# Patient Record
Sex: Female | Born: 1935 | Race: White | Hispanic: No | Smoking: Never smoker
Health system: Southern US, Community
[De-identification: ages and names within clinical notes are randomized; demographics above are authoritative.]

## PROBLEM LIST (undated history)

## (undated) DIAGNOSIS — A0472 Enterocolitis due to Clostridium difficile, not specified as recurrent: Secondary | ICD-10-CM

## (undated) DIAGNOSIS — E876 Hypokalemia: Secondary | ICD-10-CM

## (undated) DIAGNOSIS — R413 Other amnesia: Secondary | ICD-10-CM

## (undated) DIAGNOSIS — D518 Other vitamin B12 deficiency anemias: Secondary | ICD-10-CM

## (undated) DIAGNOSIS — B372 Candidiasis of skin and nail: Secondary | ICD-10-CM

## (undated) DIAGNOSIS — K5900 Constipation, unspecified: Secondary | ICD-10-CM

## (undated) DIAGNOSIS — R259 Unspecified abnormal involuntary movements: Secondary | ICD-10-CM

## (undated) DIAGNOSIS — S32030A Wedge compression fracture of third lumbar vertebra, initial encounter for closed fracture: Secondary | ICD-10-CM

## (undated) DIAGNOSIS — S32000A Wedge compression fracture of unspecified lumbar vertebra, initial encounter for closed fracture: Secondary | ICD-10-CM

## (undated) DIAGNOSIS — D126 Benign neoplasm of colon, unspecified: Secondary | ICD-10-CM

## (undated) DIAGNOSIS — D509 Iron deficiency anemia, unspecified: Secondary | ICD-10-CM

## (undated) DIAGNOSIS — H9190 Unspecified hearing loss, unspecified ear: Secondary | ICD-10-CM

## (undated) DIAGNOSIS — K219 Gastro-esophageal reflux disease without esophagitis: Secondary | ICD-10-CM

## (undated) DIAGNOSIS — K649 Unspecified hemorrhoids: Secondary | ICD-10-CM

## (undated) DIAGNOSIS — I10 Essential (primary) hypertension: Secondary | ICD-10-CM

## (undated) DIAGNOSIS — I442 Atrioventricular block, complete: Secondary | ICD-10-CM

## (undated) DIAGNOSIS — L409 Psoriasis, unspecified: Secondary | ICD-10-CM

## (undated) DIAGNOSIS — M899 Disorder of bone, unspecified: Secondary | ICD-10-CM

## (undated) DIAGNOSIS — M1990 Unspecified osteoarthritis, unspecified site: Secondary | ICD-10-CM

## (undated) DIAGNOSIS — Z8781 Personal history of (healed) traumatic fracture: Secondary | ICD-10-CM

## (undated) DIAGNOSIS — K317 Polyp of stomach and duodenum: Secondary | ICD-10-CM

## (undated) DIAGNOSIS — S335XXA Sprain of ligaments of lumbar spine, initial encounter: Secondary | ICD-10-CM

## (undated) DIAGNOSIS — R350 Frequency of micturition: Secondary | ICD-10-CM

## (undated) DIAGNOSIS — E785 Hyperlipidemia, unspecified: Secondary | ICD-10-CM

## (undated) DIAGNOSIS — M949 Disorder of cartilage, unspecified: Secondary | ICD-10-CM

## (undated) DIAGNOSIS — F419 Anxiety disorder, unspecified: Secondary | ICD-10-CM

## (undated) DIAGNOSIS — R609 Edema, unspecified: Secondary | ICD-10-CM

## (undated) DIAGNOSIS — K921 Melena: Secondary | ICD-10-CM

## (undated) HISTORY — DX: Psoriasis, unspecified: L40.9

## (undated) HISTORY — DX: Atrioventricular block, complete: I44.2

## (undated) HISTORY — PX: BREAST CYST ASPIRATION: SHX578

## (undated) HISTORY — DX: Hyperlipidemia, unspecified: E78.5

## (undated) HISTORY — DX: Disorder of bone, unspecified: M89.9

## (undated) HISTORY — DX: Other amnesia: R41.3

## (undated) HISTORY — DX: Anxiety disorder, unspecified: F41.9

## (undated) HISTORY — DX: Unspecified hearing loss, unspecified ear: H91.90

## (undated) HISTORY — DX: Hypokalemia: E87.6

## (undated) HISTORY — DX: Essential (primary) hypertension: I10

## (undated) HISTORY — DX: Iron deficiency anemia, unspecified: D50.9

## (undated) HISTORY — DX: Enterocolitis due to Clostridium difficile, not specified as recurrent: A04.72

## (undated) HISTORY — DX: Unspecified osteoarthritis, unspecified site: M19.90

## (undated) HISTORY — DX: Constipation, unspecified: K59.00

## (undated) HISTORY — DX: Benign neoplasm of colon, unspecified: D12.6

## (undated) HISTORY — DX: Edema, unspecified: R60.9

## (undated) HISTORY — PX: CATARACT EXTRACTION, BILATERAL: SHX1313

## (undated) HISTORY — DX: Candidiasis of skin and nail: B37.2

## (undated) HISTORY — DX: Unspecified abnormal involuntary movements: R25.9

## (undated) HISTORY — DX: Melena: K92.1

## (undated) HISTORY — PX: DILATION AND CURETTAGE OF UTERUS: SHX78

## (undated) HISTORY — DX: Wedge compression fracture of third lumbar vertebra, initial encounter for closed fracture: S32.030A

## (undated) HISTORY — DX: Other vitamin B12 deficiency anemias: D51.8

## (undated) HISTORY — DX: Personal history of (healed) traumatic fracture: Z87.81

## (undated) HISTORY — DX: Unspecified hemorrhoids: K64.9

## (undated) HISTORY — DX: Disorder of cartilage, unspecified: M94.9

## (undated) HISTORY — DX: Sprain of ligaments of lumbar spine, initial encounter: S33.5XXA

## (undated) HISTORY — DX: Polyp of stomach and duodenum: K31.7

## (undated) HISTORY — DX: Gastro-esophageal reflux disease without esophagitis: K21.9

## (undated) HISTORY — DX: Frequency of micturition: R35.0

---

## 1972-12-16 HISTORY — PX: ABDOMINAL HYSTERECTOMY: SHX81

## 1999-02-20 ENCOUNTER — Other Ambulatory Visit: Admission: RE | Admit: 1999-02-20 | Discharge: 1999-02-20 | Payer: Self-pay | Admitting: Family Medicine

## 1999-12-14 ENCOUNTER — Encounter: Admission: RE | Admit: 1999-12-14 | Discharge: 1999-12-14 | Payer: Self-pay | Admitting: Family Medicine

## 1999-12-14 ENCOUNTER — Encounter: Payer: Self-pay | Admitting: Family Medicine

## 2000-03-20 ENCOUNTER — Other Ambulatory Visit: Admission: RE | Admit: 2000-03-20 | Discharge: 2000-03-20 | Payer: Self-pay | Admitting: Family Medicine

## 2000-06-19 ENCOUNTER — Encounter: Payer: Self-pay | Admitting: Family Medicine

## 2000-06-19 ENCOUNTER — Encounter: Admission: RE | Admit: 2000-06-19 | Discharge: 2000-06-19 | Payer: Self-pay | Admitting: Family Medicine

## 2000-06-23 ENCOUNTER — Encounter: Admission: RE | Admit: 2000-06-23 | Discharge: 2000-06-23 | Payer: Self-pay | Admitting: Family Medicine

## 2000-06-23 ENCOUNTER — Encounter: Payer: Self-pay | Admitting: Family Medicine

## 2001-03-25 ENCOUNTER — Other Ambulatory Visit: Admission: RE | Admit: 2001-03-25 | Discharge: 2001-03-25 | Payer: Self-pay | Admitting: Family Medicine

## 2001-04-02 ENCOUNTER — Encounter: Admission: RE | Admit: 2001-04-02 | Discharge: 2001-04-02 | Payer: Self-pay | Admitting: Family Medicine

## 2001-04-02 ENCOUNTER — Encounter: Payer: Self-pay | Admitting: Family Medicine

## 2001-09-22 ENCOUNTER — Encounter: Payer: Self-pay | Admitting: Family Medicine

## 2001-09-22 ENCOUNTER — Encounter: Admission: RE | Admit: 2001-09-22 | Discharge: 2001-09-22 | Payer: Self-pay | Admitting: Family Medicine

## 2001-11-25 ENCOUNTER — Encounter: Admission: RE | Admit: 2001-11-25 | Discharge: 2001-11-25 | Payer: Self-pay | Admitting: Orthopedic Surgery

## 2001-11-25 ENCOUNTER — Encounter: Payer: Self-pay | Admitting: Orthopedic Surgery

## 2001-12-10 ENCOUNTER — Encounter: Admission: RE | Admit: 2001-12-10 | Discharge: 2002-01-29 | Payer: Self-pay | Admitting: Orthopedic Surgery

## 2001-12-16 LAB — HM COLONOSCOPY

## 2002-03-31 ENCOUNTER — Other Ambulatory Visit: Admission: RE | Admit: 2002-03-31 | Discharge: 2002-03-31 | Payer: Self-pay | Admitting: Family Medicine

## 2002-09-28 ENCOUNTER — Encounter: Payer: Self-pay | Admitting: Family Medicine

## 2002-09-28 ENCOUNTER — Encounter: Admission: RE | Admit: 2002-09-28 | Discharge: 2002-09-28 | Payer: Self-pay | Admitting: Family Medicine

## 2003-04-05 ENCOUNTER — Other Ambulatory Visit: Admission: RE | Admit: 2003-04-05 | Discharge: 2003-04-05 | Payer: Self-pay | Admitting: Family Medicine

## 2003-10-04 ENCOUNTER — Encounter: Admission: RE | Admit: 2003-10-04 | Discharge: 2003-10-04 | Payer: Self-pay | Admitting: Family Medicine

## 2003-10-04 ENCOUNTER — Encounter: Payer: Self-pay | Admitting: Family Medicine

## 2004-04-10 ENCOUNTER — Other Ambulatory Visit: Admission: RE | Admit: 2004-04-10 | Discharge: 2004-04-10 | Payer: Self-pay | Admitting: Family Medicine

## 2004-10-23 ENCOUNTER — Encounter: Admission: RE | Admit: 2004-10-23 | Discharge: 2004-10-23 | Payer: Self-pay | Admitting: Family Medicine

## 2004-10-25 ENCOUNTER — Encounter: Admission: RE | Admit: 2004-10-25 | Discharge: 2004-10-25 | Payer: Self-pay | Admitting: Family Medicine

## 2005-05-06 ENCOUNTER — Other Ambulatory Visit: Admission: RE | Admit: 2005-05-06 | Discharge: 2005-05-06 | Payer: Self-pay | Admitting: Family Medicine

## 2005-10-24 ENCOUNTER — Encounter: Admission: RE | Admit: 2005-10-24 | Discharge: 2005-10-24 | Payer: Self-pay | Admitting: Family Medicine

## 2005-12-16 HISTORY — PX: OTHER SURGICAL HISTORY: SHX169

## 2006-05-07 ENCOUNTER — Other Ambulatory Visit: Admission: RE | Admit: 2006-05-07 | Discharge: 2006-05-07 | Payer: Self-pay | Admitting: Family Medicine

## 2006-09-05 ENCOUNTER — Ambulatory Visit: Payer: Self-pay | Admitting: Internal Medicine

## 2006-10-29 ENCOUNTER — Ambulatory Visit: Payer: Self-pay | Admitting: Internal Medicine

## 2006-11-11 ENCOUNTER — Ambulatory Visit: Payer: Self-pay | Admitting: Internal Medicine

## 2006-11-12 ENCOUNTER — Encounter: Admission: RE | Admit: 2006-11-12 | Discharge: 2006-11-12 | Payer: Self-pay | Admitting: Internal Medicine

## 2006-11-18 ENCOUNTER — Encounter: Admission: RE | Admit: 2006-11-18 | Discharge: 2006-11-18 | Payer: Self-pay | Admitting: Internal Medicine

## 2006-11-18 ENCOUNTER — Encounter: Payer: Self-pay | Admitting: Internal Medicine

## 2006-11-26 ENCOUNTER — Ambulatory Visit: Payer: Self-pay | Admitting: Internal Medicine

## 2007-05-14 ENCOUNTER — Encounter: Payer: Self-pay | Admitting: Internal Medicine

## 2007-05-14 DIAGNOSIS — M949 Disorder of cartilage, unspecified: Secondary | ICD-10-CM

## 2007-05-14 DIAGNOSIS — M899 Disorder of bone, unspecified: Secondary | ICD-10-CM

## 2007-05-14 DIAGNOSIS — M1990 Unspecified osteoarthritis, unspecified site: Secondary | ICD-10-CM

## 2007-05-14 DIAGNOSIS — I10 Essential (primary) hypertension: Secondary | ICD-10-CM | POA: Insufficient documentation

## 2007-05-14 HISTORY — DX: Essential (primary) hypertension: I10

## 2007-05-14 HISTORY — DX: Unspecified osteoarthritis, unspecified site: M19.90

## 2007-05-14 HISTORY — DX: Disorder of bone, unspecified: M89.9

## 2007-07-08 ENCOUNTER — Ambulatory Visit: Payer: Self-pay | Admitting: Internal Medicine

## 2007-07-08 DIAGNOSIS — R259 Unspecified abnormal involuntary movements: Secondary | ICD-10-CM

## 2007-07-08 HISTORY — DX: Unspecified abnormal involuntary movements: R25.9

## 2007-07-08 LAB — CONVERTED CEMR LAB: Vit D, 1,25-Dihydroxy: 29 (ref 20–57)

## 2007-07-10 ENCOUNTER — Encounter: Payer: Self-pay | Admitting: Internal Medicine

## 2007-07-10 LAB — CONVERTED CEMR LAB
BUN: 15 mg/dL (ref 6–23)
Basophils Absolute: 0 10*3/uL (ref 0.0–0.1)
Basophils Relative: 0.3 % (ref 0.0–1.0)
CO2: 35 meq/L — ABNORMAL HIGH (ref 19–32)
Calcium: 9.6 mg/dL (ref 8.4–10.5)
Chloride: 100 meq/L (ref 96–112)
Cholesterol: 202 mg/dL (ref 0–200)
Creatinine, Ser: 0.6 mg/dL (ref 0.4–1.2)
Direct LDL: 126.3 mg/dL
Eosinophils Absolute: 0.2 10*3/uL (ref 0.0–0.6)
Eosinophils Relative: 2 % (ref 0.0–5.0)
GFR calc Af Amer: 124 mL/min
GFR calc non Af Amer: 102 mL/min
Glucose, Bld: 74 mg/dL (ref 70–99)
HCT: 42.8 % (ref 36.0–46.0)
HDL: 50.2 mg/dL (ref >39.0)
Hemoglobin: 14.5 g/dL (ref 12.0–15.0)
Lymphocytes Relative: 20.1 % (ref 12.0–46.0)
MCHC: 33.9 g/dL (ref 30.0–36.0)
MCV: 89.7 fL (ref 78.0–100.0)
Monocytes Absolute: 0.7 10*3/uL (ref 0.2–0.7)
Monocytes Relative: 8.7 % (ref 3.0–11.0)
Neutro Abs: 5.3 10*3/uL (ref 1.4–7.7)
Neutrophils Relative %: 68.9 % (ref 43.0–77.0)
Platelets: 212 10*3/uL (ref 150–400)
Potassium: 2.9 meq/L — ABNORMAL LOW (ref 3.5–5.1)
RBC: 4.78 M/uL (ref 3.87–5.11)
RDW: 12 % (ref 11.5–14.6)
Sodium: 143 meq/L (ref 135–145)
TSH: 0.8 microintl units/mL (ref 0.35–5.50)
Total CHOL/HDL Ratio: 4
Triglycerides: 197 mg/dL — ABNORMAL HIGH (ref 0–149)
VLDL: 39 mg/dL (ref 0–40)
WBC: 7.7 10*3/uL (ref 4.5–10.5)

## 2007-07-15 ENCOUNTER — Telehealth: Payer: Self-pay | Admitting: *Deleted

## 2007-07-16 ENCOUNTER — Telehealth: Payer: Self-pay | Admitting: *Deleted

## 2007-07-24 ENCOUNTER — Telehealth: Payer: Self-pay | Admitting: Internal Medicine

## 2007-08-13 ENCOUNTER — Ambulatory Visit: Payer: Self-pay | Admitting: Internal Medicine

## 2007-08-17 ENCOUNTER — Encounter: Payer: Self-pay | Admitting: Internal Medicine

## 2007-08-17 DIAGNOSIS — E876 Hypokalemia: Secondary | ICD-10-CM

## 2007-08-17 HISTORY — DX: Hypokalemia: E87.6

## 2007-08-18 LAB — CONVERTED CEMR LAB
BUN: 16 mg/dL (ref 6–23)
CO2: 33 meq/L — ABNORMAL HIGH (ref 19–32)
Calcium: 9.9 mg/dL (ref 8.4–10.5)
Chloride: 100 meq/L (ref 96–112)
Creatinine, Ser: 0.9 mg/dL (ref 0.4–1.2)
GFR calc Af Amer: 78 mL/min
GFR calc non Af Amer: 64 mL/min
Glucose, Bld: 108 mg/dL — ABNORMAL HIGH (ref 70–99)
Potassium: 3.3 meq/L — ABNORMAL LOW (ref 3.5–5.1)
Sodium: 139 meq/L (ref 135–145)

## 2007-09-17 ENCOUNTER — Ambulatory Visit: Payer: Self-pay | Admitting: Internal Medicine

## 2007-09-21 ENCOUNTER — Ambulatory Visit: Payer: Self-pay | Admitting: Family Medicine

## 2007-09-21 DIAGNOSIS — S335XXA Sprain of ligaments of lumbar spine, initial encounter: Secondary | ICD-10-CM

## 2007-09-21 HISTORY — DX: Sprain of ligaments of lumbar spine, initial encounter: S33.5XXA

## 2007-09-28 LAB — CONVERTED CEMR LAB
BUN: 11 mg/dL (ref 6–23)
CO2: 32 meq/L (ref 19–32)
Calcium: 9.4 mg/dL (ref 8.4–10.5)
Chloride: 102 meq/L (ref 96–112)
Creatinine, Ser: 0.9 mg/dL (ref 0.4–1.2)
GFR calc Af Amer: 78 mL/min
GFR calc non Af Amer: 64 mL/min
Glucose, Bld: 91 mg/dL (ref 70–99)
Magnesium: 1.9 mg/dL (ref 1.5–2.5)
Potassium: 3.6 meq/L (ref 3.5–5.1)
Sodium: 141 meq/L (ref 135–145)

## 2007-11-24 ENCOUNTER — Encounter: Admission: RE | Admit: 2007-11-24 | Discharge: 2007-11-24 | Payer: Self-pay | Admitting: Internal Medicine

## 2008-01-04 ENCOUNTER — Telehealth: Payer: Self-pay | Admitting: Internal Medicine

## 2008-01-06 ENCOUNTER — Ambulatory Visit: Payer: Self-pay | Admitting: Internal Medicine

## 2008-01-06 LAB — CONVERTED CEMR LAB: Vit D, 1,25-Dihydroxy: 36 (ref 30–89)

## 2008-01-11 LAB — CONVERTED CEMR LAB
BUN: 18 mg/dL (ref 6–23)
Basophils Absolute: 0.1 10*3/uL (ref 0.0–0.1)
Basophils Relative: 0.9 % (ref 0.0–1.0)
CO2: 32 meq/L (ref 19–32)
Calcium: 9.8 mg/dL (ref 8.4–10.5)
Chloride: 100 meq/L (ref 96–112)
Creatinine, Ser: 0.8 mg/dL (ref 0.4–1.2)
Eosinophils Absolute: 0.2 10*3/uL (ref 0.0–0.6)
Eosinophils Relative: 2.6 % (ref 0.0–5.0)
GFR calc Af Amer: 89 mL/min
GFR calc non Af Amer: 74 mL/min
Glucose, Bld: 77 mg/dL (ref 70–99)
HCT: 40 % (ref 36.0–46.0)
Hemoglobin: 13.9 g/dL (ref 12.0–15.0)
Lymphocytes Relative: 21.4 % (ref 12.0–46.0)
MCHC: 34.8 g/dL (ref 30.0–36.0)
MCV: 88.8 fL (ref 78.0–100.0)
Monocytes Absolute: 0.7 10*3/uL (ref 0.2–0.7)
Monocytes Relative: 10.1 % (ref 3.0–11.0)
Neutro Abs: 4.5 10*3/uL (ref 1.4–7.7)
Neutrophils Relative %: 65 % (ref 43.0–77.0)
Platelets: 182 10*3/uL (ref 150–400)
Potassium: 4.5 meq/L (ref 3.5–5.1)
RBC: 4.51 M/uL (ref 3.87–5.11)
RDW: 11.9 % (ref 11.5–14.6)
Sodium: 140 meq/L (ref 135–145)
Vitamin B-12: 217 pg/mL (ref 211–911)
WBC: 7 10*3/uL (ref 4.5–10.5)

## 2008-01-25 ENCOUNTER — Ambulatory Visit: Payer: Self-pay | Admitting: Internal Medicine

## 2008-01-25 DIAGNOSIS — K921 Melena: Secondary | ICD-10-CM | POA: Insufficient documentation

## 2008-01-25 HISTORY — DX: Melena: K92.1

## 2008-02-19 ENCOUNTER — Telehealth: Payer: Self-pay | Admitting: Internal Medicine

## 2008-02-29 ENCOUNTER — Ambulatory Visit: Payer: Self-pay | Admitting: Internal Medicine

## 2008-03-08 ENCOUNTER — Telehealth: Payer: Self-pay | Admitting: Internal Medicine

## 2008-03-08 ENCOUNTER — Ambulatory Visit: Payer: Self-pay | Admitting: Internal Medicine

## 2008-04-15 ENCOUNTER — Ambulatory Visit: Payer: Self-pay | Admitting: Internal Medicine

## 2008-04-22 ENCOUNTER — Ambulatory Visit (HOSPITAL_COMMUNITY): Admission: RE | Admit: 2008-04-22 | Discharge: 2008-04-22 | Payer: Self-pay | Admitting: Internal Medicine

## 2008-04-22 ENCOUNTER — Encounter: Payer: Self-pay | Admitting: Internal Medicine

## 2008-04-26 ENCOUNTER — Ambulatory Visit: Payer: Self-pay | Admitting: Internal Medicine

## 2008-04-26 DIAGNOSIS — R110 Nausea: Secondary | ICD-10-CM | POA: Insufficient documentation

## 2008-04-26 LAB — CONVERTED CEMR LAB: Vit D, 1,25-Dihydroxy: 46 (ref 30–89)

## 2008-04-28 ENCOUNTER — Ambulatory Visit: Payer: Self-pay | Admitting: Internal Medicine

## 2008-04-28 LAB — CONVERTED CEMR LAB
BUN: 15 mg/dL (ref 6–23)
CO2: 35 meq/L — ABNORMAL HIGH (ref 19–32)
Calcium: 9.6 mg/dL (ref 8.4–10.5)
Chloride: 105 meq/L (ref 96–112)
Creatinine, Ser: 0.9 mg/dL (ref 0.4–1.2)
GFR calc Af Amer: 77 mL/min
GFR calc non Af Amer: 64 mL/min
Glucose, Bld: 97 mg/dL (ref 70–99)
Potassium: 3.8 meq/L (ref 3.5–5.1)
Sodium: 143 meq/L (ref 135–145)

## 2008-08-19 ENCOUNTER — Ambulatory Visit: Payer: Self-pay | Admitting: Internal Medicine

## 2008-08-19 DIAGNOSIS — R350 Frequency of micturition: Secondary | ICD-10-CM | POA: Insufficient documentation

## 2008-08-19 HISTORY — DX: Frequency of micturition: R35.0

## 2008-08-19 LAB — CONVERTED CEMR LAB
Bilirubin Urine: NEGATIVE
Blood in Urine, dipstick: NEGATIVE
Glucose, Urine, Semiquant: NEGATIVE
Ketones, urine, test strip: NEGATIVE
Nitrite: NEGATIVE
Protein, U semiquant: NEGATIVE
Specific Gravity, Urine: 1.02
Urobilinogen, UA: 0.2
WBC Urine, dipstick: NEGATIVE
pH: 7

## 2008-08-23 LAB — CONVERTED CEMR LAB
BUN: 18 mg/dL (ref 6–23)
CO2: 35 meq/L — ABNORMAL HIGH (ref 19–32)
Calcium: 9.6 mg/dL (ref 8.4–10.5)
Chloride: 104 meq/L (ref 96–112)
Creatinine, Ser: 0.9 mg/dL (ref 0.4–1.2)
GFR calc Af Amer: 77 mL/min
GFR calc non Af Amer: 64 mL/min
Glucose, Bld: 90 mg/dL (ref 70–99)
Potassium: 3.8 meq/L (ref 3.5–5.1)
Sodium: 143 meq/L (ref 135–145)
TSH: 1.01 microintl units/mL (ref 0.35–5.50)

## 2008-09-27 ENCOUNTER — Ambulatory Visit: Payer: Self-pay | Admitting: Internal Medicine

## 2008-11-24 ENCOUNTER — Encounter: Admission: RE | Admit: 2008-11-24 | Discharge: 2008-11-24 | Payer: Self-pay | Admitting: Internal Medicine

## 2008-11-25 ENCOUNTER — Telehealth: Payer: Self-pay | Admitting: Family Medicine

## 2008-12-19 ENCOUNTER — Telehealth: Payer: Self-pay | Admitting: Internal Medicine

## 2008-12-21 ENCOUNTER — Ambulatory Visit: Payer: Self-pay | Admitting: Internal Medicine

## 2008-12-23 LAB — CONVERTED CEMR LAB
BUN: 18 mg/dL (ref 6–23)
CO2: 34 meq/L — ABNORMAL HIGH (ref 19–32)
Calcium: 9.9 mg/dL (ref 8.4–10.5)
Chloride: 100 meq/L (ref 96–112)
Creatinine, Ser: 1 mg/dL (ref 0.4–1.2)
GFR calc Af Amer: 69 mL/min
GFR calc non Af Amer: 57 mL/min
Glucose, Bld: 105 mg/dL — ABNORMAL HIGH (ref 70–99)
Potassium: 3.9 meq/L (ref 3.5–5.1)
Sodium: 140 meq/L (ref 135–145)

## 2009-02-21 ENCOUNTER — Ambulatory Visit: Payer: Self-pay | Admitting: Internal Medicine

## 2009-02-21 DIAGNOSIS — E785 Hyperlipidemia, unspecified: Secondary | ICD-10-CM

## 2009-02-21 HISTORY — DX: Hyperlipidemia, unspecified: E78.5

## 2009-03-09 LAB — CONVERTED CEMR LAB
ALT: 19 units/L (ref 0–35)
AST: 21 units/L (ref 0–37)
Albumin: 3.7 g/dL (ref 3.5–5.2)
Alkaline Phosphatase: 48 units/L (ref 39–117)
BUN: 18 mg/dL (ref 6–23)
Basophils Absolute: 0.1 10*3/uL (ref 0.0–0.1)
Basophils Relative: 0.8 % (ref 0.0–3.0)
Bilirubin, Direct: 0.1 mg/dL (ref 0.0–0.3)
CO2: 33 meq/L — ABNORMAL HIGH (ref 19–32)
Calcium: 9.6 mg/dL (ref 8.4–10.5)
Chloride: 102 meq/L (ref 96–112)
Cholesterol: 223 mg/dL (ref 0–200)
Creatinine, Ser: 1 mg/dL (ref 0.4–1.2)
Direct LDL: 137.7 mg/dL
Eosinophils Absolute: 0.3 10*3/uL (ref 0.0–0.7)
Eosinophils Relative: 3.7 % (ref 0.0–5.0)
Folate: 12.5 ng/mL
GFR calc Af Amer: 69 mL/min
GFR calc non Af Amer: 57 mL/min
Glucose, Bld: 98 mg/dL (ref 70–99)
HCT: 38.7 % (ref 36.0–46.0)
HDL: 55.6 mg/dL (ref >39.0)
Hemoglobin: 13.3 g/dL (ref 12.0–15.0)
Lymphocytes Relative: 21.2 % (ref 12.0–46.0)
MCHC: 34.4 g/dL (ref 30.0–36.0)
MCV: 88.6 fL (ref 78.0–100.0)
Monocytes Absolute: 0.6 10*3/uL (ref 0.1–1.0)
Monocytes Relative: 9.1 % (ref 3.0–12.0)
Neutro Abs: 4.4 10*3/uL (ref 1.4–7.7)
Neutrophils Relative %: 65.2 % (ref 43.0–77.0)
Platelets: 179 10*3/uL (ref 150–400)
Potassium: 3.5 meq/L (ref 3.5–5.1)
RBC: 4.37 M/uL (ref 3.87–5.11)
RDW: 12.1 % (ref 11.5–14.6)
Sodium: 143 meq/L (ref 135–145)
TSH: 1.38 microintl units/mL (ref 0.35–5.50)
Total Bilirubin: 0.8 mg/dL (ref 0.3–1.2)
Total CHOL/HDL Ratio: 4
Total Protein: 6.8 g/dL (ref 6.0–8.3)
Triglycerides: 142 mg/dL (ref 0–149)
VLDL: 28 mg/dL (ref 0–40)
Vitamin B-12: 173 pg/mL — ABNORMAL LOW (ref 211–911)
WBC: 6.8 10*3/uL (ref 4.5–10.5)

## 2009-03-13 ENCOUNTER — Ambulatory Visit: Payer: Self-pay | Admitting: Internal Medicine

## 2009-03-13 DIAGNOSIS — D518 Other vitamin B12 deficiency anemias: Secondary | ICD-10-CM

## 2009-03-13 HISTORY — DX: Other vitamin B12 deficiency anemias: D51.8

## 2009-03-20 ENCOUNTER — Ambulatory Visit: Payer: Self-pay | Admitting: Internal Medicine

## 2009-03-27 ENCOUNTER — Ambulatory Visit: Payer: Self-pay | Admitting: Internal Medicine

## 2009-04-03 ENCOUNTER — Ambulatory Visit: Payer: Self-pay | Admitting: Internal Medicine

## 2009-04-10 ENCOUNTER — Ambulatory Visit: Payer: Self-pay | Admitting: Internal Medicine

## 2009-04-17 ENCOUNTER — Ambulatory Visit: Payer: Self-pay | Admitting: Internal Medicine

## 2009-05-01 ENCOUNTER — Ambulatory Visit: Payer: Self-pay | Admitting: Internal Medicine

## 2009-05-16 ENCOUNTER — Ambulatory Visit: Payer: Self-pay | Admitting: Internal Medicine

## 2009-05-16 LAB — CONVERTED CEMR LAB: Vitamin B-12: 933 pg/mL — ABNORMAL HIGH (ref 211–911)

## 2009-05-23 ENCOUNTER — Ambulatory Visit: Payer: Self-pay | Admitting: Internal Medicine

## 2009-06-13 ENCOUNTER — Ambulatory Visit: Payer: Self-pay | Admitting: Internal Medicine

## 2009-07-11 ENCOUNTER — Ambulatory Visit: Payer: Self-pay | Admitting: Internal Medicine

## 2009-08-08 ENCOUNTER — Ambulatory Visit: Payer: Self-pay | Admitting: Internal Medicine

## 2009-09-05 ENCOUNTER — Ambulatory Visit: Payer: Self-pay | Admitting: Internal Medicine

## 2009-09-12 ENCOUNTER — Ambulatory Visit: Payer: Self-pay | Admitting: Internal Medicine

## 2009-10-03 ENCOUNTER — Ambulatory Visit: Payer: Self-pay | Admitting: Internal Medicine

## 2009-10-17 ENCOUNTER — Ambulatory Visit: Payer: Self-pay | Admitting: Internal Medicine

## 2009-10-17 LAB — CONVERTED CEMR LAB
BUN: 18 mg/dL (ref 6–23)
Basophils Absolute: 0 10*3/uL (ref 0.0–0.1)
Basophils Relative: 0.6 % (ref 0.0–3.0)
CO2: 33 meq/L — ABNORMAL HIGH (ref 19–32)
Calcium: 9.4 mg/dL (ref 8.4–10.5)
Chloride: 99 meq/L (ref 96–112)
Creatinine, Ser: 1.3 mg/dL — ABNORMAL HIGH (ref 0.4–1.2)
Eosinophils Absolute: 0.2 10*3/uL (ref 0.0–0.7)
Eosinophils Relative: 2.7 % (ref 0.0–5.0)
GFR calc non Af Amer: 41.73 mL/min (ref >60)
Glucose, Bld: 139 mg/dL — ABNORMAL HIGH (ref 70–99)
HCT: 38.9 % (ref 36.0–46.0)
Hemoglobin: 13.5 g/dL (ref 12.0–15.0)
Lymphocytes Relative: 18.1 % (ref 12.0–46.0)
Lymphs Abs: 1.2 10*3/uL (ref 0.7–4.0)
MCHC: 34.8 g/dL (ref 30.0–36.0)
MCV: 88.9 fL (ref 78.0–100.0)
Monocytes Absolute: 0.4 10*3/uL (ref 0.1–1.0)
Monocytes Relative: 5.8 % (ref 3.0–12.0)
Neutro Abs: 5 10*3/uL (ref 1.4–7.7)
Neutrophils Relative %: 72.8 % (ref 43.0–77.0)
Platelets: 196 10*3/uL (ref 150.0–400.0)
Potassium: 3.7 meq/L (ref 3.5–5.1)
RBC: 4.37 M/uL (ref 3.87–5.11)
RDW: 12.4 % (ref 11.5–14.6)
Sodium: 140 meq/L (ref 135–145)
Vitamin B-12: 659 pg/mL (ref 211–911)
WBC: 6.8 10*3/uL (ref 4.5–10.5)

## 2009-10-24 ENCOUNTER — Ambulatory Visit: Payer: Self-pay | Admitting: Internal Medicine

## 2009-11-02 ENCOUNTER — Ambulatory Visit: Payer: Self-pay | Admitting: Internal Medicine

## 2009-11-28 ENCOUNTER — Encounter: Admission: RE | Admit: 2009-11-28 | Discharge: 2009-11-28 | Payer: Self-pay | Admitting: Internal Medicine

## 2009-12-01 ENCOUNTER — Encounter (INDEPENDENT_AMBULATORY_CARE_PROVIDER_SITE_OTHER): Payer: Self-pay | Admitting: *Deleted

## 2009-12-04 ENCOUNTER — Ambulatory Visit: Payer: Self-pay | Admitting: Internal Medicine

## 2009-12-05 ENCOUNTER — Encounter: Admission: RE | Admit: 2009-12-05 | Discharge: 2009-12-05 | Payer: Self-pay | Admitting: Internal Medicine

## 2010-01-04 ENCOUNTER — Ambulatory Visit: Payer: Self-pay | Admitting: Internal Medicine

## 2010-02-01 ENCOUNTER — Ambulatory Visit: Payer: Self-pay | Admitting: Internal Medicine

## 2010-03-01 ENCOUNTER — Ambulatory Visit: Payer: Self-pay | Admitting: Internal Medicine

## 2010-03-27 ENCOUNTER — Ambulatory Visit: Payer: Self-pay | Admitting: Internal Medicine

## 2010-04-11 ENCOUNTER — Ambulatory Visit: Payer: Self-pay | Admitting: Internal Medicine

## 2010-04-16 LAB — CONVERTED CEMR LAB
ALT: 16 units/L (ref 0–35)
AST: 19 units/L (ref 0–37)
Albumin: 4 g/dL (ref 3.5–5.2)
Alkaline Phosphatase: 40 units/L (ref 39–117)
BUN: 15 mg/dL (ref 6–23)
Bilirubin, Direct: 0 mg/dL (ref 0.0–0.3)
CO2: 33 meq/L — ABNORMAL HIGH (ref 19–32)
Calcium: 9.5 mg/dL (ref 8.4–10.5)
Chloride: 101 meq/L (ref 96–112)
Cholesterol: 198 mg/dL (ref 0–200)
Creatinine, Ser: 1.1 mg/dL (ref 0.4–1.2)
GFR calc non Af Amer: 50.54 mL/min (ref >60)
Glucose, Bld: 93 mg/dL (ref 70–99)
HDL: 59.2 mg/dL (ref >39.00)
LDL Cholesterol: 109 mg/dL — ABNORMAL HIGH (ref 0–99)
Magnesium: 1.8 mg/dL (ref 1.5–2.5)
Potassium: 3.6 meq/L (ref 3.5–5.1)
Sodium: 141 meq/L (ref 135–145)
TSH: 1.12 microintl units/mL (ref 0.35–5.50)
Total Bilirubin: 0.6 mg/dL (ref 0.3–1.2)
Total CHOL/HDL Ratio: 3
Total Protein: 6.6 g/dL (ref 6.0–8.3)
Triglycerides: 150 mg/dL — ABNORMAL HIGH (ref 0.0–149.0)
VLDL: 30 mg/dL (ref 0.0–40.0)
Vitamin B-12: 1034 pg/mL — ABNORMAL HIGH (ref 211–911)

## 2010-05-08 ENCOUNTER — Ambulatory Visit: Payer: Self-pay | Admitting: Internal Medicine

## 2010-05-11 ENCOUNTER — Encounter: Payer: Self-pay | Admitting: Internal Medicine

## 2010-05-22 ENCOUNTER — Ambulatory Visit: Payer: Self-pay | Admitting: Internal Medicine

## 2010-06-19 ENCOUNTER — Ambulatory Visit: Payer: Self-pay | Admitting: Internal Medicine

## 2010-07-20 ENCOUNTER — Ambulatory Visit: Payer: Self-pay | Admitting: Internal Medicine

## 2010-08-17 ENCOUNTER — Ambulatory Visit: Payer: Self-pay | Admitting: Internal Medicine

## 2010-09-18 ENCOUNTER — Ambulatory Visit: Payer: Self-pay | Admitting: Internal Medicine

## 2010-10-16 ENCOUNTER — Ambulatory Visit: Payer: Self-pay | Admitting: Internal Medicine

## 2010-11-13 ENCOUNTER — Encounter: Payer: Self-pay | Admitting: Internal Medicine

## 2010-11-14 ENCOUNTER — Ambulatory Visit: Payer: Self-pay | Admitting: Internal Medicine

## 2010-11-16 ENCOUNTER — Telehealth: Payer: Self-pay | Admitting: *Deleted

## 2010-12-14 ENCOUNTER — Ambulatory Visit: Payer: Self-pay | Admitting: Internal Medicine

## 2010-12-14 LAB — CONVERTED CEMR LAB
Basophils Absolute: 0 10*3/uL (ref 0.0–0.1)
Eosinophils Relative: 2.4 % (ref 0.0–5.0)
Monocytes Relative: 10 % (ref 3.0–12.0)
Neutrophils Relative %: 64.2 % (ref 43.0–77.0)
Platelets: 195 10*3/uL (ref 150.0–400.0)
RDW: 13.4 % (ref 11.5–14.6)
WBC: 6.7 10*3/uL (ref 4.5–10.5)

## 2010-12-18 ENCOUNTER — Encounter
Admission: RE | Admit: 2010-12-18 | Discharge: 2010-12-18 | Payer: Self-pay | Source: Home / Self Care | Attending: Internal Medicine | Admitting: Internal Medicine

## 2010-12-20 ENCOUNTER — Ambulatory Visit
Admission: RE | Admit: 2010-12-20 | Discharge: 2010-12-20 | Payer: Self-pay | Source: Home / Self Care | Attending: Internal Medicine | Admitting: Internal Medicine

## 2010-12-20 DIAGNOSIS — M79609 Pain in unspecified limb: Secondary | ICD-10-CM | POA: Insufficient documentation

## 2011-01-15 NOTE — Consult Note (Signed)
Summary: [facility]  [facility]   Imported By: Maryln Gottron 05/17/2010 12:22:18  _____________________________________________________________________  External Attachment:    Type:   Image     Comment:   External Document

## 2011-01-15 NOTE — Assessment & Plan Note (Signed)
Summary: B12 INJ // RS   Nurse Visit   Allergies: No Known Drug Allergies  Medication Administration  Injection # 1:    Medication: Vit B12 1000 mcg    Diagnosis: ANEMIA, B12 DEFICIENCY (ICD-281.1)    Route: IM    Site: R deltoid    Exp Date: 03/16/2012    Lot #: [id number]    Mfr: [employer]    Patient tolerated injection without complications    Given by: Romualdo Bolk, CMA (AAMA) (September 18, 2010 11:37 AM)  Orders Added: 1)  Vit B12 1000 mcg [J3420] 2)  Admin of Therapeutic Inj  intramuscular or subcutaneous [[id number]]

## 2011-01-15 NOTE — Assessment & Plan Note (Signed)
Summary: b12 inj/acm   Nurse Visit   Allergies: No Known Drug Allergies  Medication Administration  Injection # 1:    Medication: Vit B12 1000 mcg    Diagnosis: ANEMIA, B12 DEFICIENCY (ICD-281.1)    Exp Date: 06/18/2012    Lot #: [id number]    Mfr: [employer]    Patient tolerated injection without complications    Given by: Lynann Beaver CMA AAMA (November 14, 2010 11:12 AM)  Orders Added: 1)  Admin of Therapeutic Inj  intramuscular or subcutaneous [[id number]]   Pt's neurologist would like her to double her dose of Inderal 60 mg XR and [facility]

## 2011-01-15 NOTE — Assessment & Plan Note (Signed)
Summary: b12 inj/njr   Nurse Visit   Allergies: No Known Drug Allergies  Medication Administration  Injection # 1:    Medication: Vit B12 1000 mcg    Diagnosis: ANEMIA, B12 DEFICIENCY (ICD-281.1)    Route: IM    Site: R deltoid    Exp Date: 03/16/2012    Lot #: [id number]    Mfr: [employer]    Patient tolerated injection without complications    Given by: Romualdo Bolk, CMA (AAMA) (August 17, 2010 10:26 AM)  Orders Added: 1)  Flu Vaccine 50yrs + [47829] 2)  Administration Flu vaccine - MCR [G0008] 3)  Vit B12 1000 mcg [J3420] 4)  Admin of Therapeutic Inj  intramuscular or subcutaneous [[id number]]     Flu Vaccine Consent Questions     Do you have a history of severe allergic reactions to this vaccine? no    Any prior history of allergic reactions to egg and/or gelatin? no    Do you have a sensitivity to the preservative Thimersol? no    Do you have a past history of Guillan-Barre Syndrome? no    Do you currently have an acute febrile illness? no    Have you ever had a severe reaction to latex? no    Vaccine information given and explained to patient? yes    Are you currently pregnant? no    Lot Number:[id number]   Exp Date:06/15/2011   Site Given  Left Deltoid IM

## 2011-01-15 NOTE — Assessment & Plan Note (Signed)
Summary: 2 month rov/njr   Vital Signs:  Patient profile:   77 year old female Menstrual status:  hysterectomy Weight:      137 pounds Pulse rate:   66 / minute BP sitting:   150 / 70  (left arm) Cuff size:   regular  Vitals Entered By: Romualdo Bolk, CMA (AAMA) (May 22, 2010 10:21 AM) CC: follow-up visit, Hypertension Management   History of Present Illness: Kimberly Silva comes in today  for follow up of a number of problems. HT: no se of med but tends to run 150 range and.  NEURO: has seen neuto regarding her tremors and invluntary movements and not felt to be parkinsons at present  but to follow.    Pt ? if a different med such as inderal could help her tremor.  GI: soe better  potassium gave se.  B12 defic   on shots and doing well with this.  Constipation GI NAusea  : no change  OA no change  Hypertension History:      She complains of neurologic problems and syncope, but denies headache, chest pain, palpitations, dyspnea with exertion, orthopnea, PND, peripheral edema, visual symptoms, and side effects from treatment.  She notes no problems with any antihypertensive medication side effects.  Tremors, some lightheadness this am.        Positive major cardiovascular risk factors include female age 68 years old or older, hyperlipidemia, and hypertension.  Negative major cardiovascular risk factors include non-tobacco-user status.     Preventive Screening-Counseling & Management  Alcohol-Tobacco     Alcohol drinks/day: 0     Smoking Status: never  Caffeine-Diet-Exercise     Caffeine use/day: 1 coke a day     Does Patient Exercise: no  Current Medications (verified): 1)  Aspir-81 81 Mg Tbec (Aspirin) .... Take 1 Tablet By Mouth Once A Day 2)  Triamterene-Hctz 37.5-25 Mg  Tabs (Triamterene-Hctz) .Marland Kitchen.. 1 By Mouth Once Daily 3)  Timolol Maleate 10 Mg Tabs (Timolol Maleate) .Marland Kitchen.. 1 By Mouth Two Times A Day 4)  Metamucil Plus Calcium   Caps (Psyllium-Calcium) 5)  Cvs  Stool Softener 100 Mg  Caps (Docusate Sodium) 6)  Clobex 0.05 % Lotn (Clobetasol Propionate) 7)  Cyanocobalamin 1000 Mcg/ml Soln (Cyanocobalamin) 8)  Prilosec Otc 20 Mg Tbec (Omeprazole Magnesium) .Marland Kitchen.. 1 By Mouth Once Daily 9)  Vitamin D 1000 Unit  Tabs (Cholecalciferol)  Allergies (verified): No Known Drug Allergies  Past History:  Past medical, surgical, family and social histories (including risk factors) reviewed, and no changes noted (except as noted below).  Past Medical History: Hypertension Osteoarthritis Osteopenia  dexa ? 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 Hysterectomy  Past History:  Care Management: Gastroenterology: Dr. Lina Sar Dermatology: [facility] 2010/05/13  Family History: Reviewed history from 04/26/2008 and no changes required. Parkinson's disease in a brother peripheral vascular disease in a sibling Family History of Colon CA 1st degree relative sister  Family History Breast cancer 1st degree relative sisters x3 Family History Hypertension CAD, CVA mom  Social History: Reviewed history from 02/21/2009 and no changes required. Never Smoked  Husband 2008-05-13 died in Hospice care. 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         no falls  depression or dementia symptom   Physical Exam  General:  alert, well-developed, well-nourished, and well-hydrated.   Head:  normocephalic and atraumatic.   Eyes:  glasses  Mouth:  pharynx pink and moist.   Neck:  No deformities, masses, or tenderness noted. Lungs:  Normal respiratory effort,  chest expands symmetrically. Lungs are clear to auscultation, no crackles or wheezes.no dullness.   Heart:  Normal rate and regular rhythm. S1 and S2 normal without gallop, murmur, click, rub or other extra sounds. Abdomen:  Bowel sounds positive,abdomen soft and non-tender without masses, organomegaly or   noted. Msk:  no joint swelling and no joint warmth.  oa changes  Neurologic:  alert & oriented X3.  blanace seems good  no enmass turning   trmor lips and hands   no focal weakness or cogwheeling Skin:  turgor normal, color normal, and no petechiae.   Cervical Nodes:  No lymphadenopathy noted Psych:  Oriented X3, normally interactive, good eye contact, not anxious appearing, and not depressed appearing.   reviewed consult note  Impression & Recommendations:  Problem # 1:  HYPERTENSION (ICD-401.9)  could be better and  disc option of trying a different b blocker  and adjust to see iof copntrols HT and  the tremors  . consider other options as appropriate .  Want to avoid orthostatic  dizziness  Her updated medication list for this problem includes:    Triamterene-hctz 37.5-25 Mg Tabs (Triamterene-hctz) .Marland Kitchen... 1 by mouth once daily    Timolol Maleate 10 Mg Tabs (Timolol maleate) .Marland Kitchen... 1 by mouth two times a day    Propranolol Hcl Cr 60 Mg Xr24h-cap (Propranolol hcl) .Marland Kitchen... 1 by mouth once daily  for high blood pressure and tremor  Orders: Prescription Created Electronically [phone])  Problem # 2:  ANEMIA, B12 DEFICIENCY (ICD-281.1) stable Her updated medication list for this problem includes:    Cyanocobalamin 1000 Mcg/ml Soln (Cyanocobalamin)  Problem # 3:  TREMOR (ICD-781.0)  see neuro consult about prob not parkinsons   to follow .   pt relieved at this point.   she can discuss signs trials when foes back for follow up . in the meantime will try inderal.   Orders: Prescription Created Electronically [phone])  Problem # 4:  OSTEOARTHRITIS (ICD-715.90) Assessment: Unchanged  Her  updated medication list for this problem includes:    Aspir-81 81 Mg Tbec (Aspirin) .Marland Kitchen... Take 1 tablet by mouth once a day  Complete Medication List: 1)  Aspir-81 81 Mg Tbec (Aspirin) .... Take 1 tablet by mouth once a day 2)  Triamterene-hctz 37.5-25 Mg Tabs (Triamterene-hctz) .Marland Kitchen.. 1 by mouth once daily 3)  Timolol Maleate 10 Mg Tabs (Timolol maleate) .Marland Kitchen.. 1 by mouth two times a day 4)  Metamucil Plus Calcium Caps (Psyllium-calcium) 5)  Cvs Stool Softener 100 Mg Caps (Docusate sodium) 6)  Clobex 0.05 % Lotn (Clobetasol propionate) 7)  Cyanocobalamin 1000 Mcg/ml Soln (Cyanocobalamin) 8)  Prilosec Otc 20 Mg Tbec (Omeprazole magnesium) .Marland Kitchen.. 1 by mouth once daily 9)  Vitamin D 1000 Unit Tabs (Cholecalciferol) 10)  Propranolol Hcl Cr 60 Mg Xr24h-cap (Propranolol hcl) .Marland Kitchen.. 1 by mouth once daily  for high blood pressure and tremor  Hypertension Assessment/Plan:      The patient's hypertensive risk group is category B: At least one risk factor (excluding diabetes) with no target organ damage.  Her  calculated 10 year risk of coronary heart disease is 13 %.  Today's blood pressure is 150/70.  Her blood pressure goal is < 140/90.  Patient Instructions: 1)  stop timolol and begin propranolol LA   2)  monitor BP readings 3)  ROV  in month can do b12 shot then and recheck BP  and tremor. 4)  Call in mean time with concerns. Prescriptions: PROPRANOLOL HCL CR 60 MG XR24H-CAP (PROPRANOLOL HCL) 1 by mouth once daily  for high blood pressure and tremor  #30 x 3   Entered and Authorized by:   [name]   Signed by:   [name] on 05/22/2010   Method used:   Electronically to        [id number]       [address]       [place], [place]  [address]       Ph: [phone] or [phone]       Fax: [phone]   RxID:   [id number]

## 2011-01-15 NOTE — Assessment & Plan Note (Signed)
Summary: b12 inj//ccm   Nurse Visit   Allergies: No Known Drug Allergies  Medication Administration  Injection # 1:    Medication: Vit B12 1000 mcg    Diagnosis: ANEMIA, B12 DEFICIENCY (ICD-281.1)    Route: IM    Site: L deltoid    Exp Date: 7/13    Lot #: [id number]    Mfr: [employer]    Patient tolerated injection without complications    Given by: Josph Macho RMA (October 16, 2010 11:06 AM)  Orders Added: 1)  Vit B12 1000 mcg [J3420] 2)  Admin of Therapeutic Inj  intramuscular or subcutaneous [[id number]]

## 2011-01-15 NOTE — Progress Notes (Signed)
Summary: Propranolol increase recommended  Phone Note Call from Patient Call back at Home Phone [phone]   Caller: Patient Call For: [name] Summary of Call: Pt called and saw the Neurologist today and he recommended the Propranolol 60 mg be increased to two times a day.  He thinks it would help with her tremmors.  If approved please chg sig and send new [facility] as she needs a refill  Initial call taken by: Sid Falcon LPN,  November 16, 2010 3:12 PM  Follow-up for Phone Call        she is on a n extended release med so usually take at the same time of day  once a day and increasing to 120 may be too much at one time   so increase to 80 mg  24 hour xl by mouth  1 by mouth once daily disp 30  and after 1 month call and we can increase to 120 per day if needed.     Propranolol 80 mg 24 XR 1 by mouth once daily disp 30 #  Follow-up by: [name],  November 16, 2010 5:46 PM  Additional Follow-up for Phone Call Additional follow up Details #1::        Pt aware and rx sent to pharmacy Additional Follow-up by: Romualdo Bolk, CMA (AAMA),  November 19, 2010 4:06 PM    New/Updated Medications: PROPRANOLOL HCL CR 80 MG XR24H-CAP (PROPRANOLOL HCL) 1 by mouth once daily Prescriptions: PROPRANOLOL HCL CR 80 MG XR24H-CAP (PROPRANOLOL HCL) 1 by mouth once daily  #30 x 1   Entered by:   Romualdo Bolk, CMA (AAMA)   Authorized by:   [name]   Signed by:   Romualdo Bolk, CMA (AAMA) on 11/19/2010   Method used:   Electronically to        [id number]       [address]       [place], [place]  [address]       Ph: [phone] or [phone]       Fax: [phone]   RxID:   [id number]

## 2011-01-15 NOTE — Letter (Signed)
Summary: [facility]  [facility]   Imported By: Maryln Gottron 11/16/2010 13:41:40  _____________________________________________________________________  External Attachment:    Type:   Image     Comment:   External Document

## 2011-01-15 NOTE — Assessment & Plan Note (Signed)
Summary: b12 inj/;/ccm   Nurse Visit   Allergies: No Known Drug Allergies  Medication Administration  Injection # 1:    Medication: Vit B12 1000 mcg    Diagnosis: ANEMIA, B12 DEFICIENCY (ICD-281.1)    Route: IM    Site: L deltoid    Exp Date: 08/17/2011    Lot #: [id number]    Mfr: [employer]    Patient tolerated injection without complications    Given by: Romualdo Bolk, CMA (AAMA) (May 08, 2010 11:14 AM)

## 2011-01-15 NOTE — Assessment & Plan Note (Signed)
Summary: b12 inj/njr   Nurse Visit   Allergies: No Known Drug Allergies  Medication Administration  Injection # 1:    Medication: Vit B12 1000 mcg    Diagnosis: ANEMIA, B12 DEFICIENCY (ICD-281.1)    Route: IM    Site: L deltoid    Exp Date: 08/17/2011    Lot #: [id number]    Mfr: [employer]    Patient tolerated injection without complications    Given by: Romualdo Bolk, CMA (AAMA) (February 01, 2010 11:08 AM)  Orders Added: 1)  Vit B12 1000 mcg [J3420] 2)  Admin of Therapeutic Inj  intramuscular or subcutaneous [[id number]]

## 2011-01-15 NOTE — Assessment & Plan Note (Signed)
Summary: b-12 inj/cjr   Nurse Visit   Allergies: No Known Drug Allergies  Medication Administration  Injection # 1:    Medication: Vit B12 1000 mcg    Diagnosis: ANEMIA, B12 DEFICIENCY (ICD-281.1)    Route: SQ    Site: R deltoid    Exp Date: 08/21/2011    Lot #: [id number]    Mfr: [employer]    Patient tolerated injection without complications    Given by: Lynann Beaver CMA (March 01, 2010 11:14 AM)  Orders Added: 1)  Admin of Therapeutic Inj  intramuscular or subcutaneous [[id number]]

## 2011-01-15 NOTE — Assessment & Plan Note (Signed)
Summary: b-12 inj/cjr   Nurse Visit   Allergies: No Known Drug Allergies  Medication Administration  Injection # 1:    Medication: Vit B12 1000 mcg    Diagnosis: ANEMIA, B12 DEFICIENCY (ICD-281.1)    Route: IM    Site: R deltoid    Exp Date: 08/17/2011    Lot #: [id number]    Mfr: [employer]    Patient tolerated injection without complications    Given by: Romualdo Bolk, CMA (AAMA) (July 20, 2010 10:09 AM)  Orders Added: 1)  Vit B12 1000 mcg [J3420] 2)  Admin of Therapeutic Inj  intramuscular or subcutaneous [[id number]]

## 2011-01-15 NOTE — Assessment & Plan Note (Signed)
Summary: 5 mo rov/mm   Vital Signs:  Patient profile:   77 year old female Menstrual status:  hysterectomy Height:      61 inches Weight:      138 pounds BMI:     26.17 Pulse rate:   66 / minute BP sitting:   110 / 70  (right arm) Cuff size:   regular  Vitals Entered By: Romualdo Bolk, CMA (AAMA) (March 27, 2010 11:19 AM)  Serial Vital Signs/Assessments:  Time      Position  BP       Pulse  Resp  Temp     By           R Arm     150/80                         [name]           L Arm     154/78                         [name]  Comments: reg cuff sitting By: [name]   CC: follow-up visit, Hypertension Management   History of Present Illness: Kimberly Silva comesin for follow up of multiple medical problems .   Tremors :about the  same? no dementia  but now ready to go tosee neurologist .  No falling  . new balance problems . no visino changes  hearing is down some  but functioning ok . cost is an issues . B12  : still on monthly shots and doing much better after this  . BP   sometimes over 150   no se of meds so far  taking timolol 10 and 5  ( has been on for a while) . Gi taking prilosec  before breakfast.   ? if    helping   ? if stress   from nausea . Not that bad  unsure what to do.   Decrease wish to cook.      doesn really have Hb vomiting or weight loss or pain  thinks the calcium causes this   had been given reglan by dr Juanda Chance but didnt take it because of fear of  motor se.   Hypertension History:      She denies headache, chest pain, palpitations, dyspnea with exertion, orthopnea, PND, peripheral edema, visual symptoms, neurologic problems, syncope, and side effects from treatment.  She notes no problems with any antihypertensive medication side effects.        Positive major cardiovascular risk factors include female age 26 years old or older, hyperlipidemia, and hypertension.  Negative major cardiovascular risk factors include  non-tobacco-user status.     Preventive Screening-Counseling & Management  Alcohol-Tobacco     Alcohol drinks/day: 0     Smoking Status: never  Caffeine-Diet-Exercise     Caffeine use/day: 1 coke a day     Does Patient Exercise: no  Current Medications (verified): 1)  Aspir-81 81 Mg Tbec (Aspirin) .... Take 1 Tablet By Mouth Once A Day 2)  Triamterene-Hctz 37.5-25 Mg  Tabs (Triamterene-Hctz) .Marland Kitchen.. 1 By Mouth Once Daily 3)  Timolol Maleate 10 Mg Tabs (Timolol Maleate) .Marland Kitchen.. 1 and 1/2 By Mouth Once Daily 4)  Metamucil Plus Calcium   Caps (Psyllium-Calcium) 5)  Cvs Stool Softener 100 Mg  Caps (Docusate Sodium) 6)  Citracal + D 315-200 Mg-Unit  Tabs (Calcium Citrate-Vitamin D) .Marland Kitchen.. 1 Once Daily 7)  Clobex 0.05 % Lotn (Clobetasol Propionate) 8)  Cyanocobalamin 1000 Mcg/ml Soln (Cyanocobalamin) 9)  Prilosec Otc 20 Mg Tbec (Omeprazole Magnesium) .Marland Kitchen.. 1 By Mouth Once Daily  Allergies (verified): No Known Drug Allergies  Past History:  Past medical, surgical, family and social histories (including risk factors) reviewed, and no changes noted (except as noted below).  Past Medical History: Reviewed history from 02/21/2009 and no changes required. Hypertension Osteoarthritis Osteopenia  dexa ? May 10, 2006 hx of fosamax use  history of fractured left ankle g7p5   Pan endoscopy 05/10/2008  gatric polyp   Past Surgical History: Reviewed history from 05/14/2007 and no changes required. Hysterectomy  Past History:  Care Management: Gastroenterology: Dr. Lina Sar Dermatology: Nicholas Lose  Family History: Reviewed history from 04/26/2008 and no changes required. Parkinson's disease in a brother peripheral vascular disease in a sibling Family History of Colon CA 1st degree relative sister  Family History Breast cancer 1st degree relative sisters x3 Family History Hypertension CAD, CVA mom  Social History: Reviewed history from 02/21/2009 and no changes required. Never Smoked  Husband 05/10/08  died in Hospice care. 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  The patient denies fever, weight loss, vision loss, chest pain, syncope, dyspnea on exertion, peripheral edema, prolonged cough, abdominal pain, melena, hematochezia, severe indigestion/heartburn, muscle weakness, unusual weight change, abnormal bleeding, enlarged lymph nodes, and angioedema.    Physical Exam  General:  alert, well-developed, well-nourished, and well-hydrated.   Head:  normocephalic and atraumatic.   Eyes:  vision grossly intact.   Ears:  R ear normal and L ear normal.   some wax in right eac tm visualized  Nose:  no nasal discharge.   Mouth:  pharynx pink and moist.   Neck:  No deformities, masses, or tenderness noted. Lungs:  Normal respiratory effort, chest expands symmetrically. Lungs are clear to auscultation, no crackles or wheezes.no dullness.   Heart:  Normal rate and regular rhythm. S1 and S2 normal without gallop, murmur, click, rub or other extra sounds. Abdomen:  Bowel sounds positive,abdomen soft and non-tender without masses, organomegaly or   noted. Msk:  no joint swelling and no joint warmth.  oa changes  Pulses:  pulses intact without delay   Extremities:  no clubbing cyanosis or edema  Neurologic:  alert & oriented X3 and strength normal in all extremities.  slightly flat footed gait arises from chair and table without problesm   tremor lips mouth and some hands  at rest    ? no cogwheeling   can do serial 7s   Skin:  turgor normal, color normal, and no petechiae.   Cervical Nodes:  No lymphadenopathy noted Psych:  Oriented X3, normally interactive, good eye contact, not anxious appearing, and not depressed appearing.     Impression & Recommendations:  Problem # 1:  HYPERTENSION (ICD-401.9)  up today    and at home  if consistently up can increase dosing and then follow up  Her updated medication list for this problem  includes:    Triamterene-hctz 37.5-25 Mg Tabs (Triamterene-hctz) .Marland Kitchen... 1 by mouth once daily    Timolol Maleate 10 Mg Tabs (Timolol maleate) .Marland Kitchen... 1 by mouth two times a day  Orders: Prescription Created Electronically [phone])  Problem # 2:  ANEMIA, B12 DEFICIENCY (ICD-281.1)  Her updated medication list for this problem includes:  Cyanocobalamin 1000 Mcg/ml Soln (Cyanocobalamin)  Orders: Vit B12 1000 mcg (J3420) Admin of Therapeutic Inj  intramuscular or subcutaneous (47829)  Problem # 3:  NAUSEA (ICD-787.02) ongoing  ? from meds or motiity problem    disc options  for now will just dc calcium supplements   Problem # 4:  TREMOR (ICD-781.0)  motor movements  . now is ready to get neuro consultation  ? if early parkinsons vs other benign process.   Orders: Neurology Referral (Neuro)  Problem # 5:  OSTEOARTHRITIS (ICD-715.90) Assessment: Comment Only  Her updated medication list for this problem includes:    Aspir-81 81 Mg Tbec (Aspirin) .Marland Kitchen... Take 1 tablet by mouth once a day  Complete Medication List: 1)  Aspir-81 81 Mg Tbec (Aspirin) .... Take 1 tablet by mouth once a day 2)  Triamterene-hctz 37.5-25 Mg Tabs (Triamterene-hctz) .Marland Kitchen.. 1 by mouth once daily 3)  Timolol Maleate 10 Mg Tabs (Timolol maleate) .Marland Kitchen.. 1 by mouth two times a day 4)  Metamucil Plus Calcium Caps (Psyllium-calcium) 5)  Cvs Stool Softener 100 Mg Caps (Docusate sodium) 6)  Citracal + D 315-200 Mg-unit Tabs (Calcium citrate-vitamin d) .Marland Kitchen.. 1 once daily 7)  Clobex 0.05 % Lotn (Clobetasol propionate) 8)  Cyanocobalamin 1000 Mcg/ml Soln (Cyanocobalamin) 9)  Prilosec Otc 20 Mg Tbec (Omeprazole magnesium) .Marland Kitchen.. 1 by mouth once daily  Hypertension Assessment/Plan:      The patient's hypertensive risk group is category B: At least one risk factor (excluding diabetes) with no target organ damage.  Today's blood pressure is 110/70.  Her blood pressure goal is < 140/90.  Patient Instructions: 1)  will do  neurology consult  2)  increase timolol to 1 two times a day  and monitor Bp readings. 3)  schedule for fasting labs  4)  BMP, VIt b12 , MMA,  lipid lfts , tsh ,   Magnesium ) 5)  (vit b12 deficiency , 401.9,  nausea, 6)  You will be informed of lab results when available.  7)  return office visit in 2 months or as needed. with bp readings .  Prescriptions: TIMOLOL MALEATE 10 MG TABS (TIMOLOL MALEATE) 1 by mouth two times a day  #60 x 6   Entered and Authorized by:   [name]   Signed by:   [name] on 03/27/2010   Method used:   Electronically to        [id number] (retail)       [address]       [place], [place]  [address]       Ph: [phone] or [phone]       Fax: [phone]   RxID:   [id number]      Medication Administration  Injection # 1:    Medication: Vit B12 1000 mcg    Diagnosis: ANEMIA, B12 DEFICIENCY (ICD-281.1)    Route: IM    Site: R deltoid    Exp Date: 08/17/2011    Lot #: [id number]    Mfr: American Regent    Patient tolerated injection without complications    Given by: Romualdo Bolk, CMA (AAMA) (March 27, 2010 11:25 AM)  Orders Added: 1)  Vit B12 1000 mcg [J3420] 2)  Admin of Therapeutic Inj  intramuscular or subcutaneous [96372] 3)  Prescription Created Electronically [G8553] 4)  Est. Patient Level IV [32951] 5)  Neurology Referral [Neuro]

## 2011-01-15 NOTE — Assessment & Plan Note (Signed)
Summary: b-12 inj/mm/pt rsc/cjr   Nurse Visit   Allergies: No Known Drug Allergies  Medication Administration  Injection # 1:    Medication: Vit B12 1000 mcg    Diagnosis: ANEMIA, B12 DEFICIENCY (ICD-281.1)    Route: IM    Site: R deltoid    Exp Date: 08/17/2011    Lot #: [id number]    Mfr: American Regent    Patient tolerated injection without complications    Given by: Romualdo Bolk, CMA (AAMA) (January 04, 2010 10:04 AM)  Orders Added: 1)  Vit B12 1000 mcg [J3420] 2)  Admin of Therapeutic Inj  intramuscular or subcutaneous [16606]

## 2011-01-15 NOTE — Assessment & Plan Note (Signed)
Summary: b12 inj/ 1 month rov/njr   Vital Signs:  Patient profile:   77 year old female Menstrual status:  hysterectomy Weight:      135 pounds Pulse rate:   68 / minute Pulse rhythm:   regular BP sitting:   134 / 70  (left arm) Cuff size:   regular  Vitals Entered By: Raechel Ache, RN (June 19, 2010 2:45 PM)  Serial Vital Signs/Assessments:  Time      Position  BP       Pulse  Resp  Temp     By           R Arm     124/70                         Kimberly Silva           L Arm     122/72                         Kimberly Silva  CC: 1 mo ROV   History of Present Illness: Kimberly Silva comes in   today  for above  after changing medications rom timolol to inderal for Bp and poss help with tremor .   Bp : Some better except  in am .   [facility] home [facility]  [facility]   No se of med noted  Tremor:  no change  but less anxious.   has appat with neuro soon  B12 feels better  since has been on  shots   Preventive Screening-Counseling & Management  Alcohol-Tobacco     Alcohol drinks/day: 0     Smoking Status: never  Caffeine-Diet-Exercise     Caffeine use/day: 1 coke a day     Does Patient Exercise: no  Allergies: No Known Drug Allergies  Past History:  Past medical, surgical, family and social histories (including risk factors) reviewed, and no changes noted (except as noted below).  Past Medical History: Reviewed history from 05/22/2010 and no changes required. Hypertension Osteoarthritis Osteopenia  dexa ? 2006-05-12 hx of fosamax use  history of fractured left ankle g7p5   Pan endoscopy 2008/05/12  gatric polyp  Tremors  Past Surgical History: Reviewed history from 05/14/2007 and no changes required. Hysterectomy  Family History: Reviewed history from 04/26/2008 and no changes required. Parkinson's disease in a brother peripheral vascular disease in a sibling Family History of Colon CA 1st degree relative Kimberly Silva  Family History Breast cancer 1st degree  relative sisters x3 Family History Hypertension CAD, CVA Kimberly Silva  Social History: Reviewed history from 02/21/2009 and no changes required. Never Smoked  Kimberly Silva 2008/05/12 died in Hospice care. leukemia She was caretaker Lives by herself    Review of Systems  The patient denies anorexia, fever, peripheral edema, prolonged cough, abdominal pain, difficulty walking, abnormal bleeding, enlarged lymph nodes, and angioedema.    Physical Exam  General:  Well-developed,well-nourished,in no acute distress; alert,appropriate and cooperative throughout examination tremor  lower lip Head:  normocephalic and atraumatic.   Eyes:  vision grossly intact.   Neck:  No deformities, masses, or tenderness noted. Lungs:  normal respiratory effort and no intercostal retractions.   Heart:  normal rate and regular rhythm.   Msk:  OA changes Pulses:  pulses intact without delay   Neurologic:  alert & oriented X3.  good balance   non focal  no change in tremor  Skin:  turgor normal, color normal, no ecchymoses, and no petechiae.   Cervical Nodes:  No lymphadenopathy noted Psych:  Oriented X3, normally interactive, good eye contact, not anxious appearing, and not depressed appearing.     Impression & Recommendations:  Problem # 1:  HYPERTENSION (ICD-401.9) Assessment Improved better    improved  on new med and  BP today: 134/70 Prior BP: 150/70 (05/22/2010)  Prior 10 Yr Risk Heart Disease: 13 % (05/22/2010)  Labs Reviewed: K+: 3.6 (04/11/2010) Creat: : 1.1 (04/11/2010)   Chol: 198 (04/11/2010)   HDL: 59.20 (04/11/2010)   LDL: 109 (04/11/2010)   TG: 150.0 (04/11/2010)  Problem # 2:  TREMOR (ICD-781.0) no change   Problem # 3:  ANEMIA, B12 DEFICIENCY (ICD-281.1) Assessment: Improved  continue feels better on these   last levew was up cause med given before blood tests.   Her updated medication list for this problem includes:    Cyanocobalamin 1000 Mcg/ml Soln (Cyanocobalamin)  Complete Medication  List: 1)  Aspir-81 81 Mg Tbec (Aspirin) .... Take 1 tablet by mouth once a day 2)  Triamterene-hctz 37.5-25 Mg Tabs (Triamterene-hctz) .Marland Kitchen.. 1 by mouth once daily 3)  Timolol Maleate 10 Mg Tabs (Timolol maleate) .Marland Kitchen.. 1 by mouth two times a day 4)  Metamucil Plus Calcium Caps (Psyllium-calcium) 5)  Cvs Stool Softener 100 Mg Caps (Docusate sodium) 6)  Clobex 0.05 % Lotn (Clobetasol propionate) 7)  Cyanocobalamin 1000 Mcg/ml Soln (Cyanocobalamin) 8)  Prilosec Otc 20 Mg Tbec (Omeprazole magnesium) .Marland Kitchen.. 1 by mouth once daily 9)  Vitamin D 1000 Unit Tabs (Cholecalciferol) 10)  Propranolol Hcl Cr 60 Mg Xr24h-cap (Propranolol hcl) .Marland Kitchen.. 1 by mouth once daily  for high blood pressure and tremor  Other Orders: Vit B12 1000 mcg (J3420) Admin of Therapeutic Inj  intramuscular or subcutaneous (29528)  Patient Instructions: 1)  continue  same medication ..your BP is better and can try  taking at night if am BP readings are elevated.  2)  ROV in 6 months  or as needed 3)  contnue monthly B12 shots  4)  CBCdiff and b12 level before next visit.   Medication Administration  Injection # 1:    Medication: Vit B12 1000 mcg    Diagnosis: ANEMIA, B12 DEFICIENCY (ICD-281.1)    Route: IM    Site: L deltoid    Exp Date: 02/13    Lot #: [id number]    Mfr: American Regent    Patient tolerated injection without complications    Given by: Raechel Ache, RN (June 19, 2010 2:47 PM)  Orders Added: 1)  Vit B12 1000 mcg [J3420] 2)  Admin of Therapeutic Inj  intramuscular or subcutaneous [96372] 3)  Est. Patient Level III [41324]

## 2011-01-16 ENCOUNTER — Encounter: Payer: Self-pay | Admitting: Internal Medicine

## 2011-01-16 ENCOUNTER — Other Ambulatory Visit: Payer: Self-pay | Admitting: Dermatology

## 2011-01-17 ENCOUNTER — Ambulatory Visit: Admit: 2011-01-17 | Payer: Self-pay | Admitting: Internal Medicine

## 2011-01-17 ENCOUNTER — Ambulatory Visit (INDEPENDENT_AMBULATORY_CARE_PROVIDER_SITE_OTHER): Payer: Medicare Other | Admitting: Internal Medicine

## 2011-01-17 DIAGNOSIS — E538 Deficiency of other specified B group vitamins: Secondary | ICD-10-CM

## 2011-01-17 MED ORDER — CYANOCOBALAMIN 1000 MCG/ML IJ SOLN
1000.0000 ug | INTRAMUSCULAR | Status: DC
  Administered 2011-01-17 – 2012-06-08 (×2): 1000 ug via INTRAMUSCULAR

## 2011-01-17 NOTE — Assessment & Plan Note (Signed)
Summary: 6  month follow up/cjr/pt rsc from bmp/cjr   Vital Signs:  Patient profile:   77 year old female Menstrual status:  hysterectomy Weight:      134 pounds Pulse rate:   60 / minute BP sitting:   100 / 60  (right arm) Cuff size:   regular  Vitals Entered By: Romualdo Bolk, CMA (AAMA) (December 20, 2010 10:23 AM)  Serial Vital Signs/Assessments:  Time      Position  BP       Pulse  Resp  Temp     By           R Arm     140/70                         [name]           L Arm     144/74                         [name]                              68                    [name]  CC: Follow-up visit on labs, Hypertension Management   History of Present Illness: Kimberly Silva  comes in today  for follow up of a number of medical problems .  Since last seen no major changes to health .  BP: readings  :  140 + range  no se of meds so far   Tremor:   about the same.   on indeAL .  Neuro sugg double dose of inderal but asking her today has vivid dreams with med  No t alarming and tolerable. B12   doing well o n monntly shots  and feels much better . No falling  . Heel pain off and on right after walking and in am  stiff  ? if plantar fasciitis.  no injury otherwise.  GI NO change .   Hypertension History:      She denies headache, chest pain, palpitations, dyspnea with exertion, orthopnea, PND, peripheral edema, visual symptoms, neurologic problems, syncope, and side effects from treatment.  She notes no problems with any antihypertensive medication side effects.        Positive major cardiovascular risk factors include female age 48 years old or older, hyperlipidemia, and hypertension.  Negative major cardiovascular risk factors include non-tobacco-user status.     Preventive Screening-Counseling & Management  Alcohol-Tobacco     Alcohol drinks/day: 0     Smoking Status: never  Caffeine-Diet-Exercise     Caffeine use/day: 1 coke a day  Does Patient Exercise: no  Current Medications (verified): 1)  Aspir-81 81 Mg Tbec (Aspirin) .... Take 1 Tablet By Mouth Once A Day 2)  Triamterene-Hctz 37.5-25 Mg  Tabs (Triamterene-Hctz) .Marland Kitchen.. 1 By Mouth Once Daily 3)  Metamucil Plus Calcium   Caps (Psyllium-Calcium) 4)  Cvs Stool Softener 100 Mg  Caps (Docusate Sodium) 5)  Taclonex 0.005-0.064 % Oint (Calcipotriene-Betameth Diprop) 6)  Cyanocobalamin 1000 Mcg/ml Soln (Cyanocobalamin) 7)  Prilosec Otc 20 Mg Tbec (Omeprazole Magnesium) .Marland Kitchen.. 1 By Mouth Once Daily 8)  Vitamin D 1000 Unit  Tabs (Cholecalciferol) 9)  Propranolol Hcl Cr 80 Mg Xr24h-Cap (Propranolol  Hcl) .... 1 By Mouth Once Daily 10)  Systane 0.4-0.3 % Soln (Polyethyl Glycol-Propyl Glycol)  Allergies (verified): No Known Drug Allergies  Past History:  Past medical, surgical, family and social histories (including risk factors) reviewed, and no changes noted (except as noted below).  Past Medical History: Reviewed history from 05/22/2010 and no changes required. Hypertension Osteoarthritis Osteopenia  dexa ? Apr 29, 2006 hx of fosamax use  history of fractured left ankle [id number]   Pan endoscopy 04-29-2008  gatric polyp  Tremors  Past Surgical History: Reviewed history from 05/14/2007 and no changes required. Hysterectomy  Family History: Reviewed history from Apr 29, 2008 and no changes required. Parkinson's disease in a brother peripheral vascular disease in a sibling Family History of Colon CA 1st degree relative sister  Family History Breast cancer 1st degree relative sisters x3 Family History Hypertension CAD, CVA mom  Social History: Reviewed history from 02/21/2009 and no changes required. Never Smoked  Husband 04/29/08 died in Hospice care. 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         see hpi  Physical Exam  General:  Well-developed,well-nourished,in no acute distress; alert,appropriate and cooperative throughout examination  some lipd tremor Head:  normocephalic and atraumatic.   Eyes:  vision grossly intact.   Neck:  No deformities, masses, or tenderness noted. Lungs:  Normal respiratory effort, chest expands symmetrically. Lungs are clear to auscultation, no crackles or wheezes. Heart:  Normal rate and regular rhythm. S1 and S2 normal without gallop, murmur, click, rub or other extra sounds. Msk:  right foot with tenderness squuze heel good rom and no point bony tenderness otherwise   psle and temp is normal  some thickened toenails ( like hands) Pulses:  nl cap refill  Neurologic:  alert & oriented X3, strength normal in all extremities, and gait normal.  termor no change  head mouth  slight in hand Skin:  turgor normal, color normal, no suspicious lesions, and no ecchymoses.   Cervical Nodes:  No lymphadenopathy noted Psych:  Oriented X3, memory intact for recent and remote, normally interactive, good eye contact, not anxious appearing, and not depressed appearing.     Impression & Recommendations:  Problem # 1:  HYPERTENSION (ICD-401.9) Assessment Unchanged slightly up and down  ok to increase b blocker and see if heps termor The following medications were removed from the medication list:    Timolol Maleate 10 Mg Tabs (Timolol maleate) .Marland Kitchen... 1 by mouth two times a day Her updated medication list for this problem includes:    Triamterene-hctz 37.5-25 Mg Tabs (Triamterene-hctz) .Marland Kitchen... 1 by mouth once daily    Propranolol Hcl Cr 80 Mg Xr24h-cap (Propranolol hcl) .Marland Kitchen... 1 by mouth once daily    Propranolol Hcl Cr 120 Mg Xr24h-cap (Propranolol hcl) .Marland Kitchen... 1 by mouth once daily  Problem # 2:  TREMOR (ICD-781.0)  no sig  change but not worse see above  Problem # 3:  ANEMIA, B12 DEFICIENCY  (ICD-281.1) Assessment: Improved continue monthly for now    not anemia  and b12 level not done this visit  . but good last year   and feels better will recheck at her check up.  Her updated medication list for this problem includes:    Cyanocobalamin 1000 Mcg/ml Soln (Cyanocobalamin)  Orders: Vit  B12 1000 mcg (J3420) Admin of Therapeutic Inj  intramuscular or subcutaneous (13086)  Problem # 4:  HEEL PAIN, RIGHT (ICD-729.5) Assessment: New mild  ...sounds like plantar faciitis   and dis foot care   consider x ray etc if persistent or  progressive     Complete Medication List: 1)  Aspir-81 81 Mg Tbec (Aspirin) .... Take 1 tablet by mouth once a day 2)  Triamterene-hctz 37.5-25 Mg Tabs (Triamterene-hctz) .Marland Kitchen.. 1 by mouth once daily 3)  Metamucil Plus Calcium Caps (Psyllium-calcium) 4)  Cvs Stool Softener 100 Mg Caps (Docusate sodium) 5)  Taclonex 0.005-0.064 % Oint (Calcipotriene-betameth diprop) 6)  Cyanocobalamin 1000 Mcg/ml Soln (Cyanocobalamin) 7)  Prilosec Otc 20 Mg Tbec (Omeprazole magnesium) .Marland Kitchen.. 1 by mouth once daily 8)  Vitamin D 1000 Unit Tabs (Cholecalciferol) 9)  Propranolol Hcl Cr 80 Mg Xr24h-cap (Propranolol hcl) .Marland Kitchen.. 1 by mouth once daily 10)  Systane 0.4-0.3 % Soln (Polyethyl glycol-propyl glycol) 11)  Propranolol Hcl Cr 120 Mg Xr24h-cap (Propranolol hcl) .Marland Kitchen.. 1 by mouth once daily  Hypertension Assessment/Plan:      The patient's hypertensive risk group is category B: At least one risk factor (excluding diabetes) with no target organ damage.  Her calculated 10 year risk of coronary heart disease is 6 %.  Today's blood pressure is 100/60.  Her blood pressure goal is < 140/90.  Patient Instructions: 1)  INCREASE to 120 propranolol when run out of the 80 mg. 2)  Continue to monitor your blood pressure. 3)  Heel cups  gell  inserts  stretching   at night and cold treatment and avoid barefoot may help the heel. 4)  Will do more evaluation if persistent or  progressive   5)   Medicare wellness visit IN April  2012   6)  will do labs at that time.  Prescriptions: PROPRANOLOL HCL CR 120 MG XR24H-CAP (PROPRANOLOL HCL) 1 by mouth once daily  #30 x 3   Entered and Authorized by:   [name]   Signed by:   [name] on 12/20/2010   Method used:   Reprint   RxID:   [id number] PROPRANOLOL HCL CR 120 MG XR24H-CAP (PROPRANOLOL HCL) 1 by mouth once daily  #30 x 3   Entered and Authorized by:   [name]   Signed by:   [name] on 12/20/2010   Method used:   Print then Give to Patient   RxID:   [id number]    Medication Administration  Injection # 1:    Medication: Vit B12 1000 mcg    Diagnosis: ANEMIA, B12 DEFICIENCY (ICD-281.1)    Route: IM    Site: R deltoid    Exp Date: 07/16/2012    Lot #: [id number]    Mfr: [employer]    Patient tolerated injection without complications    Given by: Romualdo Bolk, CMA (AAMA) (December 20, 2010 11:34 AM)  Orders Added: 1)  Vit B12 1000 mcg [J3420] 2)  Admin of Therapeutic Inj  intramuscular or subcutaneous [96372] 3)  Est. Patient Level IV [63875]

## 2011-02-06 DIAGNOSIS — Z0279 Encounter for issue of other medical certificate: Secondary | ICD-10-CM

## 2011-02-15 ENCOUNTER — Ambulatory Visit (INDEPENDENT_AMBULATORY_CARE_PROVIDER_SITE_OTHER): Payer: Medicare Other | Admitting: Internal Medicine

## 2011-02-15 DIAGNOSIS — D518 Other vitamin B12 deficiency anemias: Secondary | ICD-10-CM

## 2011-02-15 MED ORDER — CYANOCOBALAMIN 1000 MCG/ML IJ SOLN
1000.0000 ug | Freq: Once | INTRAMUSCULAR | Status: AC
  Administered 2011-02-15: 1000 ug via INTRAMUSCULAR

## 2011-03-15 ENCOUNTER — Ambulatory Visit (INDEPENDENT_AMBULATORY_CARE_PROVIDER_SITE_OTHER): Payer: Medicare Other | Admitting: Internal Medicine

## 2011-03-15 DIAGNOSIS — D518 Other vitamin B12 deficiency anemias: Secondary | ICD-10-CM

## 2011-03-15 MED ORDER — CYANOCOBALAMIN 1000 MCG/ML IJ SOLN
1000.0000 ug | Freq: Once | INTRAMUSCULAR | Status: AC
  Administered 2011-03-15: 1000 ug via INTRAMUSCULAR

## 2011-03-26 ENCOUNTER — Encounter: Payer: Self-pay | Admitting: Internal Medicine

## 2011-03-26 ENCOUNTER — Ambulatory Visit (INDEPENDENT_AMBULATORY_CARE_PROVIDER_SITE_OTHER): Payer: Medicare Other | Admitting: Internal Medicine

## 2011-03-26 VITALS — BP 140/70 | HR 57 | Temp 98.1°F | Wt 129.0 lb

## 2011-03-26 DIAGNOSIS — J069 Acute upper respiratory infection, unspecified: Secondary | ICD-10-CM

## 2011-03-26 DIAGNOSIS — I10 Essential (primary) hypertension: Secondary | ICD-10-CM

## 2011-03-26 NOTE — Progress Notes (Signed)
  Subjective:    Patient ID: Kimberly Silva, female    DOB: 05/14/1926, 77 y.o.   MRN: [id number]  HPI Patient comes in today for an acute visit because she has had 3 days of respiratory symptoms. Onset of a scratchy sore throat 3 days ago and then hoarseness and then developed a cough and she has a clear runny nose. No fever although may have had a low-grade one yesterday no chest pain shortness of breath but does have a cough. She tends to get allergies this time a year but hasn't had them recently.  Phlegm it tends to be clear no specific pain.  Exposed to granddaughter who had supposedly allergies but had some cough and runny nose. She has just moved to an assisted living community and is concerned that she could be contagious to other people in the dining room. Blood pressure 141/50 range at home.  No past history of chronic lung disease.  Review of Systems Negative chest pain shortness of breath wheezing change in bowel habits does have some nausea and was eating some crackers today no falling or major vision changes.     Objective:   Physical Exam Well-developed well-nourished in no acute distress who has a resting tremor cognitively intact gait steady. She is mildly hoarse. HEENT: Normocephalic ;atraumatic , Eyes;  PERRL, EOMs  Full, lids and conjunctiva clear,,Ears: no deformities, canals nl, TM landmarks normal,wax in left ear  Nose: no deformity or clear discharge   Mouth : OP clear without lesion or edema . Neck no massses or adenopathy Chest:  Clear to A&P without wheezes rales or rhonchi CV:  S1-S2 no gallops or murmurs peripheral perfusion is normal EXT  normal perfusion no clubbing edema cyanosis          Assessment & Plan:  Acute respiratory infection   this seems to be viral without complication today .  she does have a risk because of her age. Currently no signs of bacterial infection discussed this with her and she should call with alarm findings. Encouraged good  nutrition.  Hypertension  stable

## 2011-04-11 ENCOUNTER — Other Ambulatory Visit: Payer: Self-pay | Admitting: Internal Medicine

## 2011-04-11 ENCOUNTER — Ambulatory Visit (INDEPENDENT_AMBULATORY_CARE_PROVIDER_SITE_OTHER): Payer: Medicare Other | Admitting: Internal Medicine

## 2011-04-11 DIAGNOSIS — D518 Other vitamin B12 deficiency anemias: Secondary | ICD-10-CM

## 2011-04-11 MED ORDER — CYANOCOBALAMIN 1000 MCG/ML IJ SOLN
1000.0000 ug | Freq: Once | INTRAMUSCULAR | Status: AC
  Administered 2011-04-11: 1000 ug via INTRAMUSCULAR

## 2011-04-16 ENCOUNTER — Ambulatory Visit (INDEPENDENT_AMBULATORY_CARE_PROVIDER_SITE_OTHER): Payer: Medicare Other | Admitting: Internal Medicine

## 2011-04-16 ENCOUNTER — Encounter: Payer: Self-pay | Admitting: Internal Medicine

## 2011-04-16 VITALS — BP 100/60 | HR 66 | Ht 60.5 in | Wt 132.0 lb

## 2011-04-16 DIAGNOSIS — H9190 Unspecified hearing loss, unspecified ear: Secondary | ICD-10-CM

## 2011-04-16 DIAGNOSIS — Z0000 Encounter for general adult medical examination without abnormal findings: Secondary | ICD-10-CM

## 2011-04-16 DIAGNOSIS — R259 Unspecified abnormal involuntary movements: Secondary | ICD-10-CM

## 2011-04-16 DIAGNOSIS — E785 Hyperlipidemia, unspecified: Secondary | ICD-10-CM

## 2011-04-16 DIAGNOSIS — M1990 Unspecified osteoarthritis, unspecified site: Secondary | ICD-10-CM

## 2011-04-16 DIAGNOSIS — D518 Other vitamin B12 deficiency anemias: Secondary | ICD-10-CM

## 2011-04-16 DIAGNOSIS — Z23 Encounter for immunization: Secondary | ICD-10-CM

## 2011-04-16 DIAGNOSIS — M899 Disorder of bone, unspecified: Secondary | ICD-10-CM

## 2011-04-16 DIAGNOSIS — Z809 Family history of malignant neoplasm, unspecified: Secondary | ICD-10-CM

## 2011-04-16 DIAGNOSIS — I10 Essential (primary) hypertension: Secondary | ICD-10-CM

## 2011-04-16 LAB — POCT URINALYSIS DIPSTICK
Bilirubin, UA: NEGATIVE
Blood, UA: NEGATIVE
Ketones, UA: NEGATIVE
Nitrite, UA: NEGATIVE
pH, UA: 6.5

## 2011-04-16 LAB — CBC WITH DIFFERENTIAL/PLATELET
Basophils Absolute: 0 10*3/uL (ref 0.0–0.1)
Basophils Relative: 0.6 % (ref 0.0–3.0)
Eosinophils Absolute: 0.1 10*3/uL (ref 0.0–0.7)
HCT: 38.9 % (ref 36.0–46.0)
Hemoglobin: 13.1 g/dL (ref 12.0–15.0)
Lymphs Abs: 1.4 10*3/uL (ref 0.7–4.0)
MCHC: 33.7 g/dL (ref 30.0–36.0)
Monocytes Relative: 9.7 % (ref 3.0–12.0)
Neutro Abs: 4.1 10*3/uL (ref 1.4–7.7)
RDW: 13.4 % (ref 11.5–14.6)

## 2011-04-16 LAB — HEPATIC FUNCTION PANEL
AST: 19 U/L (ref 0–37)
Alkaline Phosphatase: 49 U/L (ref 39–117)
Bilirubin, Direct: 0.1 mg/dL (ref 0.0–0.3)
Total Bilirubin: 0.7 mg/dL (ref 0.3–1.2)

## 2011-04-16 LAB — BASIC METABOLIC PANEL
BUN: 18 mg/dL (ref 6–23)
CO2: 30 mEq/L (ref 19–32)
Chloride: 101 mEq/L (ref 96–112)
Creatinine, Ser: 0.9 mg/dL (ref 0.4–1.2)
Glucose, Bld: 83 mg/dL (ref 70–99)

## 2011-04-16 LAB — LIPID PANEL: Total CHOL/HDL Ratio: 4

## 2011-04-16 NOTE — Patient Instructions (Signed)
We'll plan to be aware of laboratory studies when available. Consider getting her hearing checked by audiologist Check your blood pressure readings occasionally and if they're in the 150 and above range call and we may have to come in to discuss changing her blood pressure medication treatment. Otherwise recheck in 6 months.Kimberly Silva

## 2011-04-16 NOTE — Progress Notes (Signed)
Subjective:    Kimberly Silva is a 77 y.o. female who presents for a First annual wellness visit and med management.   Since her last visit she is better from her respiratory infection. Her blood pressure hasn't been checked recently but is usually in the 140-150 range. She is feeling much better as far as the nausea and nutrition now that her meals are prepared for her. She is adapting well to her new environment. TREMOR: She is on an increased dose of propranolol but her tremors are about the same and her gait may be slightly worse. Her neurologist is considering putting her on other medication. She has no history of falling. No major changes in her vision her hearing is never that good. Cardiac risk factors: advanced age (older than 94 for men, 59 for women) and hypertension.  Activities of Daily Living  In your present state of health, do you have any difficulty performing the following activities?:  Preparing food and eating?: No Bathing yourself: No Getting dressed: No Using the toilet:No Moving around from place to place: No In the past year have you fallen or had a near fall?:No  Current exercise habits: Home exercise routine includes walking 2 miles a day for meals hrs per day.   Dietary issues discussed:  Eating well  3 x per day  Less nausea.   Depression Screen (Note: if answer to either of the following is "Yes", then a more complete depression screening is indicated)  Q1: Over the past two weeks, have you felt down, depressed or hopeless?no Q2: Over the past two weeks, have you felt little interest or pleasure in doing things? no   Hearing:  Not good .   Vision:    Ok on glasses  Yearly check  Safety: No falls .  Has smoke detector and wears seat belts.  No firearms. No excess sun exposure. Sees dentist regularly .   Advance directive :  Reviewed  Memory: Ok     Doing pretty well.   The following portions of the patient's history were reviewed and updated as  appropriate: allergies, current medications, past family history, past medical history, past social history, past surgical history and problem list. Review of Systems Pertinent items are noted in HPI.   Plus ocass hemorrhoidal bleeding   Otherwise  No major change in bowel function   Objective:     Vision done by Dr. Lavona Mound on 07/16/10- normal except for a freckle on eye. Blood pressure 100/60, pulse 66, height 5' 0.5" (1.537 m), weight 132 lb (59.875 kg). Body mass index is 25.36 kg/(m^2). Physical Exam: Vital signs reviewed ZOX:WRUE is a well-developed well-nourished alert cooperative  white female who appears her stated age in no acute distress. She has an intermittent left tremor and right-hand pill-rolling at times at rest.  Good eye contact and facial expression. HEENT: normocephalic  traumatic , Eyes: PERRL EOM's full, conjunctiva clear, Nares: paten,t no deformity discharge or tenderness., Ears: no deformity EAC's clear TMs with normal landmarks. Mouth: clear OP, no lesions, edema.  Moist mucous membranes. Dentition in adequate repair. NECK: supple without masses, thyromegaly or bruits. CHEST/PULM:  Clear to auscultation and percussion breath sounds equal no wheeze , rales or rhonchi. No chest wall deformities or tenderness. Breast: normal by inspection . No dimpling, discharge, masses, tenderness or discharge . LN: no cervical axillary inguinal adenopathy  CV: PMI is nondisplaced, S1 S2 no gallops, murmurs, rubs. Peripheral pulses are full without delay.No JVD .  REPEAT  BP  Right 144/70 Left: 150/70  No clubbing cyanosis or edema  ABDOMEN: Bowel sounds normal nontender  No guard or rebound, no hepato splenomegal no CVA tenderness.  No hernia. Extremtities:  No clubbing cyanosis or edema, no acute joint swelling or redness no focal atrophy thickened nails  NEURO:  Oriented x3, cranial nerves 3-12 appear to be intact, no obvious focal weakness,gait  Some decrease in arm swing no abnormal  reflexes or asymmetrical.  Tremor righ arm at rest and lip tremor intermittently  No significant  cog wheeling.  SKIN: No acute rashes normal turgor, color, no bruising or petechiae. Nail changes PSYCH: Oriented, good eye contact, no obvious depression anxiety, cognition and judgment appear normal.Speech snormal.      Assessment:  .Wellness  Visit  Due for tdap will check on cost of zostavax   Get hearing checked. HT up today   May need change  On diuretic and b blocker ( for tremor)  B12 deficiency  Tremor:  rx by neuro  May be early parkinsons .  OA no change Osteopenia  On vit d      Plan:     During the course of the visit the patient was educated and counseled about appropriate screening and preventive services including:   Td vaccine  zostavax and other  Utd.    Chec BP readings ocass to make sure it is conrolled.  Patient Instructions (the written plan) was given to the patient.    Medicare Attestation I have personally reviewed: The patient's medical and social history Their use of alcohol, tobacco or illicit drugs Their current medications and supplements The patient's functional ability including ADLs,fall risks, home safety risks, cognitive, and hearing and visual impairment Diet and physical activities Evidence for depression or mood disorders  The patient's weight, height, BMI, and vision hx have been recorded in the chart.  I have made referrals, counseling, and provided education to the patient based on review of the above and I have provided the patient with a written personalized care plan for preventive services.

## 2011-04-17 ENCOUNTER — Encounter: Payer: Self-pay | Admitting: Internal Medicine

## 2011-04-17 DIAGNOSIS — H9190 Unspecified hearing loss, unspecified ear: Secondary | ICD-10-CM | POA: Insufficient documentation

## 2011-04-17 DIAGNOSIS — Z809 Family history of malignant neoplasm, unspecified: Secondary | ICD-10-CM | POA: Insufficient documentation

## 2011-04-17 HISTORY — DX: Unspecified hearing loss, unspecified ear: H91.90

## 2011-04-17 LAB — LDL CHOLESTEROL, DIRECT: Direct LDL: 101.1 mg/dL

## 2011-04-17 NOTE — Assessment & Plan Note (Signed)
On long-acting propranolol with minimal help.  Further meds per neurology. No history of falling

## 2011-04-17 NOTE — Assessment & Plan Note (Signed)
As increasing energy since been on replacement therapy. We'll continue for now.

## 2011-04-17 NOTE — Assessment & Plan Note (Signed)
We'll reassess his continually up and may alter medications. Continue for now

## 2011-04-24 ENCOUNTER — Encounter: Payer: Self-pay | Admitting: *Deleted

## 2011-04-25 ENCOUNTER — Telehealth: Payer: Self-pay | Admitting: Internal Medicine

## 2011-04-25 NOTE — Telephone Encounter (Signed)
Pt would like lab results.  

## 2011-04-25 NOTE — Telephone Encounter (Signed)
Advise patient that labs are good. Vit b 12 is still quite high . If she has been getting this every 4 weeks we can go to every 6 weeks for 3-6 months and then rov and see how she is doing. Mailed letter to pt. Pt aware of results.

## 2011-04-30 NOTE — Assessment & Plan Note (Signed)
[facility]                         GASTROENTEROLOGY OFFICE NOTE   NAME:MCLEANZyanna, Leisinger                     MRN:          [id number]  DATE:03/08/2008                            DOB:          1926/04/23    Ms. Schlottman is a 77 year old white female of Dr. Fabian Sharp who is here today  because 2 out of 3 Hemoccult home tests were positive for blood.  She  states that she did them according to the directions and that she had  Hemoccults in the past which were negative.  We have seen Ms. Lamarque in  the past for colonoscopies for a followup of colon polyps.  She had  hyperplastic and villous adenoma polyp in 1992 and in 1997.  Last two  colonoscopies in 2000 and in January 2004 did not show any polyps.  Patient did not see any visible blood per rectum.  She has been having  symptoms of gastroesophageal reflux and approximately 3 weeks ago she  had a nocturnal episode after taking a Metamucil, she woke up with  severe chest pain and choking.  The episode occurred on Thursday.  She  took her Fosamax on Wednesday; 70 mg once a week.  She on several  occasions had hesitation of the food or dysphagia while eating at a  restaurant.  It is not on regular basis, however.  Ms. Fraiser has been  on Prilosec 20 mg a day for presumed acid reflux.   MEDICATIONS:  1. Triamterene/hydrochlorothiazide  37.5/12.5 one p.o. q. day.  2. Potassium chloride 10 mEq q. day.  3. Aspirin 81 mg p.o. q. day.  4. Vitamin D and calcium.  5. Prilosec 20 mg p.o. q. day.  6. Alendronate, which is Fosamax 70 mg p.o. weekly.  7. Metamucil capsule q. day.   Past history significant for high blood pressure, arthritic complaints,  skin cancer in 2007, hysterectomy 1972.   FAMILY HISTORY:  Positive for prostate cancer.  Breast cancer in sisters  and heart disease in Mother.   SOCIAL HISTORY:  Widowed with 5 children, lives alone.  She used to be a  Runner, broadcasting/film/video.  Patient does not smoke, does not  drink alcohol.   REVIEW OF SYSTEMS:  Slight swelling of her feet, back pain and sleeping  problems.   PHYSICAL EXAMINATION:  Blood pressure 144/80.  Pulse 64 and weight 132  pounds.  She was alert, oriented in no distress.  HEENT:  Sclerae are anicteric.  NECK:  Supple without adenopathy.  LUNGS:  Clear to auscultation.  CORE:  Normal S1, normal S2.  ABDOMEN:  Soft, nontender with normoactive bowel sounds.  RECTAL:  Normal rectal tones.  Stool is Hemoccult negative today.   Lab test from Dr. Rosezella [place] office showed normal hemoglobin of 13.9,  hematocrit 40.0 with MCV of 80.18.   IMPRESSION:  44. A 77 year old white female with 2 out of 3 home stool tests for      occult blood positive without specific gastrointestinal symptoms      other than odynophagia and chest pain, which may be related to      presbyesophagus or possibly  due to Fosamax-related esophagitis.  In      fact, I am concerned about her Fosamax-related side effects, since      she may have esophageal dysmotility and may have gastrointestinal      blood loss associated with Fosamax effect.  2. Personal history of villous adenoma of the colon in 1997.  Patient      has had 2 normal colonoscopies since then.  3. Heme-positive stool may be related to use of aspirin 81 mg a day      causing gastropathy.   PLAN:  1. Patient has some hesitations about another colonoscopy because of      her age. Since her blood count is normal and the stools have been      Hemoccult negative,  I would look for bleeding site first in her      upper gastrointestinal tract.  If we found a hiatal hernia or      presbyesophagus, we may have to discuss with Dr. Fabian Sharp modifying      her antiosteroporosis medications to avoid gastrointestinal      irritation.  She will continue to take Prilosec 20 mg daily.  We      have scheduled for upper endoscopy.  Depending on the results, she      may need to repeat Hemoccults or even colonoscopy.      Hedwig Morton. Juanda Chance, MD  Electronically Signed    DMB/MedQ  DD: 03/08/2008  DT: 03/08/2008  Job #: [id number]   cc:   Neta Mends. Fabian Sharp, MD

## 2011-05-03 NOTE — Assessment & Plan Note (Signed)
[facility]                              [facility] OFFICE NOTE   NAME:Uselton, TAYLAH DUBIEL                     MRN:          [id number]  DATE:09/05/2006                            DOB:          02-05-1926    CHIEF COMPLAINT:  New patient to establish, needs refill on medication.   HISTORY OF PRESENT ILLNESS:  Mrs. Pyles is a 77 year old non-smoking  married white female, mother of an RNR who works at [employer] site.  Comes today for first time visit, as she is a previous patient of Dr. Karma Ganja, who recently retired.  She comes in because she needs a new primary  physician, and she is running out of her medications.  She brings her  records with her today.  She is generally well but has diagnosis of  hypertension on medications for a number of years without significant side  effects.  She also has GERD which she controls with p.r.n. Prilosec OTC.   PAST MEDICAL HISTORY:  See database.   PAST SURGICAL HISTORY:  1. In 1972, hysterectomy, noncancerous reasons.  2. Hospitalized in 1982 or so for vertigo.  3. Colonoscopy 2003, no recent polyps.   OBSTETRIC HISTORY:  She is gravida 7, para 2.   GYNECOLOGY HISTORY:  Last Pap April 2007, mammogram December 2007.  Tetanus  shot this year.   FAMILY HISTORY:  Positive for colon cancer in sister, breast cancer in three  sisters.  Mom with heart disease and hypertension, history of cerebral  hemorrhage.   SOCIAL HISTORY:  Household of two.  Negative TAD.  Husband has significant  illness, most recently with leukemia and other chronic illness.   REVIEW OF SYSTEMS:  Negative for chest pain and shortness of breath.  See  database.   MEDICATIONS:  1. HCTZ 50 mg.  2. Atenolol 10 mg one and a half daily.  3. Prilosec 20 mg one p.o. daily.  4. Metamucil with Calcium.  5. Aspirin 81 mg daily.   ALLERGIES:  None recorded.   OBJECTIVE:  VITAL SIGNS:  Height 5 feet 1 inch, weight 134 pounds, pulse  66  and regular, blood pressure 120/80.  GENERAL:  WDWN, healthy-appearing, elderly lady in no acute distress.  HEENT:  Normocephalic.  TM's clear.  Eyes:  PERRL.  Sclerae nonicteric.  Throat was clear.  NECK:  Without masses or thyromegaly or bruits.  CHEST:  CTAP __________ .  CARDIAC:  S1, S2.  No gallops or murmurs.  Peripheral pulses preset without  delay.  Negative CCE.  ABDOMEN:  Soft, without organomegaly, guarding or rebound.  There are no  bruits noted.  NEUROLOGICAL:  Gait within normal limits.  EXTREMITIES:  No focal deficits.   IMPRESSION:  1. Hypertension, controlled, refill medications today.  Will review      records in about one month when lab work would be due, and she can      schedule for checkup in April or May of next year for this.  2. Gastroesophageal reflux disease.  We did discuss using medications,      Prilosec as  needed and trial off.  Apparently, does better on      __________ .  3. Mild osteoarthritis in hands.  Will review records and do interim care      as needed.  She should call once flu shots are available.                                   Neta Mends. Fabian Sharp, MD   WKP/MedQ  DD:  09/05/2006  DT:  09/09/2006  Job #:  [id number]

## 2011-05-14 ENCOUNTER — Other Ambulatory Visit: Payer: Self-pay | Admitting: Internal Medicine

## 2011-05-23 ENCOUNTER — Ambulatory Visit (INDEPENDENT_AMBULATORY_CARE_PROVIDER_SITE_OTHER): Payer: Medicare Other | Admitting: Internal Medicine

## 2011-05-23 DIAGNOSIS — D518 Other vitamin B12 deficiency anemias: Secondary | ICD-10-CM

## 2011-05-23 MED ORDER — CYANOCOBALAMIN 1000 MCG/ML IJ SOLN
1000.0000 ug | Freq: Once | INTRAMUSCULAR | Status: AC
  Administered 2011-05-23: 1000 ug via INTRAMUSCULAR

## 2011-07-04 ENCOUNTER — Ambulatory Visit (INDEPENDENT_AMBULATORY_CARE_PROVIDER_SITE_OTHER): Payer: Medicare Other | Admitting: Internal Medicine

## 2011-07-04 DIAGNOSIS — D518 Other vitamin B12 deficiency anemias: Secondary | ICD-10-CM

## 2011-07-04 MED ORDER — CYANOCOBALAMIN 1000 MCG/ML IJ SOLN
1000.0000 ug | Freq: Once | INTRAMUSCULAR | Status: AC
  Administered 2011-07-04: 1000 ug via INTRAMUSCULAR

## 2011-07-12 ENCOUNTER — Other Ambulatory Visit: Payer: Self-pay | Admitting: Internal Medicine

## 2011-08-15 ENCOUNTER — Ambulatory Visit (INDEPENDENT_AMBULATORY_CARE_PROVIDER_SITE_OTHER): Payer: Medicare Other | Admitting: Internal Medicine

## 2011-08-15 DIAGNOSIS — D518 Other vitamin B12 deficiency anemias: Secondary | ICD-10-CM

## 2011-08-15 MED ORDER — CYANOCOBALAMIN 1000 MCG/ML IJ SOLN
1000.0000 ug | Freq: Once | INTRAMUSCULAR | Status: AC
  Administered 2011-08-15: 1000 ug via INTRAMUSCULAR

## 2011-08-27 ENCOUNTER — Ambulatory Visit (INDEPENDENT_AMBULATORY_CARE_PROVIDER_SITE_OTHER): Payer: Medicare Other | Admitting: Internal Medicine

## 2011-08-27 DIAGNOSIS — Z2911 Encounter for prophylactic immunotherapy for respiratory syncytial virus (RSV): Secondary | ICD-10-CM

## 2011-08-27 DIAGNOSIS — Z23 Encounter for immunization: Secondary | ICD-10-CM

## 2011-09-12 ENCOUNTER — Other Ambulatory Visit: Payer: Self-pay | Admitting: Internal Medicine

## 2011-09-16 ENCOUNTER — Other Ambulatory Visit: Payer: Self-pay | Admitting: Internal Medicine

## 2011-09-20 ENCOUNTER — Other Ambulatory Visit: Payer: Self-pay | Admitting: Internal Medicine

## 2011-09-23 NOTE — Telephone Encounter (Signed)
Pt is out of med

## 2011-09-23 NOTE — Telephone Encounter (Signed)
Pt called to check on status or getting refill of Triamterene-HTCZ. Pt has check with [facility] and nothing as been rcvd.

## 2011-09-24 NOTE — Telephone Encounter (Signed)
Spoke to pharmacy and rx was never received electronically. Rx called in and pt aware of this.

## 2011-09-26 ENCOUNTER — Ambulatory Visit (INDEPENDENT_AMBULATORY_CARE_PROVIDER_SITE_OTHER): Payer: Medicare Other | Admitting: Internal Medicine

## 2011-09-26 DIAGNOSIS — Z23 Encounter for immunization: Secondary | ICD-10-CM

## 2011-09-26 DIAGNOSIS — D518 Other vitamin B12 deficiency anemias: Secondary | ICD-10-CM

## 2011-09-26 MED ORDER — CYANOCOBALAMIN 1000 MCG/ML IJ SOLN
1000.0000 ug | Freq: Once | INTRAMUSCULAR | Status: AC
  Administered 2011-09-26: 1000 ug via INTRAMUSCULAR

## 2011-10-08 ENCOUNTER — Other Ambulatory Visit: Payer: Self-pay | Admitting: Internal Medicine

## 2011-10-23 ENCOUNTER — Ambulatory Visit (INDEPENDENT_AMBULATORY_CARE_PROVIDER_SITE_OTHER): Payer: Medicare Other | Admitting: Internal Medicine

## 2011-10-23 ENCOUNTER — Encounter: Payer: Self-pay | Admitting: Internal Medicine

## 2011-10-23 VITALS — BP 120/80 | HR 60 | Wt 134.0 lb

## 2011-10-23 DIAGNOSIS — K921 Melena: Secondary | ICD-10-CM

## 2011-10-23 DIAGNOSIS — D518 Other vitamin B12 deficiency anemias: Secondary | ICD-10-CM

## 2011-10-23 DIAGNOSIS — M1990 Unspecified osteoarthritis, unspecified site: Secondary | ICD-10-CM

## 2011-10-23 DIAGNOSIS — I10 Essential (primary) hypertension: Secondary | ICD-10-CM

## 2011-10-23 DIAGNOSIS — R259 Unspecified abnormal involuntary movements: Secondary | ICD-10-CM

## 2011-10-23 LAB — CBC WITH DIFFERENTIAL/PLATELET
Basophils Relative: 0.8 % (ref 0.0–3.0)
Eosinophils Absolute: 0.2 10*3/uL (ref 0.0–0.7)
Lymphocytes Relative: 22.9 % (ref 12.0–46.0)
MCHC: 33.5 g/dL (ref 30.0–36.0)
Monocytes Relative: 9.7 % (ref 3.0–12.0)
Neutrophils Relative %: 64.4 % (ref 43.0–77.0)
RBC: 4.53 Mil/uL (ref 3.87–5.11)
WBC: 7.4 10*3/uL (ref 4.5–10.5)

## 2011-10-23 MED ORDER — CYANOCOBALAMIN 1000 MCG/ML IJ SOLN
1000.0000 ug | Freq: Once | INTRAMUSCULAR | Status: AC
  Administered 2011-10-23: 1000 ug via INTRAMUSCULAR

## 2011-10-23 NOTE — Patient Instructions (Addendum)
Blood pressure  is good today If your readings are mostly above 145 or so  Then call for advice . Will notify you  of labs when available. Consider tai chi for balance  Fall prevention.  If labs ok wellness visit. in 6 months and will do labs in the meantime.  B12 depending on labs

## 2011-10-23 NOTE — Progress Notes (Signed)
  Subjective:    Patient ID: Kimberly Silva, female    DOB: [birth year], 77 y.o.   MRN: [id number]  HPI  Patient comes in today for follow up of  multiple medical problems.   Couldn't take  ripronolol  And then carol martin  Decided not needin the med for  PD movements at this time  No falling pretty good balance  Does chair exercises at assisted living  BP:  Not checking a lot.  usally higher systolic150 range. No change B12 no numbness   Taking every 5 weeks  Energy ok    Review of Systems Bleeding:  This past month after bm constipated.  Going next Friday. To see Dr Juanda Chance hsa hemorrhoids. Taking colace bid  And then ocass other midl laxative.   No cp sob cough . Rest as per hpi  Past history family history social history reviewed in the electronic medical record.     Objective:   Physical Exam WDWn in and  mild tremor at rest  Hands  Facial jaw tremor at times  Gait is easy and facile with only slight change arm swing. Oriented x 3 and no noted deficits in memory, attention, and speech. Neck: Supple without adenopathy or masses or bruits Chest:  Clear to A&P without wheezes rales or rhonchi CV:  S1-S2 no gallops or murmurs peripheral perfusion is normal No clubbing cyanosis or edema Repeat BP readings left  134/84  Nl pulse Skin: normal capillary refill ,turgor , color: No acute rashes ,petechiae or bruising MS oa  No acute swelling    Assessment & Plan:  hypertension Seems ok today Tremor felt to be parkinson and essential like nb no meds  Disc tai chi fall prevention doing well. Hx of BRRB  May be perirectal  check cbc today  B12 deficiency   Labs today then shot and plan fu after this.  otherwise wellness visit in 6 months

## 2011-10-24 NOTE — Assessment & Plan Note (Signed)
No change on higher dose b blocker   Had se of  Med so not on anything at this point no falling  Disc balance training etc. Fall prevention

## 2011-10-24 NOTE — Assessment & Plan Note (Signed)
134 138 range today  Stay on med for now consider other  optinos if needed.   b blocker probably not making that much difference  In her tremors.

## 2011-10-24 NOTE — Assessment & Plan Note (Signed)
Check lab today  On every 5 weeks at this point

## 2011-10-24 NOTE — Assessment & Plan Note (Signed)
To see dr Juanda Chance   Check cbc with labs today

## 2011-10-30 ENCOUNTER — Ambulatory Visit: Payer: Medicare Other | Admitting: Internal Medicine

## 2011-11-01 ENCOUNTER — Ambulatory Visit (INDEPENDENT_AMBULATORY_CARE_PROVIDER_SITE_OTHER): Payer: Medicare Other | Admitting: Internal Medicine

## 2011-11-01 ENCOUNTER — Encounter: Payer: Self-pay | Admitting: Internal Medicine

## 2011-11-01 VITALS — BP 160/80 | HR 64 | Ht 62.0 in | Wt 135.0 lb

## 2011-11-01 DIAGNOSIS — K648 Other hemorrhoids: Secondary | ICD-10-CM

## 2011-11-01 DIAGNOSIS — K921 Melena: Secondary | ICD-10-CM

## 2011-11-01 DIAGNOSIS — K625 Hemorrhage of anus and rectum: Secondary | ICD-10-CM

## 2011-11-01 MED ORDER — HYDROCORTISONE ACETATE 25 MG RE SUPP: 25.0000 mg | Freq: Every day | RECTAL | Status: DC

## 2011-11-01 NOTE — Progress Notes (Signed)
Kimberly Silva [birth year] MRN [id number]    History of Present Illness:  This is an 77 year old white female with chronic constipation and 2 episodes of painless low volume rectal bleeding which occurred approximately one month ago. Her last colonoscopy was in 2004 and prior to that in 2000 showing polyp. The histology is not known. Her last hemoglobin was 13.4. She is on B12 supplements.   Past Medical History  Diagnosis Date  . HYPERLIPIDEMIA 02/21/2009  . HYPOKALEMIA 08/17/2007  . ANEMIA, B12 DEFICIENCY 03/13/2009  . HYPERTENSION 05/14/2007  . Blood in stool 01/25/2008  . OSTEOARTHRITIS 05/14/2007  . OSTEOPENIA 05/14/2007    2007 hx of fosamax use   . TREMOR 07/08/2007  . FREQUENCY, URINARY 08/19/2008  . LUMBAR STRAIN 09/21/2007  . Gastric polyp     panendo  5 2009  . History of ankle fracture   . Parkinson's disease   . Adenomatous colon polyp    Past Surgical History  Procedure Date  . Abdominal hysterectomy   . Fracture 2007    left ankle- Hx of fosamax use    reports that she has never smoked. She has never used smokeless tobacco. She reports that she does not drink alcohol or use illicit drugs. family history includes Breast cancer in her sister; Colon cancer (age of onset:80) in her sister; Coronary artery disease in her mother; Heart attack in her mother; Parkinsonism in her brother; Peripheral vascular disease in an unspecified family member; and Stroke in her father. No Known Allergies      Review of Systems: Denies abdominal pain. Negative for gastroesophageal reflux chest pain or shortness of breath  The remainder of the 10 point ROS is negative except as outlined in H&P   Physical Exam: General appearance  Well developed, in no distress. Eyes- non icteric. HEENT nontraumatic, normocephalic. Mouth no lesions, tongue papillated, no cheilosis. Neck supple without adenopathy, thyroid not enlarged, no carotid bruits, no JVD. Lungs Clear to auscultation  bilaterally. Cor normal S1, normal S2, regular rhythm, no murmur,  quiet precordium. Abdomen: Soft nontender abdomen with normal active bowel sounds. No palpable mass. Rectal: Anoscopic exam reveals medium size external hemorrhoids. Normal rectal sphincter tone. Small first grade internal hemorrhoids and hyperemia. No active bleeding. Stool is Hemoccult negative. Extremities no pedal edema. Skin no lesions. Neurological alert and oriented x 3. Psychological normal mood and affect.  Assessment and Plan:  Problem #1 Painless rectal bleeding. This is likely of anorectal origin. I suspect internal or external hemorrhoids as the source of bleeding. We will start her on Anusol-HC suppositories each bedtime. Because of the constipation, we have discussed a high-fiber diet, fiber supplements and she will continue on Colace with senna which has a stimulant. She may take one at bedtime and plain Colace in the morning. She is up-to-date on her colonoscopy.   11/01/2011 Lina Sar

## 2011-11-01 NOTE — Patient Instructions (Signed)
We have sent the following medications to your pharmacy for you to pick up at your convenience: Anusol Suppositories CC: Dr Fabian Sharp

## 2011-11-19 ENCOUNTER — Other Ambulatory Visit: Payer: Self-pay | Admitting: Internal Medicine

## 2011-11-19 DIAGNOSIS — Z1231 Encounter for screening mammogram for malignant neoplasm of breast: Secondary | ICD-10-CM

## 2011-11-27 ENCOUNTER — Ambulatory Visit (INDEPENDENT_AMBULATORY_CARE_PROVIDER_SITE_OTHER): Payer: Medicare Other | Admitting: Internal Medicine

## 2011-11-27 ENCOUNTER — Ambulatory Visit: Payer: Medicare Other | Admitting: Internal Medicine

## 2011-11-27 DIAGNOSIS — D518 Other vitamin B12 deficiency anemias: Secondary | ICD-10-CM

## 2011-11-27 MED ORDER — CYANOCOBALAMIN 1000 MCG/ML IJ SOLN
1000.0000 ug | Freq: Once | INTRAMUSCULAR | Status: AC
  Administered 2011-11-27: 1000 ug via INTRAMUSCULAR

## 2011-12-24 ENCOUNTER — Ambulatory Visit
Admission: RE | Admit: 2011-12-24 | Discharge: 2011-12-24 | Disposition: A | Discharge status: Home / Self Care | Payer: Medicare Other | Source: Ambulatory Visit | Attending: Internal Medicine | Admitting: Internal Medicine

## 2011-12-24 DIAGNOSIS — Z1231 Encounter for screening mammogram for malignant neoplasm of breast: Secondary | ICD-10-CM

## 2011-12-27 ENCOUNTER — Other Ambulatory Visit: Payer: Self-pay | Admitting: Internal Medicine

## 2012-01-01 ENCOUNTER — Ambulatory Visit (INDEPENDENT_AMBULATORY_CARE_PROVIDER_SITE_OTHER): Payer: Medicare Other | Admitting: Family Medicine

## 2012-01-01 DIAGNOSIS — D518 Other vitamin B12 deficiency anemias: Secondary | ICD-10-CM

## 2012-01-01 MED ORDER — CYANOCOBALAMIN 1000 MCG/ML IJ SOLN
1000.0000 ug | Freq: Once | INTRAMUSCULAR | Status: AC
  Administered 2012-01-01: 1000 ug via INTRAMUSCULAR

## 2012-01-13 ENCOUNTER — Telehealth: Payer: Self-pay | Admitting: *Deleted

## 2012-01-13 MED ORDER — PROPRANOLOL HCL ER 120 MG PO CP24: 120.0000 mg | ORAL_CAPSULE | Freq: Every day | ORAL | Status: DC

## 2012-01-13 NOTE — Telephone Encounter (Signed)
refill 

## 2012-01-30 ENCOUNTER — Ambulatory Visit (INDEPENDENT_AMBULATORY_CARE_PROVIDER_SITE_OTHER): Payer: Medicare Other | Admitting: Internal Medicine

## 2012-01-30 DIAGNOSIS — D518 Other vitamin B12 deficiency anemias: Secondary | ICD-10-CM

## 2012-01-30 MED ORDER — CYANOCOBALAMIN 1000 MCG/ML IJ SOLN
1000.0000 ug | Freq: Once | INTRAMUSCULAR | Status: AC
  Administered 2012-01-30: 1000 ug via INTRAMUSCULAR

## 2012-02-10 ENCOUNTER — Telehealth: Payer: Self-pay | Admitting: *Deleted

## 2012-02-10 NOTE — Telephone Encounter (Signed)
Pt states she took her beta blocker but did not take her diuretic.  Pt states she will call to report how she is feeling tomorrow.  Pt advised to stay well hydrated.

## 2012-02-10 NOTE — Telephone Encounter (Signed)
Pt left message on triage stating that she had GI virus.  Nurse wants her to know whether to take her BP and diuretic meds.  She threw them up yesterday.   Symptoms started with vomiting and diarrhea Sunday morning.  One last episode of diarrhea, and last vomiting yesterday.  Only has had jello since being sick.  One cracker.  Drinking ginger ale and water. Low grade fever yesterday. None today.

## 2012-02-10 NOTE — Telephone Encounter (Signed)
Pt is calling back to get an answer from Dr. Fabian Sharp.  She has not taken her meds and her BP is higher than usual.

## 2012-02-11 ENCOUNTER — Telehealth: Payer: Self-pay | Admitting: *Deleted

## 2012-02-11 NOTE — Telephone Encounter (Signed)
Notified pt. 

## 2012-02-11 NOTE — Telephone Encounter (Signed)
Pt is wanting to know when she can start her fluid pill again. She is starting to eat jello and a bake potato. She also wants to know when can she start taking prilosec again as well.

## 2012-02-11 NOTE — Telephone Encounter (Signed)
Pt called back asking again when she can start her fluid pill again.  Computers were down when she called earlier.

## 2012-02-11 NOTE — Telephone Encounter (Signed)
Would wait until tomorrow on the diuretic  Can try prilosec today.

## 2012-02-28 ENCOUNTER — Ambulatory Visit (INDEPENDENT_AMBULATORY_CARE_PROVIDER_SITE_OTHER): Payer: Medicare Other | Admitting: *Deleted

## 2012-02-28 DIAGNOSIS — D518 Other vitamin B12 deficiency anemias: Secondary | ICD-10-CM

## 2012-02-28 MED ORDER — CYANOCOBALAMIN 1000 MCG/ML IJ SOLN
1000.0000 ug | Freq: Once | INTRAMUSCULAR | Status: AC
  Administered 2012-02-28: 1000 ug via INTRAMUSCULAR

## 2012-02-28 NOTE — Patient Instructions (Signed)
Injection verified SN 

## 2012-03-25 ENCOUNTER — Other Ambulatory Visit: Payer: Self-pay | Admitting: Internal Medicine

## 2012-04-02 ENCOUNTER — Other Ambulatory Visit: Payer: Self-pay | Admitting: Internal Medicine

## 2012-04-03 ENCOUNTER — Ambulatory Visit: Payer: Medicare Other | Admitting: *Deleted

## 2012-04-03 ENCOUNTER — Ambulatory Visit (INDEPENDENT_AMBULATORY_CARE_PROVIDER_SITE_OTHER): Payer: Medicare Other

## 2012-04-03 DIAGNOSIS — D518 Other vitamin B12 deficiency anemias: Secondary | ICD-10-CM

## 2012-04-03 MED ORDER — CYANOCOBALAMIN 1000 MCG/ML IJ SOLN
1000.0000 ug | Freq: Once | INTRAMUSCULAR | Status: AC
  Administered 2012-04-03: 1000 ug via INTRAMUSCULAR

## 2012-04-06 ENCOUNTER — Telehealth: Payer: Self-pay | Admitting: *Deleted

## 2012-04-06 DIAGNOSIS — H9190 Unspecified hearing loss, unspecified ear: Secondary | ICD-10-CM

## 2012-04-06 NOTE — Telephone Encounter (Signed)
Please do referral  For decrease hearing.

## 2012-04-06 NOTE — Telephone Encounter (Signed)
Referral sent 

## 2012-04-06 NOTE — Telephone Encounter (Signed)
Pt made appt with [facility], and needs a letter of referral sent to them.  Fax to [phone] Her appt. Is next week.

## 2012-04-06 NOTE — Telephone Encounter (Signed)
Pls advise.  

## 2012-04-24 ENCOUNTER — Ambulatory Visit (INDEPENDENT_AMBULATORY_CARE_PROVIDER_SITE_OTHER): Payer: Medicare Other | Admitting: Internal Medicine

## 2012-04-24 VITALS — BP 130/78 | HR 63 | Temp 98.6°F | Ht 61.0 in | Wt 135.0 lb

## 2012-04-24 DIAGNOSIS — I10 Essential (primary) hypertension: Secondary | ICD-10-CM

## 2012-04-24 DIAGNOSIS — E785 Hyperlipidemia, unspecified: Secondary | ICD-10-CM

## 2012-04-24 DIAGNOSIS — H9190 Unspecified hearing loss, unspecified ear: Secondary | ICD-10-CM

## 2012-04-24 DIAGNOSIS — R259 Unspecified abnormal involuntary movements: Secondary | ICD-10-CM

## 2012-04-24 DIAGNOSIS — D518 Other vitamin B12 deficiency anemias: Secondary | ICD-10-CM

## 2012-04-24 DIAGNOSIS — M1990 Unspecified osteoarthritis, unspecified site: Secondary | ICD-10-CM

## 2012-04-24 DIAGNOSIS — Z0000 Encounter for general adult medical examination without abnormal findings: Secondary | ICD-10-CM

## 2012-04-24 LAB — CBC WITH DIFFERENTIAL/PLATELET
Basophils Absolute: 0 10*3/uL (ref 0.0–0.1)
Eosinophils Absolute: 0.1 10*3/uL (ref 0.0–0.7)
Lymphocytes Relative: 16.3 % (ref 12.0–46.0)
MCHC: 32.3 g/dL (ref 30.0–36.0)
Neutrophils Relative %: 72.6 % (ref 43.0–77.0)
RBC: 4.45 Mil/uL (ref 3.87–5.11)
RDW: 13.4 % (ref 11.5–14.6)

## 2012-04-24 LAB — LIPID PANEL
Cholesterol: 199 mg/dL (ref 0–200)
LDL Cholesterol: 105 mg/dL — ABNORMAL HIGH (ref 0–99)
Triglycerides: 122 mg/dL (ref 0.0–149.0)
VLDL: 24.4 mg/dL (ref 0.0–40.0)

## 2012-04-24 LAB — HEPATIC FUNCTION PANEL
Alkaline Phosphatase: 49 U/L (ref 39–117)
Bilirubin, Direct: 0 mg/dL (ref 0.0–0.3)

## 2012-04-24 LAB — BASIC METABOLIC PANEL
CO2: 28 mEq/L (ref 19–32)
Calcium: 9.2 mg/dL (ref 8.4–10.5)
Creatinine, Ser: 1.2 mg/dL (ref 0.4–1.2)
Glucose, Bld: 85 mg/dL (ref 70–99)

## 2012-04-24 NOTE — Patient Instructions (Signed)
Will notify you  of labs when available.  Ask Dr Marjory Lies about  Necessity of staying on the propranolol for tremor or other use. If needed we can consider changing to another BP med but for now will keep it the same because your BP is controlled.  Continue B12 shots. Recheck if shoulder discomfort is  persistent or progressive  Consider seeing a podiatrist.  ROV in 6 months .

## 2012-04-24 NOTE — Progress Notes (Signed)
Subjective:    Patient ID: Kimberly Silva, female    DOB: 23-Mar-1926, 78 y.o.   MRN: [id number]  HPI Patient comes in today for follow up of  multiple medical problems.  Since last visit has more tremor and battling  New meds     Also taking  Bp ok and b12 no bleeding   Sees eye skin neuro and g i coc.   Right arms shoulder huts recently    Hearing:  Fitted for aids   Vision:  No limitations at present .  Safety:  Has smoke detector and wears seat belts.  No firearms. No excess sun exposure. Sees dentist regularly.  Falls:  No but feels unbalanced   Advance directive :  Reviewed    Memory: Felt to be good  , no concern from her or her family.  Depression: No anhedonia unusual crying or depressive symptoms  Nutrition: Eats well balanced diet; adequate calcium and vitamin D. No swallowing chewiing problems.  Injury: no major injuries in the last six months.  Other healthcare providers:  Reviewed today .  Social:  Widowed  At friends home . No pets.   Preventive parameters: up-to-date on colonoscopy, mammogram, immunizations. Including Tdap and pneumovax.  ADLS:   There are no problems or need for assistance  driving, feeding, obtaining food, dressing, toileting and bathing, managing money using phone. She is independent.   Review of Systems ROS:  GEN/ HEENT: No fever, significant weight changes sweats headaches vision problems hearing changes, CV/ PULM; No chest pain shortness of breath cough, syncope,edema  change in exercise tolerance. GI /GU: No adominal pain, vomiting, change in bowel habits. No blood in the stool. No significant GU symptoms. Constipation   SKIN/HEME: ,no acute skin rashes suspicious lesions or bleeding. No lymphadenopathy, nodules, masses.  NEURO/ PSYCH:  No neurologic signs such as weakness numbness. No depression anxiety. IMM/ Allergy: No unusual infections.  Allergy .   REST of 12 system review negative except as per HPI     Objective:   Physical Exam Pulse 63  Temp(Src) 98.6 F (37 C) (Oral)  Ht 5\' 1"  (1.549 m)  Wt 135 lb (61.236 kg)  BMI 25.51 kg/m2  SpO2 98% WDWN in na d Physical Exam: Vital signs reviewed WUJ:WJXB is a well-developed well-nourished alert cooperative  white female who appears her stated age in no acute distress.  With facial and hand tremor more with rest.  HEENT: normocephalic atraumatic , Eyes: PERRL EOM's full, conjunctiva clear, Nares: paten,t no deformity discharge or tenderness., Ears: no deformity EAC's clear TMs with normal landmarks. Mouth: clear OP, no lesions, edema.  Moist mucous membranes. Dentition in adequate repair. NECK: supple without masses, thyromegaly or bruits. CHEST/PULM:  Clear to auscultation and percussion breath sounds equal no wheeze , rales or rhonchi. No chest wall deformities or tenderness. Breast: normal by inspection . No dimpling, discharge, masses, tenderness or discharge .  CV: PMI is nondisplaced, S1 S2 no gallops, murmurs, rubs. Peripheral pulses are full without delay.No JVD .  ABDOMEN: Bowel sounds normal nontender  No guard or rebound, no hepato splenomegal no CVA tenderness.  No hernia. Extremtities:  No clubbing cyanosis or edema, no acute joint swelling or redness no focal atrophy NEURO:  Oriented x3, cranial nerves 3-12 appear to be intact, no obvious focal weakness,gait within normal limits no abnormal reflexes or asymmetrical gait reasonably stable  SKIN: No acute rashes normal turgor, color, no bruising or petechiae. Multiple sks on back . Nails thickened  and yellow feet and left hand  PSYCH: Oriented, good eye contact, no obvious depression anxiety, cognition and judgment appear normal. LN: no cervical axillary inguinal adenopathy     Assessment & Plan:  Preventive Health Care Counseled regarding healthy nutrition, exercise, sleep, injury prevention, calcium vit d and healthy weight .Up to date  on healthcare parameters per hx  Tremor   Essential and  parkinsons  Type  Under med s and changes, with se of ropiniole Hypertension  Seems controlled   Can ask neuro if b blocer  Helping  Tremor  And if poss to change if appropriate. Shoulder discomfort right  Poss joint related  Fu  If  persistent or progressive ok exam today .  OA  Nails  Thickened  hands and toenails  Hearing  Duffy Bruce said may want to get ear doc also but no report today. B12 deficiency   Continue shots  Get level today. Constipation   Not concerned about underlying  Disease state   rx as per Dr Juanda Chance.

## 2012-04-25 ENCOUNTER — Encounter: Payer: Self-pay | Admitting: Internal Medicine

## 2012-05-06 ENCOUNTER — Telehealth: Payer: Self-pay | Admitting: Internal Medicine

## 2012-05-06 NOTE — Progress Notes (Signed)
Quick Note:  Left a message for pt to return call. ______ 

## 2012-05-06 NOTE — Telephone Encounter (Signed)
Pt returning a call from nurse. Pls call back.

## 2012-05-07 ENCOUNTER — Other Ambulatory Visit: Payer: Self-pay | Admitting: Internal Medicine

## 2012-05-07 ENCOUNTER — Telehealth: Payer: Self-pay

## 2012-05-07 DIAGNOSIS — I10 Essential (primary) hypertension: Secondary | ICD-10-CM

## 2012-05-07 DIAGNOSIS — E876 Hypokalemia: Secondary | ICD-10-CM

## 2012-05-07 DIAGNOSIS — D649 Anemia, unspecified: Secondary | ICD-10-CM

## 2012-05-07 NOTE — Telephone Encounter (Signed)
Called and spoke with pt about lab results and pt is aware.  

## 2012-05-07 NOTE — Telephone Encounter (Signed)
Pt states Dr. Marjory Lies prescribed carbidopa and levadopa for pt for her tremors.  Pt states previously Dr. Fabian Sharp had prescribed propanolol for pt's bp and tremors as well.  Pt would like to know if Dr. Fabian Sharp would like her to stay on propanolol as well.  Pls advise.

## 2012-05-07 NOTE — Progress Notes (Signed)
Quick Note:  Spoke with pt and pt is aware of lab results. Pt states she will try potassium reach foods and make an appt for labs in 1 month. ______

## 2012-05-08 ENCOUNTER — Ambulatory Visit (INDEPENDENT_AMBULATORY_CARE_PROVIDER_SITE_OTHER): Payer: Medicare Other

## 2012-05-08 DIAGNOSIS — D518 Other vitamin B12 deficiency anemias: Secondary | ICD-10-CM

## 2012-05-08 MED ORDER — CYANOCOBALAMIN 1000 MCG/ML IJ SOLN
1000.0000 ug | Freq: Once | INTRAMUSCULAR | Status: AC
  Administered 2012-05-08: 1000 ug via INTRAMUSCULAR

## 2012-05-08 NOTE — Telephone Encounter (Signed)
Pt came into office for b12 injection.  Per Dr. Fabian Sharp informed pt that if Dr. Danae Orleans agrees that pt should continue taking the propanolol prescribed.  If not Dr. Fabian Sharp states pt's medicaiton can be changed to metoprolol.  Spoke with pt and pt states that the new medicaiton carbidopa and levadopa were sent to pharmacy and the physician did not state if pt should continue or discontinue propanolol.  Pt states at a prior visit pt was told that it was up to Dr. Fabian Sharp.  Pt request that Dr. Danae Orleans be contacted.  Pls advise.

## 2012-05-09 ENCOUNTER — Other Ambulatory Visit: Payer: Self-pay | Admitting: Internal Medicine

## 2012-06-08 ENCOUNTER — Other Ambulatory Visit (INDEPENDENT_AMBULATORY_CARE_PROVIDER_SITE_OTHER): Payer: Medicare Other

## 2012-06-08 DIAGNOSIS — I10 Essential (primary) hypertension: Secondary | ICD-10-CM

## 2012-06-08 DIAGNOSIS — D518 Other vitamin B12 deficiency anemias: Secondary | ICD-10-CM

## 2012-06-08 DIAGNOSIS — E538 Deficiency of other specified B group vitamins: Secondary | ICD-10-CM

## 2012-06-08 DIAGNOSIS — D649 Anemia, unspecified: Secondary | ICD-10-CM

## 2012-06-08 DIAGNOSIS — E876 Hypokalemia: Secondary | ICD-10-CM

## 2012-06-08 LAB — BASIC METABOLIC PANEL
BUN: 14 mg/dL (ref 6–23)
CO2: 29 mEq/L (ref 19–32)
Chloride: 97 mEq/L (ref 96–112)
Potassium: 4 mEq/L (ref 3.5–5.1)

## 2012-06-08 LAB — MAGNESIUM: Magnesium: 1.7 mg/dL (ref 1.5–2.5)

## 2012-06-15 ENCOUNTER — Other Ambulatory Visit: Payer: Self-pay | Admitting: Family Medicine

## 2012-06-15 MED ORDER — METOPROLOL SUCCINATE ER 50 MG PO TB24: 50.0000 mg | ORAL_TABLET | Freq: Every day | ORAL | Status: DC

## 2012-06-21 ENCOUNTER — Other Ambulatory Visit: Payer: Self-pay | Admitting: Internal Medicine

## 2012-06-22 ENCOUNTER — Other Ambulatory Visit: Payer: Self-pay | Admitting: Internal Medicine

## 2012-06-23 ENCOUNTER — Other Ambulatory Visit: Payer: Self-pay | Admitting: Internal Medicine

## 2012-06-23 MED ORDER — OMEPRAZOLE MAGNESIUM 20 MG PO TBEC: 20.0000 mg | DELAYED_RELEASE_TABLET | Freq: Every day | ORAL | Status: DC

## 2012-06-29 ENCOUNTER — Ambulatory Visit (INDEPENDENT_AMBULATORY_CARE_PROVIDER_SITE_OTHER): Payer: Medicare Other | Admitting: Internal Medicine

## 2012-06-29 ENCOUNTER — Encounter: Payer: Self-pay | Admitting: Internal Medicine

## 2012-06-29 VITALS — BP 150/72 | HR 78 | Temp 98.4°F | Wt 131.0 lb

## 2012-06-29 DIAGNOSIS — D518 Other vitamin B12 deficiency anemias: Secondary | ICD-10-CM

## 2012-06-29 DIAGNOSIS — R259 Unspecified abnormal involuntary movements: Secondary | ICD-10-CM

## 2012-06-29 DIAGNOSIS — G20 Parkinson's disease: Secondary | ICD-10-CM

## 2012-06-29 DIAGNOSIS — I10 Essential (primary) hypertension: Secondary | ICD-10-CM

## 2012-06-29 MED ORDER — PROPRANOLOL HCL ER 120 MG PO CP24: 120.0000 mg | ORAL_CAPSULE | Freq: Every day | ORAL | Status: AC

## 2012-06-29 NOTE — Progress Notes (Signed)
Subjective:    Patient ID: Kimberly Silva, female    DOB: February 07, 1926, 78 y.o.   MRN: [id number]  HPI Patient comes in today for follow up of  multiple medical problems.  Since her last visit she has switched the Inderal over to metoprolol as we have discussed. Her Parkinsonian tremors had been at bay on the levodopa. However almost immediately after she switched her blood pressure medication her tremors got worse. She saw the PA at the neurology office felt she had to reasons for tremor. Her blood pressures a little higher than it was on the Inderal 120 extended release. She still getting her B12 shots no new symptoms. Decided to come in to discuss what to do about the situation.  In regard to her blood pressure the diuretic is helpful for the swelling in her feet. No new numbness falling other neurologic change.  Remembers original bp medication a  Before  Was blockadren ? Atenolol  No falling or injury from last visit.  Review of Systems Outpatient Encounter Prescriptions as of 06/29/2012  Medication Sig Dispense Refill  . aspirin 81 MG tablet Take 81 mg by mouth daily.        . carbidopa-levodopa (SINEMET IR) 25-100 MG per tablet Take 1 tablet by mouth 2 (two) times daily.      . cholecalciferol (VITAMIN D) 1000 UNITS tablet Take 1,000 Units by mouth daily.        . Clobetasol Propionate 0.05 % lotion Apply topically daily.       . cyanocobalamin 1000 MCG/ML injection Inject into the muscle.        . docusate sodium (COLACE) 100 MG capsule Take 100 mg by mouth 2 (two) times daily.        . hydrocortisone (ANUSOL-HC) 25 MG suppository INSERT 1 SUPPOSITORY RECTALLY AT BEDTIME  12 suppository  0  . metoprolol succinate (TOPROL-XL) 50 MG 24 hr tablet Take 1 tablet (50 mg total) by mouth daily. Take with or immediately following a meal.  30 tablet  2  . omeprazole (PRILOSEC OTC) 20 MG tablet Take 1 tablet (20 mg total) by mouth daily.  90 tablet  0  . Polyethyl Glycol-Propyl Glycol (SYSTANE)  0.4-0.3 % SOLN Apply to eye.       Marland Kitchen Psyllium-Calcium (METAMUCIL PLUS CALCIUM PO) Take by mouth.        . triamterene-hydrochlorothiazide (MAXZIDE-25) 37.5-25 MG per tablet TAKE 1 TABLET BY MOUTH EVERY DAY  30 tablet  0  . DISCONTD: Sennosides-Docusate Sodium (STOOL SOFTENER & LAXATIVE PO) Take by mouth as needed.         Facility-Administered Encounter Medications as of 06/29/2012  Medication Dose Route Frequency Provider Last Rate Last Dose  . cyanocobalamin injection 1,000 mcg  1,000 mcg Intramuscular Q30 days Madelin Headings, MD   1,000 mcg at 06/08/12 1553       Objective:   Physical Exam BP 150/72  Pulse 78  Temp 98.4 F (36.9 C) (Oral)  Wt 131 lb (59.421 kg)  SpO2 98% Well-developed well-nourished in no acute distress with hand tremors bilaterally increase for last visit intention and rest Oriented x 3 and no noted deficits in memory, attention, and speech. Blood pressure reviewed as well as med list. Bp 148/70  And 150/70 Cor rr no g or murmur    Lab Results  Component Value Date   WBC 7.9 04/24/2012   HGB 12.7 04/24/2012   HCT 39.3 04/24/2012   PLT 209.0 04/24/2012   GLUCOSE  95 06/08/2012   CHOL 199 04/24/2012   TRIG 122.0 04/24/2012   HDL 69.30 04/24/2012   LDLDIRECT 101.1 04/16/2011   LDLCALC 105* 04/24/2012   ALT 10 04/24/2012   AST 17 04/24/2012   NA 136 06/08/2012   K 4.0 06/08/2012   CL 97 06/08/2012   CREATININE 1.0 06/08/2012   BUN 14 06/08/2012   CO2 29 06/08/2012   TSH 0.90 04/24/2012   Lab Results  Component Value Date   VITAMINB12 574 04/24/2012        Assessment & Plan:   Hypertension up some today   After change to metorpolol  Plan was originally to change over but see below   Tremor  and jaw tremor  Essential and  parkinsonian.  Under dosage adjustment also per neuro . She had significant improvement assumption from inderal for the essential tremor.   Will swithc back to inderal 120 er  And fu bp and meds in about a month .   Then address bp control   Again. B12 deficiiency  adequate level  Continue inject monthly q 4 weeks. Total visit > 50% spent counseling and coordinating care  Review

## 2012-06-29 NOTE — Patient Instructions (Addendum)
Change back over to the inderal  80 mg  ER er day and stop the metoprolol.   Change your follow up appt  To a month from now. Week of august 20 or later   Continue   Vit b12 shots every 4 weeks.

## 2012-07-05 DIAGNOSIS — G20C Parkinsonism, unspecified: Secondary | ICD-10-CM | POA: Insufficient documentation

## 2012-07-05 DIAGNOSIS — G20 Parkinson's disease: Secondary | ICD-10-CM | POA: Insufficient documentation

## 2012-07-07 ENCOUNTER — Ambulatory Visit (INDEPENDENT_AMBULATORY_CARE_PROVIDER_SITE_OTHER): Payer: Medicare Other

## 2012-07-07 DIAGNOSIS — D519 Vitamin B12 deficiency anemia, unspecified: Secondary | ICD-10-CM

## 2012-07-07 DIAGNOSIS — D518 Other vitamin B12 deficiency anemias: Secondary | ICD-10-CM

## 2012-07-07 MED ORDER — CYANOCOBALAMIN 1000 MCG/ML IJ SOLN
1000.0000 ug | Freq: Once | INTRAMUSCULAR | Status: AC
  Administered 2012-07-07: 1000 ug via INTRAMUSCULAR

## 2012-07-17 ENCOUNTER — Ambulatory Visit: Payer: Medicare Other | Admitting: Internal Medicine

## 2012-07-18 ENCOUNTER — Other Ambulatory Visit: Payer: Self-pay | Admitting: Internal Medicine

## 2012-08-04 ENCOUNTER — Encounter: Payer: Self-pay | Admitting: Internal Medicine

## 2012-08-04 ENCOUNTER — Ambulatory Visit (INDEPENDENT_AMBULATORY_CARE_PROVIDER_SITE_OTHER): Payer: Medicare Other | Admitting: Internal Medicine

## 2012-08-04 VITALS — BP 130/64 | HR 65 | Temp 98.2°F | Wt 133.0 lb

## 2012-08-04 DIAGNOSIS — G20 Parkinson's disease: Secondary | ICD-10-CM

## 2012-08-04 DIAGNOSIS — I10 Essential (primary) hypertension: Secondary | ICD-10-CM

## 2012-08-04 DIAGNOSIS — D518 Other vitamin B12 deficiency anemias: Secondary | ICD-10-CM

## 2012-08-04 DIAGNOSIS — K5900 Constipation, unspecified: Secondary | ICD-10-CM

## 2012-08-04 DIAGNOSIS — R259 Unspecified abnormal involuntary movements: Secondary | ICD-10-CM

## 2012-08-04 DIAGNOSIS — D519 Vitamin B12 deficiency anemia, unspecified: Secondary | ICD-10-CM

## 2012-08-04 DIAGNOSIS — R110 Nausea: Secondary | ICD-10-CM

## 2012-08-04 MED ORDER — CYANOCOBALAMIN 1000 MCG/ML IJ SOLN
1000.0000 ug | Freq: Once | INTRAMUSCULAR | Status: AC
  Administered 2012-08-04: 1000 ug via INTRAMUSCULAR

## 2012-08-04 NOTE — Progress Notes (Signed)
Subjective:    Patient ID: Kimberly Silva, female    DOB: August 08, 1926, 78 y.o.   MRN: [id number]  HPI Patient comes in today for follow up of  multiple medical problems.  Since her last visit we have stopped the metoprolol and she is restarting the Inderal LA 120 mg. Since we have switched back she thinks that her tremors have gotten better. She was supposed to increase her Sinemet to treat the day but decided to stay at 2 a day and seems to be doing okay. No falling no new tremor.  Her blood pressure readings are in the 1:30 range although occasionally 150 in the morning. She's unsure of the reliability of her machine.  She has ongoing nausea and constipation taking a stool softener as per Dr. Juanda Chance sometimes increasing to 3 a day and asks if this is safe. No bleeding vomiting or weight loss  She is due for a B12 shot today. Review of Systems Negative for chest pain shortness of breath syncope fever or UTI symptoms vomiting weight loss rest as per history of present illness  Past history family history social history reviewed in the electronic medical record.   Outpatient Encounter Prescriptions as of 08/04/2012  Medication Sig Dispense Refill  . aspirin 81 MG tablet Take 81 mg by mouth daily.        . carbidopa-levodopa (SINEMET IR) 25-100 MG per tablet Take 1 tablet by mouth 2 (two) times daily.      . cholecalciferol (VITAMIN D) 1000 UNITS tablet Take 1,000 Units by mouth daily.        . Clobetasol Propionate 0.05 % lotion Apply topically daily.       . cyanocobalamin 1000 MCG/ML injection Inject into the muscle.        . docusate sodium (COLACE) 100 MG capsule Take 100 mg by mouth 2 (two) times daily.        . hydrocortisone (ANUSOL-HC) 25 MG suppository INSERT 1 SUPPOSITORY RECTALLY AT BEDTIME  12 suppository  0  . omeprazole (PRILOSEC OTC) 20 MG tablet Take 1 tablet (20 mg total) by mouth daily.  90 tablet  0  . Polyethyl Glycol-Propyl Glycol (SYSTANE) 0.4-0.3 % SOLN Apply to  eye.       . propranolol ER (INDERAL LA) 120 MG 24 hr capsule Take 1 capsule (120 mg total) by mouth daily.  30 capsule  0  . Psyllium-Calcium (METAMUCIL PLUS CALCIUM PO) Take by mouth.        . triamterene-hydrochlorothiazide (MAXZIDE-25) 37.5-25 MG per tablet TAKE 1 TABLET BY MOUTH EVERY DAY  30 tablet  0   Facility-Administered Encounter Medications as of 08/04/2012  Medication Dose Route Frequency Provider Last Rate Last Dose  . cyanocobalamin injection 1,000 mcg  1,000 mcg Intramuscular Q30 days Madelin Headings, MD   1,000 mcg at 06/08/12 1553      Objective:   Physical Exam BP 130/64  Pulse 65  Temp 98.2 F (36.8 C) (Oral)  Wt 133 lb (60.328 kg)  SpO2 95%  REPEAT BP reading 134/60 right arm.    wdwn in nad minimal rest tremor today at rest   Looks well gait dec arm swing but steady  Cor rr No clubbing cyanosis or edema Alert oriented with normal speech.  Lab Results  Component Value Date   WBC 7.9 04/24/2012   HGB 12.7 04/24/2012   HCT 39.3 04/24/2012   PLT 209.0 04/24/2012   GLUCOSE 95 06/08/2012   CHOL 199 04/24/2012  TRIG 122.0 04/24/2012   HDL 69.30 04/24/2012   LDLDIRECT 101.1 04/16/2011   LDLCALC 105* 04/24/2012   ALT 10 04/24/2012   AST 17 04/24/2012   NA 136 06/08/2012   K 4.0 06/08/2012   CL 97 06/08/2012   CREATININE 1.0 06/08/2012   BUN 14 06/08/2012   CO2 29 06/08/2012   TSH 0.90 04/24/2012      Assessment & Plan:  parkinsons  On same dose  No falling or prgression at this time. Essential tremor. Much better on inderal  Continue and stay off of the metoprolol  HT seems controlled at this time. Check reading ocass at  Facility and her self.  Nausea and constipation poss from park and or meds .  B12  deficiency  ;injection today  Change f/u to Jan and bmp mg pre visit non fasting ok.

## 2012-08-04 NOTE — Patient Instructions (Signed)
Continue  Same medication    Move next appt to January 2014  Lab pre visit BMP doesn't have to be fasting.  b12 shots to continue.

## 2012-08-19 ENCOUNTER — Other Ambulatory Visit: Payer: Self-pay | Admitting: Internal Medicine

## 2012-09-01 ENCOUNTER — Ambulatory Visit: Payer: Medicare Other

## 2012-09-01 ENCOUNTER — Ambulatory Visit (INDEPENDENT_AMBULATORY_CARE_PROVIDER_SITE_OTHER): Payer: Medicare Other | Admitting: Family Medicine

## 2012-09-01 DIAGNOSIS — D518 Other vitamin B12 deficiency anemias: Secondary | ICD-10-CM

## 2012-09-01 MED ORDER — CYANOCOBALAMIN 1000 MCG/ML IJ SOLN
1000.0000 ug | Freq: Once | INTRAMUSCULAR | Status: AC
  Administered 2012-09-01: 1000 ug via INTRAMUSCULAR

## 2012-09-17 ENCOUNTER — Other Ambulatory Visit: Payer: Self-pay | Admitting: Internal Medicine

## 2012-09-29 ENCOUNTER — Ambulatory Visit (INDEPENDENT_AMBULATORY_CARE_PROVIDER_SITE_OTHER): Payer: Medicare Other | Admitting: Family Medicine

## 2012-09-29 DIAGNOSIS — D518 Other vitamin B12 deficiency anemias: Secondary | ICD-10-CM

## 2012-09-29 DIAGNOSIS — Z23 Encounter for immunization: Secondary | ICD-10-CM

## 2012-09-29 DIAGNOSIS — D519 Vitamin B12 deficiency anemia, unspecified: Secondary | ICD-10-CM

## 2012-09-29 MED ORDER — CYANOCOBALAMIN 1000 MCG/ML IJ SOLN
1000.0000 ug | Freq: Once | INTRAMUSCULAR | Status: AC
  Administered 2012-09-29: 1000 ug via INTRAMUSCULAR

## 2012-10-26 ENCOUNTER — Ambulatory Visit (INDEPENDENT_AMBULATORY_CARE_PROVIDER_SITE_OTHER): Payer: Medicare Other | Admitting: Internal Medicine

## 2012-10-26 ENCOUNTER — Encounter: Payer: Self-pay | Admitting: Internal Medicine

## 2012-10-26 VITALS — BP 148/70 | HR 74 | Temp 98.2°F | Wt 126.0 lb

## 2012-10-26 DIAGNOSIS — K5900 Constipation, unspecified: Secondary | ICD-10-CM

## 2012-10-26 DIAGNOSIS — G20 Parkinson's disease: Secondary | ICD-10-CM

## 2012-10-26 DIAGNOSIS — D518 Other vitamin B12 deficiency anemias: Secondary | ICD-10-CM

## 2012-10-26 DIAGNOSIS — I10 Essential (primary) hypertension: Secondary | ICD-10-CM

## 2012-10-26 DIAGNOSIS — R110 Nausea: Secondary | ICD-10-CM

## 2012-10-26 DIAGNOSIS — E876 Hypokalemia: Secondary | ICD-10-CM

## 2012-10-26 LAB — CBC WITH DIFFERENTIAL/PLATELET
Basophils Relative: 0.4 % (ref 0.0–3.0)
Eosinophils Relative: 1.2 % (ref 0.0–5.0)
HCT: 39.7 % (ref 36.0–46.0)
Lymphs Abs: 1.5 10*3/uL (ref 0.7–4.0)
MCV: 87.3 fl (ref 78.0–100.0)
Monocytes Absolute: 0.8 10*3/uL (ref 0.1–1.0)
Neutro Abs: 5.6 10*3/uL (ref 1.4–7.7)
Platelets: 244 10*3/uL (ref 150.0–400.0)
WBC: 7.9 10*3/uL (ref 4.5–10.5)

## 2012-10-26 LAB — BASIC METABOLIC PANEL
BUN: 12 mg/dL (ref 6–23)
Chloride: 98 mEq/L (ref 96–112)
Potassium: 3.3 mEq/L — ABNORMAL LOW (ref 3.5–5.1)

## 2012-10-26 LAB — POCT URINALYSIS DIPSTICK
Bilirubin, UA: NEGATIVE
Blood, UA: NEGATIVE
Ketones, UA: NEGATIVE
Spec Grav, UA: 1.015
pH, UA: 8.5

## 2012-10-26 LAB — MAGNESIUM: Magnesium: 1.6 mg/dL (ref 1.5–2.5)

## 2012-10-26 MED ORDER — PANTOPRAZOLE SODIUM 40 MG PO TBEC: 40.0000 mg | DELAYED_RELEASE_TABLET | Freq: Every day | ORAL | Status: DC

## 2012-10-26 MED ORDER — CYANOCOBALAMIN 1000 MCG/ML IJ SOLN
1000.0000 ug | Freq: Once | INTRAMUSCULAR | Status: AC
  Administered 2012-10-26: 1000 ug via INTRAMUSCULAR

## 2012-10-26 NOTE — Patient Instructions (Addendum)
Will notify you  of labs when available. This could be from an acid refulx problem or slowing of your stomach fucntion at this time would  Change the prilosec to protonix . Would have Dr Juanda Chance evaluate if not getting better .  Quickly

## 2012-10-26 NOTE — Progress Notes (Signed)
Chief Complaint  Patient presents with  . Nausea    Started over the weekend.  Unable to eat food.    HPI: Patient comes in today for SDA for  new problem evaluation. See above; onset of nausea  Feeling like This weekend without vomiting diarrhea fever chills or uri sx.  No one else sick; battles nausea off and on like stomach full   Was supposed to increase parkinson med  And fu neuro but hasnt yet because of this. Has had low K in past and some sx with this. Has problems with constipation problematic also. No specific HB cough at present  No syncope change in tremor falling bleeding .  ROS: See pertinent positives and negatives per HPI.  Past Medical History  Diagnosis Date  . HYPERLIPIDEMIA 02/21/2009  . HYPOKALEMIA 08/17/2007  . ANEMIA, B12 DEFICIENCY 03/13/2009  . HYPERTENSION 05/14/2007  . Blood in stool 01/25/2008  . OSTEOARTHRITIS 05/14/2007  . OSTEOPENIA 05/14/2007    2007 hx of fosamax use   . TREMOR 07/08/2007  . FREQUENCY, URINARY 08/19/2008  . LUMBAR STRAIN 09/21/2007  . Gastric polyp     panendo  5 05/12/08  . History of ankle fracture   . Parkinson's disease   . Adenomatous colon polyp     Family History  Problem Relation Age of Onset  . Heart attack Mother   . Coronary artery disease Mother   . Colon cancer Sister 59  . Breast cancer Sister     x 4  . Parkinsonism Brother   . Peripheral vascular disease      sibling  . Stroke Father     History   Social History  . Marital Status: Widowed    Spouse Name: N/A    Number of Children: 5  . Years of Education: N/A   Occupational History  .     Social History Main Topics  . Smoking status: Never Smoker   . Smokeless tobacco: Never Used  . Alcohol Use: No  . Drug Use: No  . Sexually Active: None   Other Topics Concern  . None   Social History Narrative   Husband died in [facility]osp[facility] in 12-May-2008 of leukemiaWidowedRecently [id number]    Outpatient Prescriptions Prior  to Visit  Medication Sig Dispense Refill  . aspirin 81 MG tablet Take 81 mg by mouth daily.        . carbidopa-levodopa (SINEMET IR) 25-100 MG per tablet Take 1 tablet by mouth 2 (two) times daily.      . cholecalciferol (VITAMIN D) 1000 UNITS tablet Take 1,000 Units by mouth daily.        . Clobetasol Propionate 0.05 % lotion Apply topically daily.       . cyanocobalamin 1000 MCG/ML injection Inject into the muscle.        . docusate sodium (COLACE) 100 MG capsule Take 100 mg by mouth 2 (two) times daily.        . hydrocortisone (ANUSOL-HC) 25 MG suppository INSERT 1 SUPPOSITORY RECTALLY AT BEDTIME  12 suppository  0  . Polyethyl Glycol-Propyl Glycol (SYSTANE) 0.4-0.3 % SOLN Apply to eye.       Marland Kitchen PRILOSEC OTC 20 MG tablet TAKE 1 TABLET BY MOUTH EVERY DAY  90 tablet  1  . propranolol ER (INDERAL LA) 120 MG 24 hr capsule Take 1 capsule (120 mg total) by mouth daily.  30 capsule  0  . Psyllium-Calcium (METAMUCIL PLUS CALCIUM PO) Take by  mouth.        . triamterene-hydrochlorothiazide (MAXZIDE-25) 37.5-25 MG per tablet TAKE 1 TABLET BY MOUTH EVERY DAY  30 tablet  4   Facility-Administered Medications Prior to Visit  Medication Dose Route Frequency Provider Last Rate Last Dose  . cyanocobalamin injection 1,000 mcg  1,000 mcg Intramuscular Q30 days Madelin Headings, MD   1,000 mcg at 06/08/12 1553     EXAM:  BP 148/70  Pulse 74  Temp 98.2 F (36.8 C) (Oral)  Wt 126 lb (57.153 kg)  SpO2 98%  There is no height on file to calculate BMI.  GENERAL: vitals reviewed and listed above, alert, oriented, appears well hydrated and in no acute distress tremor lip and hands resting   HEENT: atraumatic, conjunctiva  clear, no obvious abnormalities on inspection of external nose and ears OP : no lesion edema or exudate  Ear not bulging   NECK: no obvious masses on inspection palpation   LUNGS: clear to auscultation bilaterally, no wheezes, rales or rhonchi, good air movement Repeat bp 144/70 right  sitting CV: HRRR, no clubbing cyanosis or  peripheral edema nl cap refill bdomen:  Sof,t normal bowel sounds without hepatosplenomegaly, no guarding rebound or masses no CVA tenderness MS: moves all extremities without noticeable focal  abnormality  PSYCH: pleasant and cooperative, no obvious depression or anxiety  ASSESSMENT AND PLAN:  Discussed the following assessment and plan:  1. Nausea ? early satiety     ? med vs reflux disease vs gastrparesis sx lab r/o uti change to protonix and arrange to see Dr Juanda Chance  2. HYPERTENSION  Basic metabolic panel, CBC with Differential, Magnesium, POCT urinalysis dipstick   slightly elevated today 148 range  on  inderal for tremor also  3. HYPOKALEMIA  Basic metabolic panel, CBC with Differential, Magnesium, POCT urinalysis dipstick  4. ANEMIA, B12 DEFICIENCY  Basic metabolic panel, CBC with Differential, Magnesium, POCT urinalysis dipstick, cyanocobalamin ((VITAMIN B-12)) injection 1,000 mcg   injec today   5. Parkinsonian tremor  Basic metabolic panel, CBC with Differential, Magnesium, POCT urinalysis dipstick   fu with neuro   6. Constipation  Basic metabolic panel, CBC with Differential, Magnesium, POCT urinalysis dipstick    -Patient advised to return or notify health care team  immediately if symptoms worsen or persist or new concerns arise.  Patient Instructions  Will notify you  of labs when available. This could be from an acid refulx problem or slowing of your stomach fucntion at this time would  Change the prilosec to protonix . Would have Dr Juanda Chance evaluate if not getting better .  Quickly    Neta Mends. Duanne Duchesne M.D.

## 2012-10-27 ENCOUNTER — Ambulatory Visit: Payer: Medicare Other | Admitting: Family Medicine

## 2012-10-27 ENCOUNTER — Ambulatory Visit: Payer: Medicare Other | Admitting: Internal Medicine

## 2012-10-30 ENCOUNTER — Telehealth: Payer: Self-pay | Admitting: Internal Medicine

## 2012-10-30 ENCOUNTER — Other Ambulatory Visit: Payer: Self-pay | Admitting: Family Medicine

## 2012-10-30 DIAGNOSIS — E876 Hypokalemia: Secondary | ICD-10-CM

## 2012-10-30 MED ORDER — POTASSIUM CHLORIDE ER 10 MEQ PO TBCR: 10.0000 meq | EXTENDED_RELEASE_TABLET | Freq: Every day | ORAL | Status: DC

## 2012-10-30 NOTE — Telephone Encounter (Signed)
Patient given results of her lab work by telephone.

## 2012-10-30 NOTE — Telephone Encounter (Signed)
Pt returned call from [place]. Pls call back after 3:30pm today on home #.

## 2012-11-02 ENCOUNTER — Telehealth: Payer: Self-pay | Admitting: Internal Medicine

## 2012-11-02 NOTE — Telephone Encounter (Signed)
Patient calling to ask if it is ok to be taking the new script for Potassium along with her Maxide.  Explained that this was a potassium sparing diuretic, meaning it does not deplete the potassium as some diuretics do.  She was prescribed the extra low dose potassium due to a low level and has a recheck coming up and it is safe to take the medications.  States understanding, no further questions.

## 2012-11-19 ENCOUNTER — Other Ambulatory Visit: Payer: Self-pay | Admitting: Internal Medicine

## 2012-11-19 DIAGNOSIS — Z1231 Encounter for screening mammogram for malignant neoplasm of breast: Secondary | ICD-10-CM

## 2012-11-23 ENCOUNTER — Ambulatory Visit (INDEPENDENT_AMBULATORY_CARE_PROVIDER_SITE_OTHER): Payer: Medicare Other | Admitting: Family Medicine

## 2012-11-23 ENCOUNTER — Other Ambulatory Visit (INDEPENDENT_AMBULATORY_CARE_PROVIDER_SITE_OTHER): Payer: Medicare Other

## 2012-11-23 DIAGNOSIS — E876 Hypokalemia: Secondary | ICD-10-CM

## 2012-11-23 DIAGNOSIS — D518 Other vitamin B12 deficiency anemias: Secondary | ICD-10-CM

## 2012-11-23 DIAGNOSIS — D519 Vitamin B12 deficiency anemia, unspecified: Secondary | ICD-10-CM

## 2012-11-23 LAB — BASIC METABOLIC PANEL
CO2: 29 mEq/L (ref 19–32)
Calcium: 9 mg/dL (ref 8.4–10.5)
Chloride: 97 mEq/L (ref 96–112)
Creatinine, Ser: 1.2 mg/dL (ref 0.4–1.2)
Sodium: 135 mEq/L (ref 135–145)

## 2012-11-23 MED ORDER — CYANOCOBALAMIN 1000 MCG/ML IJ SOLN
1000.0000 ug | Freq: Once | INTRAMUSCULAR | Status: AC
  Administered 2012-11-23: 1000 ug via INTRAMUSCULAR

## 2012-12-16 ENCOUNTER — Other Ambulatory Visit: Payer: Self-pay | Admitting: Internal Medicine

## 2012-12-29 ENCOUNTER — Encounter: Payer: Self-pay | Admitting: Internal Medicine

## 2012-12-29 ENCOUNTER — Ambulatory Visit (INDEPENDENT_AMBULATORY_CARE_PROVIDER_SITE_OTHER): Payer: Medicare Other | Admitting: Internal Medicine

## 2012-12-29 VITALS — BP 140/70 | HR 64 | Temp 98.0°F | Wt 127.0 lb

## 2012-12-29 DIAGNOSIS — I10 Essential (primary) hypertension: Secondary | ICD-10-CM

## 2012-12-29 DIAGNOSIS — D518 Other vitamin B12 deficiency anemias: Secondary | ICD-10-CM

## 2012-12-29 DIAGNOSIS — K5900 Constipation, unspecified: Secondary | ICD-10-CM

## 2012-12-29 DIAGNOSIS — E876 Hypokalemia: Secondary | ICD-10-CM

## 2012-12-29 DIAGNOSIS — R110 Nausea: Secondary | ICD-10-CM

## 2012-12-29 DIAGNOSIS — H6980 Other specified disorders of Eustachian tube, unspecified ear: Secondary | ICD-10-CM

## 2012-12-29 LAB — BASIC METABOLIC PANEL
Chloride: 100 mEq/L (ref 96–112)
GFR: 50.21 mL/min — ABNORMAL LOW (ref >60.00)
Potassium: 3.6 mEq/L (ref 3.5–5.1)
Sodium: 136 mEq/L (ref 135–145)

## 2012-12-29 LAB — MAGNESIUM: Magnesium: 1.6 mg/dL (ref 1.5–2.5)

## 2012-12-29 MED ORDER — FLUTICASONE PROPIONATE 50 MCG/ACT NA SUSP: NASAL | Status: DC

## 2012-12-29 MED ORDER — CYANOCOBALAMIN 1000 MCG/ML IJ SOLN
1000.0000 ug | Freq: Once | INTRAMUSCULAR | Status: AC
  Administered 2012-12-29: 1000 ug via INTRAMUSCULAR

## 2012-12-29 NOTE — Patient Instructions (Signed)
bp is about 140/70 on my check  Stay on the protonix   And same meds   Will notify you  of labs when available. If ok then OV in 6 months  Or as needed

## 2012-12-29 NOTE — Progress Notes (Signed)
Chief Complaint  Patient presents with  . Follow-up    nausea  . Hypertension    HPI: Patient comes in today for follow up of  multiple medical problems.  bp nausea and medications hasn't really checked her readings recently  Busy with things. Her nausea is much better on the proton aches. And her appetite is coming back. Constipation still an issues  Takes metamucil and stool softener.  May go only  every other or every 3 days  Enjoying food better   ROS: See pertinent positives and negatives per HPI. Am congestion and ocass left ear pain  ? Jaw. Wonders if she has eustachian tube dysfunction no falling more active did see dermatologist to put her on a cream for redness in the lower abdominal inguinal area. It is getting better. Is considering the blue light treatment for her face for actinic changes. Due for neurology followup in the next month.  Past Medical History  Diagnosis Date  . HYPERLIPIDEMIA 02/21/2009  . HYPOKALEMIA 08/17/2007  . ANEMIA, B12 DEFICIENCY 03/13/2009  . HYPERTENSION 05/14/2007  . Blood in stool 01/25/2008  . OSTEOARTHRITIS 05/14/2007  . OSTEOPENIA 05/14/2007    2007 hx of fosamax use   . TREMOR 07/08/2007  . FREQUENCY, URINARY 08/19/2008  . LUMBAR STRAIN 09/21/2007  . Gastric polyp     panendo  5 2008/05/03  . History of ankle fracture   . Parkinson's disease   . Adenomatous colon polyp     Family History  Problem Relation Age of Onset  . Heart attack Mother   . Coronary artery disease Mother   . Colon cancer Sister 21  . Breast cancer Sister     x 4  . Parkinsonism Brother   . Peripheral vascular disease      sibling  . Stroke Father     History   Social History  . Marital Status: Widowed    Spouse Name: N/A    Number of Children: 5  . Years of Education: N/A   Occupational History  .     Social History Main Topics  . Smoking status: Never Smoker   . Smokeless tobacco: Never Used  . Alcohol Use: No  . Drug Use: No  . Sexually Active: None    Other Topics Concern  . None   Social History Narrative   Husband died in Hospice Care in May 03, 2008 of leukemiaWidowedRecently moved [facility]Retirement [id number]    Outpatient Encounter Prescriptions as of 12/29/2012  Medication Sig Dispense Refill  . aspirin 81 MG tablet Take 81 mg by mouth daily.        . carbidopa-levodopa (SINEMET IR) 25-100 MG per tablet Take 1 tablet by mouth 2 (two) times daily.      . Cholecalciferol (VITAMIN D3) 1000 UNITS CAPS Take by mouth.      . Clobetasol Propionate 0.05 % lotion Apply topically daily.       . cyanocobalamin 1000 MCG/ML injection Inject into the muscle.        . docusate sodium (COLACE) 100 MG capsule Take 100 mg by mouth 2 (two) times daily.        Marland Kitchen ketoconazole (NIZORAL) 2 % cream Apply topically 2 (two) times daily.      . pantoprazole (PROTONIX) 40 MG tablet Take 1 tablet (40 mg total) by mouth daily.  30 tablet  3  . Polyethyl Glycol-Propyl Glycol (SYSTANE) 0.4-0.3 % SOLN Apply to eye.       . potassium chloride (  K-DUR) 10 MEQ tablet Take 1 tablet (10 mEq total) by mouth daily.  30 tablet  2  . propranolol ER (INDERAL LA) 120 MG 24 hr capsule Take 1 capsule (120 mg total) by mouth daily.  30 capsule  0  . Psyllium-Calcium (METAMUCIL PLUS CALCIUM PO) Take by mouth.        . triamterene-hydrochlorothiazide (MAXZIDE-25) 37.5-25 MG per tablet TAKE 1 TABLET BY MOUTH EVERY DAY  30 tablet  4  . fluticasone (FLONASE) 50 MCG/ACT nasal spray 2 spray each nostril qd  16 g  3  . hydrocortisone (ANUSOL-HC) 25 MG suppository INSERT 1 SUPPOSITORY RECTALLY AT BEDTIME  12 suppository  0  . [DISCONTINUED] cholecalciferol (VITAMIN D) 1000 UNITS tablet Take 1,000 Units by mouth daily.        . [DISCONTINUED] PRILOSEC OTC 20 MG tablet TAKE 1 TABLET BY MOUTH EVERY DAY  90 tablet  1  . [DISCONTINUED] propranolol ER (INDERAL LA) 120 MG 24 hr capsule TAKE ONE CAPSULE BY MOUTH EVERY DAY  30 capsule  6   Facility-Administered Encounter  Medications as of 12/29/2012  Medication Dose Route Frequency Provider Last Rate Last Dose  . [COMPLETED] cyanocobalamin ((VITAMIN B-12)) injection 1,000 mcg  1,000 mcg Intramuscular Once Madelin Headings, MD   1,000 mcg at 12/29/12 1155  . cyanocobalamin injection 1,000 mcg  1,000 mcg Intramuscular Q30 days Madelin Headings, MD   1,000 mcg at 06/08/12 1553    EXAM:  BP 140/70  Pulse 64  Temp 98 F (36.7 C) (Oral)  Wt 127 lb (57.607 kg)  There is no height on file to calculate BMI. Wt Readings from Last 3 Encounters:  12/29/12 127 lb (57.607 kg)  10/26/12 126 lb (57.153 kg)  08/04/12 133 lb (60.328 kg)      GENERAL: vitals reviewed and listed above, alert, oriented, appears well hydrated and in no acute distress tremors minimal today.  HEENT: atraumatic, conjunctiva  clear, no obvious abnormalities on inspection of external nose and ears tm intact  OP : no lesion edema or exudate   NECK: no obvious masses on inspection palpation  No adenopathy LUNGS: clear to auscultation bilaterally, no wheezes, rales or rhonchi, good air movement Abdomen soft without organomegaly guarding or rebound no bruits are heard CV: HRRR, no clubbing cyanosis or  peripheral edema nl cap refill   MS: moves all extremities without noticeable focal  Abnormality oa changes Gait  facile PSYCH: pleasant and cooperative, no obvious depression or anxiety Lab Results  Component Value Date   WBC 7.9 10/26/2012   HGB 13.0 10/26/2012   HCT 39.7 10/26/2012   PLT 244.0 10/26/2012   GLUCOSE 80 12/29/2012   CHOL 199 04/24/2012   TRIG 122.0 04/24/2012   HDL 69.30 04/24/2012   LDLDIRECT 101.1 04/16/2011   LDLCALC 105* 04/24/2012   ALT 10 04/24/2012   AST 17 04/24/2012   NA 136 12/29/2012   K 3.6 12/29/2012   CL 100 12/29/2012   CREATININE 1.1 12/29/2012   BUN 20 12/29/2012   CO2 30 12/29/2012   TSH 0.90 04/24/2012    ASSESSMENT AND PLAN:  Discussed the following assessment and plan:  1. HYPERTENSION  Cholecalciferol  (VITAMIN D3) 1000 UNITS CAPS, ketoconazole (NIZORAL) 2 % cream, Basic metabolic panel, Magnesium  2. HYPOKALEMIA  Cholecalciferol (VITAMIN D3) 1000 UNITS CAPS, ketoconazole (NIZORAL) 2 % cream, Basic metabolic panel, Magnesium  3. Constipation  Cholecalciferol (VITAMIN D3) 1000 UNITS CAPS, ketoconazole (NIZORAL) 2 % cream, Basic metabolic panel, Magnesium  4. ANEMIA, B12 DEFICIENCY  cyanocobalamin ((VITAMIN B-12)) injection 1,000 mcg  5. Eustachian tube dysfunction possible     trial of nasal steroids  6. NAUSEA     improved on protonix  will continue  see gi if alarm features    nausea stomach problems much better on the proton next we'll continue for now. Consider adding magnesium pill magnesium oxide over-the-counter if her magnesium is still borderline low and see if it helps her constipation. Since she is doing better we'll not follow up with Dr. Juanda Chance at this time followup in 6 months or as needed. After patient left we had talked about giving her Flonase to see if her nasal congestion eustachian tube changes would help we'll send in to the pharmacy. Give a trial of 2 weeks. -Patient advised to return or notify health care team  immediately if symptoms worsen or persist or new concerns arise.  Patient Instructions  bp is about 140/70 on my check  Stay on the protonix   And same meds   Will notify you  of labs when available. If ok then OV in 6 months  Or as needed    Burna Mortimer K. Panosh M.D.

## 2012-12-31 ENCOUNTER — Ambulatory Visit
Admission: RE | Admit: 2012-12-31 | Discharge: 2012-12-31 | Disposition: A | Discharge status: Home / Self Care | Payer: Medicare Other | Source: Ambulatory Visit | Attending: Internal Medicine | Admitting: Internal Medicine

## 2012-12-31 DIAGNOSIS — Z1231 Encounter for screening mammogram for malignant neoplasm of breast: Secondary | ICD-10-CM

## 2013-01-19 ENCOUNTER — Other Ambulatory Visit: Payer: Self-pay | Admitting: Internal Medicine

## 2013-01-26 ENCOUNTER — Ambulatory Visit: Payer: Medicare Other | Admitting: Family Medicine

## 2013-01-29 ENCOUNTER — Other Ambulatory Visit: Payer: Self-pay | Admitting: Internal Medicine

## 2013-02-03 ENCOUNTER — Ambulatory Visit (INDEPENDENT_AMBULATORY_CARE_PROVIDER_SITE_OTHER): Payer: Medicare Other | Admitting: Internal Medicine

## 2013-02-03 ENCOUNTER — Encounter: Payer: Self-pay | Admitting: Internal Medicine

## 2013-02-03 VITALS — BP 140/66 | HR 56 | Wt 129.0 lb

## 2013-02-03 DIAGNOSIS — J329 Chronic sinusitis, unspecified: Secondary | ICD-10-CM | POA: Insufficient documentation

## 2013-02-03 DIAGNOSIS — Z9889 Other specified postprocedural states: Secondary | ICD-10-CM

## 2013-02-03 DIAGNOSIS — H9202 Otalgia, left ear: Secondary | ICD-10-CM

## 2013-02-03 DIAGNOSIS — Z974 Presence of external hearing-aid: Secondary | ICD-10-CM

## 2013-02-03 DIAGNOSIS — H9209 Otalgia, unspecified ear: Secondary | ICD-10-CM

## 2013-02-03 MED ORDER — AMOXICILLIN-POT CLAVULANATE 875-125 MG PO TABS: 1.0000 | ORAL_TABLET | Freq: Two times a day (BID) | ORAL | Status: DC

## 2013-02-03 NOTE — Progress Notes (Signed)
Chief Complaint  Patient presents with  . Otalgia    Left.  Has has the ear pain since the last time she was here.  The pain is not constant.    HPI: Patient comes in today for SDA for  new problem evaluation.  See last ov Eustachion  tube dysf and chronic nose congestinon and  Drippy some better    flonase helped congestion   Now has lef t ear pain not wearing hearing aid   Dr Sheran Spine said to check here first .  ? Concern about hearing aid hurting the area some pain down ? Jaw inc pnd left throat  No fever cp sob . Uncertain what to do next .  ROS: See pertinent positives and negatives per HPI.  Past Medical History  Diagnosis Date  . HYPERLIPIDEMIA 02/21/2009  . HYPOKALEMIA 08/17/2007  . ANEMIA, B12 DEFICIENCY 03/13/2009  . HYPERTENSION 05/14/2007  . Blood in stool 01/25/2008  . OSTEOARTHRITIS 05/14/2007  . OSTEOPENIA 05/14/2007    2007 hx of fosamax use   . TREMOR 07/08/2007  . FREQUENCY, URINARY 08/19/2008  . LUMBAR STRAIN 09/21/2007  . Gastric polyp     panendo  5 2008-05-04  . History of ankle fracture   . Parkinson's disease   . Adenomatous colon polyp     Family History  Problem Relation Age of Onset  . Heart attack Mother   . Coronary artery disease Mother   . Colon cancer Sister 79  . Breast cancer Sister     x 4  . Parkinsonism Brother   . Peripheral vascular disease      sibling  . Stroke Father     History   Social History  . Marital Status: Widowed    Spouse Name: N/A    Number of Children: 5  . Years of Education: N/A   Occupational History  .     Social History Main Topics  . Smoking status: Never Smoker   . Smokeless tobacco: Never Used  . Alcohol Use: No  . Drug Use: No  . Sexually Active: None   Other Topics Concern  . None   Social History Narrative   Husband died in Hospice Care in 05/04/08 of leukemia   Widowed   Recently moved to   [facility]irement assisted living home.   [facility]   [id number]    Outpatient Encounter Prescriptions as of  02/03/2013  Medication Sig Dispense Refill  . aspirin 81 MG tablet Take 81 mg by mouth daily.        . carbidopa-levodopa (SINEMET IR) 25-100 MG per tablet Take 1 tablet by mouth 2 (two) times daily.      . Cholecalciferol (VITAMIN D3) 1000 UNITS CAPS Take by mouth.      . Clobetasol Propionate 0.05 % lotion Apply topically daily.       . cyanocobalamin 1000 MCG/ML injection Inject into the muscle.        . docusate sodium (COLACE) 100 MG capsule Take 100 mg by mouth 2 (two) times daily.        . fluticasone (FLONASE) 50 MCG/ACT nasal spray 2 spray each nostril qd  16 g  3  . hydrocortisone (ANUSOL-HC) 25 MG suppository INSERT 1 SUPPOSITORY RECTALLY AT BEDTIME  12 suppository  0  . ketoconazole (NIZORAL) 2 % cream Apply topically 2 (two) times daily.      Marland Kitchen KLOR-CON M10 10 MEQ tablet TAKE 1 TABLET (10 MEQ TOTAL) BY MOUTH DAILY.  30  tablet  2  . pantoprazole (PROTONIX) 40 MG tablet Take 1 tablet (40 mg total) by mouth daily.  30 tablet  3  . Polyethyl Glycol-Propyl Glycol (SYSTANE) 0.4-0.3 % SOLN Apply to eye.       . propranolol ER (INDERAL LA) 120 MG 24 hr capsule Take 1 capsule (120 mg total) by mouth daily.  30 capsule  0  . Psyllium-Calcium (METAMUCIL PLUS CALCIUM PO) Take by mouth.        . triamterene-hydrochlorothiazide (MAXZIDE-25) 37.5-25 MG per tablet TAKE 1 TABLET BY MOUTH EVERY DAY  30 tablet  5  . amoxicillin-clavulanate (AUGMENTIN) 875-125 MG per tablet Take 1 tablet by mouth every 12 (twelve) hours.  20 tablet  0   Facility-Administered Encounter Medications as of 02/03/2013  Medication Dose Route Frequency Provider Last Rate Last Dose  . cyanocobalamin injection 1,000 mcg  1,000 mcg Intramuscular Q30 days Madelin Headings, MD   1,000 mcg at 06/08/12 1553    EXAM:  BP 140/66  Pulse 56  Wt 129 lb (58.514 kg)  BMI 24.39 kg/m2  SpO2 97%  Body mass index is 24.39 kg/(m^2).  GENERAL: vitals reviewed and listed above, alert, oriented, appears well hydrated and in no acute  distress  HEENT: atraumatic, conjunctiva  clear, no obvious abnormalities on inspection of external nose and ears congested no face pain  OP : no lesion edema or exudate  Some redness op area   Right eac basically clear and nl tm  Left eac non tendern  1+ wax but tm visualized ids grey and bony lm seem ok. No dc in ear . Has hearing aids. Face non tender  NECK: no obvious masses on inspection palpation ? Tender left ac area   LUNGS: clear to auscultation bilaterally, no wheezes, rales or rhonchi, good air movement  CV: HRRR, no clubbing cyanosis or  peripheral edema nl cap refill   PSYCH: pleasant and cooperative, no obvious depression or anxiety  ASSESSMENT AND PLAN:  Discussed the following assessment and plan:  Left ear pain - ? sinus referred pain vs other dont thibk the mod sm amt of wax is a factor tm is grey   Chronic rhinosinusitis  Sinusitis - rx for actute on chronic sinus left  and  araange referral to ent  continue flonase  -Patient advised to return or notify health care team  if symptoms worsen or persist or new concerns arise.  Patient Instructions  Uncertain why you're having somewhat problem with her left ear. It is possible you have secondary sinus infection. We will treat with antibiotic at this time continue on your Flonase and you can increase it to twice a day. We will send in an order for an ear nose and throat evaluation in the meantime.   Neta Mends. Pinkney Venard M.D.

## 2013-02-03 NOTE — Patient Instructions (Signed)
Uncertain why you're having somewhat problem with her left ear. It is possible you have secondary sinus infection. We will treat with antibiotic at this time continue on your Flonase and you can increase it to twice a day. We will send in an order for an ear nose and throat evaluation in the meantime.

## 2013-02-22 ENCOUNTER — Other Ambulatory Visit: Payer: Self-pay | Admitting: Internal Medicine

## 2013-03-08 ENCOUNTER — Telehealth: Payer: Self-pay | Admitting: Internal Medicine

## 2013-03-08 NOTE — Telephone Encounter (Signed)
pls advise

## 2013-03-08 NOTE — Telephone Encounter (Signed)
Caller states the office is currently out of VitB 12 shots. Caller would like to take a tablet supplement until the B12 shots are available again. Caller would like to know if the OTC Vitamin B12 5000 mcg tablet is okay to take. Caller bought it but wanted to ask first. PLEASE ADVISE.

## 2013-03-09 NOTE — Telephone Encounter (Signed)
Ok to take any  Of the preparations   Some may absorb better than others  .  There is a dissolvable version might be better but go ahead and take what she has for now

## 2013-03-10 NOTE — Telephone Encounter (Signed)
Call and advised pt to continue to check back with office on b12 injection.

## 2013-03-10 NOTE — Telephone Encounter (Signed)
Called and spoke with pt and pt is aware.  

## 2013-03-11 ENCOUNTER — Ambulatory Visit (INDEPENDENT_AMBULATORY_CARE_PROVIDER_SITE_OTHER): Payer: Medicare Other

## 2013-03-11 DIAGNOSIS — D518 Other vitamin B12 deficiency anemias: Secondary | ICD-10-CM

## 2013-03-11 DIAGNOSIS — D519 Vitamin B12 deficiency anemia, unspecified: Secondary | ICD-10-CM

## 2013-03-11 MED ORDER — CYANOCOBALAMIN 1000 MCG/ML IJ SOLN
1000.0000 ug | Freq: Once | INTRAMUSCULAR | Status: AC
  Administered 2013-03-11: 1000 ug via INTRAMUSCULAR

## 2013-03-12 ENCOUNTER — Ambulatory Visit: Payer: Medicare Other

## 2013-04-28 ENCOUNTER — Ambulatory Visit (INDEPENDENT_AMBULATORY_CARE_PROVIDER_SITE_OTHER): Payer: Medicare Other | Admitting: Family Medicine

## 2013-04-28 DIAGNOSIS — D519 Vitamin B12 deficiency anemia, unspecified: Secondary | ICD-10-CM

## 2013-04-28 DIAGNOSIS — D518 Other vitamin B12 deficiency anemias: Secondary | ICD-10-CM

## 2013-04-28 MED ORDER — CYANOCOBALAMIN 1000 MCG/ML IJ SOLN
1000.0000 ug | Freq: Once | INTRAMUSCULAR | Status: AC
  Administered 2013-04-28: 1000 ug via INTRAMUSCULAR

## 2013-05-03 ENCOUNTER — Other Ambulatory Visit: Payer: Self-pay | Admitting: Internal Medicine

## 2013-05-26 ENCOUNTER — Ambulatory Visit: Payer: Medicare Other | Admitting: Family Medicine

## 2013-05-27 ENCOUNTER — Encounter: Payer: Self-pay | Admitting: Internal Medicine

## 2013-05-31 ENCOUNTER — Other Ambulatory Visit: Payer: Self-pay | Admitting: Internal Medicine

## 2013-06-29 ENCOUNTER — Ambulatory Visit (INDEPENDENT_AMBULATORY_CARE_PROVIDER_SITE_OTHER): Payer: Medicare Other | Admitting: Internal Medicine

## 2013-06-29 ENCOUNTER — Encounter: Payer: Self-pay | Admitting: Internal Medicine

## 2013-06-29 VITALS — BP 136/60 | HR 59 | Temp 98.3°F | Wt 126.0 lb

## 2013-06-29 DIAGNOSIS — E876 Hypokalemia: Secondary | ICD-10-CM

## 2013-06-29 DIAGNOSIS — G20 Parkinson's disease: Secondary | ICD-10-CM

## 2013-06-29 DIAGNOSIS — M1990 Unspecified osteoarthritis, unspecified site: Secondary | ICD-10-CM

## 2013-06-29 DIAGNOSIS — I10 Essential (primary) hypertension: Secondary | ICD-10-CM

## 2013-06-29 DIAGNOSIS — D518 Other vitamin B12 deficiency anemias: Secondary | ICD-10-CM

## 2013-06-29 DIAGNOSIS — R82998 Other abnormal findings in urine: Secondary | ICD-10-CM

## 2013-06-29 DIAGNOSIS — R8290 Unspecified abnormal findings in urine: Secondary | ICD-10-CM

## 2013-06-29 LAB — POCT URINALYSIS DIP (MANUAL ENTRY)
Blood, UA: NEGATIVE
Nitrite, UA: NEGATIVE
Spec Grav, UA: 1.02
Urobilinogen, UA: 0.2
pH, UA: 7.5

## 2013-06-29 LAB — BASIC METABOLIC PANEL
BUN: 17 mg/dL (ref 6–23)
Calcium: 9.6 mg/dL (ref 8.4–10.5)
Chloride: 101 mEq/L (ref 96–112)
Creatinine, Ser: 1.1 mg/dL (ref 0.4–1.2)
GFR: 50.68 mL/min — ABNORMAL LOW (ref >60.00)

## 2013-06-29 LAB — TSH: TSH: 0.96 u[IU]/mL (ref 0.35–5.50)

## 2013-06-29 LAB — MAGNESIUM: Magnesium: 1.7 mg/dL (ref 1.5–2.5)

## 2013-06-29 MED ORDER — CYANOCOBALAMIN 1000 MCG/ML IJ SOLN
1000.0000 ug | Freq: Once | INTRAMUSCULAR | Status: AC
  Administered 2013-06-29: 1000 ug via INTRAMUSCULAR

## 2013-06-29 NOTE — Progress Notes (Signed)
Chief Complaint  Patient presents with  . Follow-up    HPI: Patient comes in today for follow up of  multiple medical problems.    BP  Not checking  But taking med taking magnesium 250 also  GERD sx   Taking  Med  Better than Prilosec.   Neuro tremors  End of day  More   Taking  Park med pre meal  And dec appetite .   Ketoconazole ointment for skin.  Per derm asks about poss IA withbother meds  b12 shots  Late  Out of supply  Taking oral ? Abn iurine odor and color .  Hard tot take sublingual   Taking tai chi and adaptive yoga no falling   Had ent check ear ok  Told to stay on nasal steroid  Urine has odor color changes? From vits? Always has frequency somesx    ROS: See pertinent positives and negatives per HPI.no falling cp sob injury   Past Medical History  Diagnosis Date  . HYPERLIPIDEMIA 02/21/2009  . HYPOKALEMIA 08/17/2007  . ANEMIA, B12 DEFICIENCY 03/13/2009  . HYPERTENSION 05/14/2007  . Blood in stool 01/25/2008  . OSTEOARTHRITIS 05/14/2007  . OSTEOPENIA 05/14/2007    2007 hx of fosamax use   . TREMOR 07/08/2007  . FREQUENCY, URINARY 08/19/2008  . LUMBAR STRAIN 09/21/2007  . Gastric polyp     panendo  5 30-Apr-2008  . History of ankle fracture   . Parkinson's disease   . Adenomatous colon polyp     Family History  Problem Relation Age of Onset  . Heart attack Mother   . Coronary artery disease Mother   . Colon cancer Sister 67  . Breast cancer Sister     x 4  . Parkinsonism Brother   . Peripheral vascular disease      sibling  . Stroke Father     History   Social History  . Marital Status: Widowed    Spouse Name: N/A    Number of Children: 5  . Years of Education: N/A   Occupational History  .     Social History Main Topics  . Smoking status: Never Smoker   . Smokeless tobacco: Never Used  . Alcohol Use: No  . Drug Use: No  . Sexually Active: None   Other Topics Concern  . None   Social History Narrative   Husband died in [facility] in 04-30-2008 of  leukemia   Widowed   Recently moved to   Retirement assisted living home.   [facility]   [id number]    Outpatient Encounter Prescriptions as of 06/29/2013  Medication Sig Dispense Refill  . aspirin 81 MG tablet Take 81 mg by mouth daily.        . carbidopa-levodopa (SINEMET IR) 25-100 MG per tablet Take 1 tablet by mouth 2 (two) times daily.      . Cholecalciferol (VITAMIN D3) 1000 UNITS CAPS Take by mouth.      . Clobetasol Propionate 0.05 % lotion Apply topically daily.       . Docusate Calcium ([employer] STOOL SOFTENER PO) Take by mouth.      . fluticasone (FLONASE) 50 MCG/ACT nasal spray 2 spray each nostril qd  16 g  3  . hydrocortisone (ANUSOL-HC) 25 MG suppository INSERT 1 SUPPOSITORY RECTALLY AT BEDTIME  12 suppository  0  . ketoconazole (NIZORAL) 2 % cream Apply topically 2 (two) times daily.      Marland Kitchen KLOR-CON M10 10 MEQ tablet TAKE  1 TABLET (10 MEQ TOTAL) BY MOUTH DAILY.  30 tablet  5  . pantoprazole (PROTONIX) 40 MG tablet TAKE 1 TABLET (40 MG TOTAL) BY MOUTH DAILY.  30 tablet  5  . Polyethyl Glycol-Propyl Glycol (SYSTANE) 0.4-0.3 % SOLN Apply to eye.       . [EXPIRED] propranolol ER (INDERAL LA) 120 MG 24 hr capsule Take 1 capsule (120 mg total) by mouth daily.  30 capsule  0  . Psyllium-Calcium (METAMUCIL PLUS CALCIUM PO) Take by mouth.        . triamterene-hydrochlorothiazide (MAXZIDE-25) 37.5-25 MG per tablet TAKE 1 TABLET BY MOUTH EVERY DAY  30 tablet  5  . cyanocobalamin 1000 MCG/ML injection Inject into the muscle.        . [DISCONTINUED] amoxicillin-clavulanate (AUGMENTIN) 875-125 MG per tablet Take 1 tablet by mouth every 12 (twelve) hours.  20 tablet  0  . [DISCONTINUED] docusate sodium (COLACE) 100 MG capsule Take 100 mg by mouth 2 (two) times daily.         Facility-Administered Encounter Medications as of 06/29/2013  Medication Dose Route Frequency Provider Last Rate Last Dose  . [COMPLETED] cyanocobalamin ((VITAMIN B-12)) injection 1,000 mcg  1,000 mcg Intramuscular Once  Madelin Headings, MD   1,000 mcg at 06/29/13 1146  . cyanocobalamin injection 1,000 mcg  1,000 mcg Intramuscular Q30 days Madelin Headings, MD   1,000 mcg at 06/08/12 1553    EXAM:  BP 136/60  Pulse 59  Temp(Src) 98.3 F (36.8 C) (Oral)  Wt 126 lb (57.153 kg)  BMI 23.82 kg/m2  SpO2 98%  Body mass index is 23.82 kg/(m^2).  GENERAL: vitals reviewed and listed above, alert, oriented, appears well hydrated and in no acute distress minor trmor pill roll hand  And lip at times   HEENT: atraumatic, conjunctiva  clear, no obvious abnormalities on inspection of external nose and ears OP : no lesion edema or exudate  Hearing aids  NECK: no obvious masses on inspection palpation  No adenopathy or bruit  LUNGS: clear to auscultation bilaterally, no wheezes, rales or rhonchi, good air movement CV: HRRR, no clubbing cyanosis or  peripheral edema nl cap refill  MS: moves all extremities  Gait   Non antalgic  OA changes PSYCH: pleasant and cooperative, no obvious depression or anxiety Lab Results  Component Value Date   WBC 7.9 10/26/2012   HGB 13.0 10/26/2012   HCT 39.7 10/26/2012   PLT 244.0 10/26/2012   GLUCOSE 80 06/29/2013   CHOL 199 04/24/2012   TRIG 122.0 04/24/2012   HDL 69.30 04/24/2012   LDLDIRECT 101.1 04/16/2011   LDLCALC 105* 04/24/2012   ALT 10 04/24/2012   AST 17 04/24/2012   NA 137 06/29/2013   K 3.6 06/29/2013   CL 101 06/29/2013   CREATININE 1.1 06/29/2013   BUN 17 06/29/2013   CO2 30 06/29/2013   TSH 0.96 06/29/2013   Wt Readings from Last 3 Encounters:  06/29/13 126 lb (57.153 kg)  02/03/13 129 lb (58.514 kg)  12/29/12 127 lb (57.607 kg)    ASSESSMENT AND PLAN:  Discussed the following assessment and plan:  HYPERTENSION - Plan: Basic metabolic panel, Magnesium, TSH, POCT urinalysis dipstick  OSTEOARTHRITIS - Plan: Basic metabolic panel, Magnesium, TSH, POCT urinalysis dipstick  Parkinsonian tremor - Plan: Basic metabolic panel, Magnesium, TSH, POCT urinalysis  dipstick  Abnormal urine odor - Plan: Basic metabolic panel, Magnesium, TSH, POCT urinalysis dipstick  HYPOKALEMIA  ANEMIA, B12 DEFICIENCY - Plan: cyanocobalamin ((VITAMIN B-12)) injection  1,000 mcg Does better on injections  Today  Late cause of shortage call ahead cont q 4 weeks bp good today no change med check mag k etc . GERD burping continue ppi for now.  Check ua for infection etc  If all ok then 6 month PV  -Patient advised to return or notify health care team  if symptoms worsen or persist or new concerns arise.  Patient Instructions  b12  immuniz today .  Continue same medications.   Can check  Labs today .   Magnesium and potassium.   Preventive visit in 6 months  Or as needed      Burna Mortimer K. Panosh M.D.

## 2013-06-29 NOTE — Patient Instructions (Addendum)
b12  immuniz today .  Continue same medications.   Can check  Labs today .   Magnesium and potassium.   Preventive visit in 6 months  Or as needed

## 2013-07-02 ENCOUNTER — Other Ambulatory Visit: Payer: Self-pay | Admitting: Family Medicine

## 2013-07-02 DIAGNOSIS — R8290 Unspecified abnormal findings in urine: Secondary | ICD-10-CM

## 2013-07-05 ENCOUNTER — Other Ambulatory Visit: Payer: Medicare Other

## 2013-07-05 DIAGNOSIS — R8290 Unspecified abnormal findings in urine: Secondary | ICD-10-CM

## 2013-07-08 ENCOUNTER — Encounter: Payer: Self-pay | Admitting: Family Medicine

## 2013-07-12 ENCOUNTER — Telehealth: Payer: Self-pay | Admitting: Internal Medicine

## 2013-07-12 NOTE — Telephone Encounter (Signed)
Left message for patient to call back  

## 2013-07-12 NOTE — Telephone Encounter (Signed)
Patient with constipation and rectal bleeding.  She would like to see Dr. Juanda Chance, offered an appt for 08/10/13, but she does not want to wait that long.  She will come in and see Doug Sou, PA tomorrow at 1:30

## 2013-07-13 ENCOUNTER — Ambulatory Visit (INDEPENDENT_AMBULATORY_CARE_PROVIDER_SITE_OTHER): Payer: Medicare Other | Admitting: Gastroenterology

## 2013-07-13 ENCOUNTER — Encounter: Payer: Self-pay | Admitting: Gastroenterology

## 2013-07-13 VITALS — BP 118/72 | HR 78 | Ht 62.0 in | Wt 123.7 lb

## 2013-07-13 DIAGNOSIS — K625 Hemorrhage of anus and rectum: Secondary | ICD-10-CM

## 2013-07-13 DIAGNOSIS — K5900 Constipation, unspecified: Secondary | ICD-10-CM | POA: Insufficient documentation

## 2013-07-13 DIAGNOSIS — R1084 Generalized abdominal pain: Secondary | ICD-10-CM

## 2013-07-13 DIAGNOSIS — R634 Abnormal weight loss: Secondary | ICD-10-CM

## 2013-07-13 DIAGNOSIS — R110 Nausea: Secondary | ICD-10-CM

## 2013-07-13 MED ORDER — HYDROCORTISONE ACETATE 25 MG RE SUPP: RECTAL | Status: DC

## 2013-07-13 MED ORDER — ONDANSETRON HCL 4 MG PO TABS: 4.0000 mg | ORAL_TABLET | Freq: Three times a day (TID) | ORAL | Status: DC | PRN

## 2013-07-13 NOTE — Patient Instructions (Addendum)
We have sent the following medications to your pharmacy for you to pick up at your convenience: Zofran and hydrocortisone suppositories.   Start Miralax over the counter mixing 17 grams in 8 oz of water daily for constipation.    You have been scheduled for a CT scan of the abdomen and pelvis at [facility] ([address]---this is in the same building as [facility]).   You are scheduled on 07/15/13 at 11:00am. You should arrive 15 minutes prior to your appointment time for registration. Please follow the written instructions below on the day of your exam:  WARNING: IF YOU ARE ALLERGIC TO IODINE/X-RAY DYE, PLEASE NOTIFY RADIOLOGY IMMEDIATELY AT [phone]! YOU WILL BE GIVEN A 13 HOUR PREMEDICATION PREP.  1) Do not eat or drink anything after 7:00am (4 hours prior to your test) 2) You have been given 2 bottles of oral contrast to drink. The solution may taste better if refrigerated, but do NOT add ice or any other liquid to this solution. Shake well before drinking.   Take one Zofran at 8:00am with a small sip of water.   Drink 1 bottle of contrast @ 9:00am (2 hours prior to your exam)  Drink 1 bottle of contrast @ 10:00am (1 hour prior to your exam)  You may take any medications as prescribed with a small amount of water except for the following: Metformin, Glucophage, Glucovance, Avandamet, Riomet, Fortamet, Actoplus Met, Janumet, Glumetza or Metaglip. The above medications must be held the day of the exam AND 48 hours after the exam.  The purpose of you drinking the oral contrast is to aid in the visualization of your intestinal tract. The contrast solution may cause some diarrhea. Before your exam is started, you will be given a small amount of fluid to drink. Depending on your individual set of symptoms, you may also receive an intravenous injection of x-ray contrast/dye. Plan on being at [facility] for 30 minutes or long, depending on the type of exam you are  having performed.  This test typically takes 30-45 minutes to complete.  If you have any questions regarding your exam or if you need to reschedule, you may call the CT department at [phone] between the hours of 8:00 am and 5:00 pm, Monday-Friday.  ________________________________________________________________________

## 2013-07-13 NOTE — Progress Notes (Signed)
Reviewed and agree.

## 2013-07-13 NOTE — Progress Notes (Signed)
07/13/2013 Kimberly Silva [id number] 24-Feb-1926  History of Present Illness:  Patient is a pleasant 79 year old white female who is known to Dr. Juanda Chance.  She presents to our office today with a few issues.  First, she has chronic constipation and takes metmucil and colace for that, which usually works well but recently her diet has not been great and the constipation has been worse.  She was very constipated the other day and was straining a lot.  When she finally had a BM she passed some blood with the stool.  The bleeding only happened on one occasion.  She has been using anusol suppositories intermittently for hemorrhoids in the past and would like a refill on those.  Last colonoscopy was 2003.  She also says that the past couple of days she has been having a lot of nausea and some dry heaves.  Had some right upper quadrant discomfort as well.  Appetite has not been great and she has lost about 13 pounds over the past year.   Current Medications, Allergies, Past Medical History, Past Surgical History, Family History and Social History were reviewed in [employer] record.   Physical Exam: BP 118/72  Pulse 78  Ht 5\' 2"  (1.575 m)  Wt 123 lb 11.2 oz (56.11 kg)  BMI 22.62 kg/m2  SpO2 98% General: Elderly white female in no acute distress Head: Normocephalic and atraumatic Eyes:  sclerae anicteric, conjunctiva pink  Ears: Normal auditory acuity Lungs: Clear throughout to auscultation Heart: Regular rate and rhythm Abdomen: Soft, non-distended.  BS present.  Non-tender. Musculoskeletal: Symmetrical with no gross deformities  Extremities: No edema  Neurological: Alert oriented x 4, grossly nonfocal Psychological:  Alert and cooperative. Normal mood and affect  Assessment and Recommendations: -Constipation:  Will discontinue Metamucil and colace.  Start Miralax once daily. -Rectal bleeding:  One episode associated with hard BM and straining.  Likely secondary to  hemorrhoids that were seen and treated by Dr. Juanda Chance at last visit two years ago.  Will refill anusol suppositories.   -Anorexia and weight loss with nausea and upper abdominal discomfort:  Will give prescription for zofran.  Schedule CT abdomen and pelvis with contrast.

## 2013-07-14 ENCOUNTER — Telehealth: Payer: Self-pay | Admitting: Gastroenterology

## 2013-07-14 ENCOUNTER — Other Ambulatory Visit: Payer: Self-pay | Admitting: Internal Medicine

## 2013-07-14 NOTE — Telephone Encounter (Signed)
Pt has Zofran and will try taking it and drinking the Contrast for the CT scan.

## 2013-07-15 ENCOUNTER — Ambulatory Visit (INDEPENDENT_AMBULATORY_CARE_PROVIDER_SITE_OTHER)
Admission: RE | Admit: 2013-07-15 | Discharge: 2013-07-15 | Disposition: A | Discharge status: Home / Self Care | Payer: Medicare Other | Source: Ambulatory Visit | Attending: Gastroenterology | Admitting: Gastroenterology

## 2013-07-15 DIAGNOSIS — R1084 Generalized abdominal pain: Secondary | ICD-10-CM

## 2013-07-15 DIAGNOSIS — R634 Abnormal weight loss: Secondary | ICD-10-CM

## 2013-07-15 DIAGNOSIS — R110 Nausea: Secondary | ICD-10-CM

## 2013-07-15 MED ORDER — IOHEXOL 300 MG/ML  SOLN
100.0000 mL | Freq: Once | INTRAMUSCULAR | Status: AC | PRN
  Administered 2013-07-15: 100 mL via INTRAVENOUS

## 2013-07-15 NOTE — Telephone Encounter (Signed)
Is pt taking both meds?

## 2013-07-16 ENCOUNTER — Other Ambulatory Visit (INDEPENDENT_AMBULATORY_CARE_PROVIDER_SITE_OTHER): Payer: Medicare Other

## 2013-07-16 ENCOUNTER — Encounter: Payer: Self-pay | Admitting: *Deleted

## 2013-07-16 ENCOUNTER — Ambulatory Visit (INDEPENDENT_AMBULATORY_CARE_PROVIDER_SITE_OTHER): Payer: Medicare Other | Admitting: Internal Medicine

## 2013-07-16 VITALS — BP 142/70 | HR 76 | Ht 61.0 in | Wt 124.0 lb

## 2013-07-16 DIAGNOSIS — K625 Hemorrhage of anus and rectum: Secondary | ICD-10-CM

## 2013-07-16 DIAGNOSIS — R933 Abnormal findings on diagnostic imaging of other parts of digestive tract: Secondary | ICD-10-CM

## 2013-07-16 LAB — CBC WITH DIFFERENTIAL/PLATELET
Basophils Absolute: 0.1 10*3/uL (ref 0.0–0.1)
Basophils Relative: 0.8 % (ref 0.0–3.0)
HCT: 39.9 % (ref 36.0–46.0)
Hemoglobin: 13.2 g/dL (ref 12.0–15.0)
Lymphs Abs: 1.5 10*3/uL (ref 0.7–4.0)
MCHC: 33.2 g/dL (ref 30.0–36.0)
Monocytes Relative: 13.4 % — ABNORMAL HIGH (ref 3.0–12.0)
Neutro Abs: 6.3 10*3/uL (ref 1.4–7.7)
RBC: 4.82 Mil/uL (ref 3.87–5.11)
RDW: 14 % (ref 11.5–14.6)

## 2013-07-16 MED ORDER — MOVIPREP 100 G PO SOLR: 1.0000 | Freq: Once | ORAL | Status: DC

## 2013-07-16 NOTE — Patient Instructions (Addendum)
You have been scheduled for a flexible sigmoidoscopy. Please follow the written instructions given to you at your visit today. If you use inhalers (even only as needed), please bring them with you on the day of your procedure.  Your physician has requested that you go to the basement for the following lab work before leaving today: CBC  CC: Dr Fabian Sharp

## 2013-07-16 NOTE — Progress Notes (Signed)
Kimberly Silva 11-Jul-1926 MRN [id number]   History of Present Illness:  This is an 79 year old white female with low-volume rectal bleeding. She saw Doug Sou, PA-C 2 weeks ago and was given Anusol-HC suppositories for hemorrhoids. She was scheduled for a CT scan of the abdomen and pelvis which showed abnormal segment of the rectosigmoid 3.5 cm long which was suspicious for malignancy. Patient has been having constipation and irregular bowel habits. She denies abdominal pain. She has had a weight loss of 6 pounds . She has a history of adenomatous colon polyps on colonoscopies in 1992, 1997, 2000 and 2004. There is a family history of colon cancer in her paternal aunt and sister.   Past Medical History  Diagnosis Date  . HYPERLIPIDEMIA 02/21/2009  . HYPOKALEMIA 08/17/2007  . ANEMIA, B12 DEFICIENCY 03/13/2009  . HYPERTENSION 05/14/2007  . Blood in stool 01/25/2008  . OSTEOARTHRITIS 05/14/2007  . OSTEOPENIA 05/14/2007    2007 hx of fosamax use   . TREMOR 07/08/2007  . FREQUENCY, URINARY 08/19/2008  . LUMBAR STRAIN 09/21/2007  . Gastric polyp     panendo  5 2009  . History of ankle fracture   . Parkinson's disease   . Adenomatous colon polyp    Past Surgical History  Procedure Laterality Date  . Abdominal hysterectomy    . Fracture  2007    left ankle- Hx of fosamax use  . Dilation and curettage of uterus    . Cataract extraction, bilateral      reports that she has never smoked. She has never used smokeless tobacco. She reports that she does not drink alcohol or use illicit drugs. family history includes Breast cancer in her sister; Colon cancer in her other, paternal aunt, and sister; Coronary artery disease in her mother; Heart attack in her mother; Parkinsonism in her brother; Peripheral vascular disease in an unspecified family member; and Stroke in her father. No Known Allergies      Review of Systems:  The remainder of the 10 point ROS is negative except as outlined in  H&P   Physical Exam: General appearance  Well developed, in no distress. Eyes- non icteric. HEENT nontraumatic, normocephalic. Mouth no lesions, tongue papillated, no cheilosis. Neck supple without adenopathy, thyroid not enlarged, no carotid bruits, no JVD. Lungs Clear to auscultation bilaterally. Cor normal S1, normal S2, regular rhythm, no murmur,  quiet precordium. Abdomen:Soft nontender abdomen with normal active bowel sounds. No distention. Liver edge at costal margin.  Rectal:Large external hemorrhoidal tags. Normal rectal sphincter tone. Hemoccult negative stool in the ampulla. No mass or irregular mucosa on the digital exam up to at least 8 cm.  Extremities no pedal edema. Skin no lesions. Neurological alert and oriented x 3. Psychological normal mood and affect.  Assessment and Plan:  Problem #41 79 year old white female with low-volume rectal bleeding which was attributed too external hemorrhoids. A CT scan of the abdomen has shown abnormal thickening in the rectosigmoid colon which needs to be further evaluated for possible carcinoma. She is Hemoccult negative on my exam and I am unable to feel any mass on my digital exam. She has a strong family history of colorectal cancer and personal history of adenomatous polyps. Her last full colonoscopy was in 2004. Patient absolutely refuses to prep for colonoscopy at this time but will go along with flexible sigmoidoscopy. I have told her that it would be preferable to do a colonoscopy but we can get by with flexible sigmoidoscopy to at least examine the  area in question. We will check her CBC today. Her last hemoglobin in November 2013 was 13.   07/16/2013 Kimberly Silva

## 2013-07-16 NOTE — Telephone Encounter (Signed)
Should be  As per chart   Please refill as such unless you see otherwise in record

## 2013-07-18 NOTE — Interval H&P Note (Signed)
History and Physical Interval Note:  07/18/2013 4:41 PM  Kimberly Silva  has presented today for surgery, with the diagnosis of abnormal CT  The various methods of treatment have been discussed with the patient and family. After consideration of risks, benefits and other options for treatment, the patient has consented to  Procedure(s): FLEXIBLE SIGMOIDOSCOPY (N/A) as a surgical intervention .  The patient's history has been reviewed, patient examined, no change in status, stable for surgery.  I have reviewed the patient's chart and labs.  Questions were answered to the patient's satisfaction.     Lina Sar

## 2013-07-18 NOTE — H&P (View-Only) (Signed)
Kimberly Silva 11/18/1926 MRN [id number]   History of Present Illness:  This is an 79-year-old white female with low-volume rectal bleeding. She saw Kimberly Zehr, PA-C 2 weeks ago and was given Anusol-HC suppositories for hemorrhoids. She was scheduled for a CT scan of the abdomen and pelvis which showed abnormal segment of the rectosigmoid 3.5 cm long which was suspicious for malignancy. Patient has been having constipation and irregular bowel habits. She denies abdominal pain. She has had a weight loss of 6 pounds . She has a history of adenomatous colon polyps on colonoscopies in 1992, 1997, 2000 and 2004. There is a family history of colon cancer in her paternal aunt and sister.   Past Medical History  Diagnosis Date  . HYPERLIPIDEMIA 02/21/2009  . HYPOKALEMIA 08/17/2007  . ANEMIA, B12 DEFICIENCY 03/13/2009  . HYPERTENSION 05/14/2007  . Blood in stool 01/25/2008  . OSTEOARTHRITIS 05/14/2007  . OSTEOPENIA 05/14/2007    2007 hx of fosamax use   . TREMOR 07/08/2007  . FREQUENCY, URINARY 08/19/2008  . LUMBAR STRAIN 09/21/2007  . Gastric polyp     panendo  5 2009  . History of ankle fracture   . Parkinson's disease   . Adenomatous colon polyp    Past Surgical History  Procedure Laterality Date  . Abdominal hysterectomy    . Fracture  2007    left ankle- Hx of fosamax use  . Dilation and curettage of uterus    . Cataract extraction, bilateral      reports that she has never smoked. She has never used smokeless tobacco. She reports that she does not drink alcohol or use illicit drugs. family history includes Breast cancer in her sister; Colon cancer in her other, paternal aunt, and sister; Coronary artery disease in her mother; Heart attack in her mother; Parkinsonism in her brother; Peripheral vascular disease in an unspecified family member; and Stroke in her father. No Known Allergies      Review of Systems:  The remainder of the 10 point ROS is negative except as outlined in  H&P   Physical Exam: General appearance  Well developed, in no distress. Eyes- non icteric. HEENT nontraumatic, normocephalic. Mouth no lesions, tongue papillated, no cheilosis. Neck supple without adenopathy, thyroid not enlarged, no carotid bruits, no JVD. Lungs Clear to auscultation bilaterally. Cor normal S1, normal S2, regular rhythm, no murmur,  quiet precordium. Abdomen:Soft nontender abdomen with normal active bowel sounds. No distention. Liver edge at costal margin.  Rectal:Large external hemorrhoidal tags. Normal rectal sphincter tone. Hemoccult negative stool in the ampulla. No mass or irregular mucosa on the digital exam up to at least 8 cm.  Extremities no pedal edema. Skin no lesions. Neurological alert and oriented x 3. Psychological normal mood and affect.  Assessment and Plan:  Problem #1 79-year-old white female with low-volume rectal bleeding which was attributed too external hemorrhoids. A CT scan of the abdomen has shown abnormal thickening in the rectosigmoid colon which needs to be further evaluated for possible carcinoma. She is Hemoccult negative on my exam and I am unable to feel any mass on my digital exam. She has a strong family history of colorectal cancer and personal history of adenomatous polyps. Her last full colonoscopy was in 2004. Patient absolutely refuses to prep for colonoscopy at this time but will go along with flexible sigmoidoscopy. I have told her that it would be preferable to do a colonoscopy but we can get by with flexible sigmoidoscopy to at least examine the   area in question. We will check her CBC today. Her last hemoglobin in November 2013 was 13.   07/16/2013 Kimberly Silva  

## 2013-07-18 NOTE — Telephone Encounter (Signed)
Please refill meds  For 12 months

## 2013-07-19 ENCOUNTER — Encounter (HOSPITAL_COMMUNITY)
Admission: RE | Disposition: A | Discharge status: Home / Self Care | Payer: Self-pay | Source: Ambulatory Visit | Attending: Internal Medicine

## 2013-07-19 ENCOUNTER — Encounter (HOSPITAL_COMMUNITY): Payer: Self-pay

## 2013-07-19 ENCOUNTER — Ambulatory Visit (HOSPITAL_COMMUNITY)
Admission: RE | Admit: 2013-07-19 | Discharge: 2013-07-19 | Disposition: A | Discharge status: Home / Self Care | Payer: Medicare Other | Source: Ambulatory Visit | Attending: Internal Medicine | Admitting: Internal Medicine

## 2013-07-19 DIAGNOSIS — M1990 Unspecified osteoarthritis, unspecified site: Secondary | ICD-10-CM | POA: Insufficient documentation

## 2013-07-19 DIAGNOSIS — E785 Hyperlipidemia, unspecified: Secondary | ICD-10-CM | POA: Insufficient documentation

## 2013-07-19 DIAGNOSIS — K921 Melena: Secondary | ICD-10-CM

## 2013-07-19 DIAGNOSIS — Z860100 Personal history of colon polyps, unspecified: Secondary | ICD-10-CM | POA: Insufficient documentation

## 2013-07-19 DIAGNOSIS — E538 Deficiency of other specified B group vitamins: Secondary | ICD-10-CM | POA: Insufficient documentation

## 2013-07-19 DIAGNOSIS — D129 Benign neoplasm of anus and anal canal: Secondary | ICD-10-CM | POA: Insufficient documentation

## 2013-07-19 DIAGNOSIS — G20A1 Parkinson's disease without dyskinesia, without mention of fluctuations: Secondary | ICD-10-CM | POA: Insufficient documentation

## 2013-07-19 DIAGNOSIS — Z823 Family history of stroke: Secondary | ICD-10-CM | POA: Insufficient documentation

## 2013-07-19 DIAGNOSIS — Z800 Family history of malignant neoplasm of digestive organs: Secondary | ICD-10-CM | POA: Insufficient documentation

## 2013-07-19 DIAGNOSIS — M899 Disorder of bone, unspecified: Secondary | ICD-10-CM | POA: Insufficient documentation

## 2013-07-19 DIAGNOSIS — I10 Essential (primary) hypertension: Secondary | ICD-10-CM | POA: Insufficient documentation

## 2013-07-19 DIAGNOSIS — R933 Abnormal findings on diagnostic imaging of other parts of digestive tract: Secondary | ICD-10-CM | POA: Insufficient documentation

## 2013-07-19 DIAGNOSIS — K625 Hemorrhage of anus and rectum: Secondary | ICD-10-CM

## 2013-07-19 DIAGNOSIS — G20 Parkinson's disease: Secondary | ICD-10-CM | POA: Insufficient documentation

## 2013-07-19 DIAGNOSIS — Z809 Family history of malignant neoplasm, unspecified: Secondary | ICD-10-CM

## 2013-07-19 DIAGNOSIS — D649 Anemia, unspecified: Secondary | ICD-10-CM | POA: Insufficient documentation

## 2013-07-19 DIAGNOSIS — Z803 Family history of malignant neoplasm of breast: Secondary | ICD-10-CM | POA: Insufficient documentation

## 2013-07-19 DIAGNOSIS — R259 Unspecified abnormal involuntary movements: Secondary | ICD-10-CM | POA: Insufficient documentation

## 2013-07-19 DIAGNOSIS — D126 Benign neoplasm of colon, unspecified: Secondary | ICD-10-CM

## 2013-07-19 DIAGNOSIS — Z8249 Family history of ischemic heart disease and other diseases of the circulatory system: Secondary | ICD-10-CM | POA: Insufficient documentation

## 2013-07-19 DIAGNOSIS — D128 Benign neoplasm of rectum: Secondary | ICD-10-CM | POA: Insufficient documentation

## 2013-07-19 DIAGNOSIS — K648 Other hemorrhoids: Secondary | ICD-10-CM | POA: Insufficient documentation

## 2013-07-19 DIAGNOSIS — R350 Frequency of micturition: Secondary | ICD-10-CM | POA: Insufficient documentation

## 2013-07-19 DIAGNOSIS — K644 Residual hemorrhoidal skin tags: Secondary | ICD-10-CM | POA: Insufficient documentation

## 2013-07-19 HISTORY — PX: COLONOSCOPY: SHX5424

## 2013-07-19 HISTORY — DX: Benign neoplasm of colon, unspecified: D12.6

## 2013-07-19 SURGERY — COLONOSCOPY
Anesthesia: Moderate Sedation

## 2013-07-19 MED ORDER — FENTANYL CITRATE 0.05 MG/ML IJ SOLN
INTRAMUSCULAR | Status: AC
  Filled 2013-07-19: qty 2

## 2013-07-19 MED ORDER — SODIUM CHLORIDE 0.9 % IV SOLN
INTRAVENOUS | Status: DC
  Administered 2013-07-19: 09:00:00 via INTRAVENOUS

## 2013-07-19 MED ORDER — DIPHENHYDRAMINE HCL 50 MG/ML IJ SOLN
INTRAMUSCULAR | Status: AC
  Filled 2013-07-19: qty 1

## 2013-07-19 MED ORDER — MIDAZOLAM HCL 10 MG/2ML IJ SOLN
INTRAMUSCULAR | Status: AC
  Filled 2013-07-19: qty 2

## 2013-07-19 MED ORDER — MIDAZOLAM HCL 10 MG/2ML IJ SOLN
INTRAMUSCULAR | Status: DC | PRN
  Administered 2013-07-19: 2 mg via INTRAVENOUS
  Administered 2013-07-19 (×2): 1 mg via INTRAVENOUS

## 2013-07-19 MED ORDER — FENTANYL CITRATE 0.05 MG/ML IJ SOLN
INTRAMUSCULAR | Status: DC | PRN
  Administered 2013-07-19 (×2): 25 ug via INTRAVENOUS

## 2013-07-19 NOTE — Interval H&P Note (Signed)
History and Physical Interval Note:  07/19/2013 9:37 AM  Kimberly Silva  has presented today for surgery, with the diagnosis of abnormal CT  The various methods of treatment have been discussed with the patient and family. After consideration of risks, benefits and other options for treatment, the patient has consented to  Procedure(s): FLEXIBLE SIGMOIDOSCOPY (N/A) as a surgical intervention .  The patient's history has been reviewed, patient examined, no change in status, stable for surgery.  I have reviewed the patient's chart and labs.  Questions were answered to the patient's satisfaction.     Lina Sar

## 2013-07-19 NOTE — Op Note (Signed)
[facility] [address] [place], [address]   COLONOSCOPY PROCEDURE REPORT  PATIENT: Kimberly Silva, Kimberly Silva  MR#: [id number] BIRTHDATE: [birth year] , 87  yrs. old GENDER: Female ENDOSCOPIST: Hart Carwin, MD REFERRED BY:  Madelin Headings, M.D. PROCEDURE DATE:  07/19/2013 PROCEDURE:   Colonoscopy with snare polypectomy ASA CLASS:   Class II INDICATIONS:hematochezia and CT scan shows thickened segment of rectosigmoid, stool is heme negative. Hgb 13.0, hx of adenomatous polyps, hx of colon cancer in sister, MEDICATIONS: These medications were titrated to patient response per physician's verbal order, Fentanyl 50 mcg IV, and Versed 4 mg IV  DESCRIPTION OF PROCEDURE:   After the risks and benefits and of the procedure were explained, informed consent was obtained.  A digital rectal exam revealed no abnormalities of the rectum.    The endoscope was introduced through the anus and advanced to the cecum, which was identified by both the appendix and ileocecal valve .  The quality of the prep was good. .  The instrument was then slowly withdrawn as the colon was fully examined.     COLON FINDINGS: A polypoid shaped sessile polyp ranging between 5-65mm in size was found in the rectum.  A polypectomy was performed using snare cautery.  The resection was complete and the polyp tissue was completely retrieved.   Small internal and external hemorrhoids were found.     Retroflexed views revealed no abnormalities.     The scope was then withdrawn from the patient and the procedure completed.  COMPLICATIONS: There were no complications. ENDOSCOPIC IMPRESSION: 1.   Sessile polyp ranging between 5-67mm in size was found in the rectum; polypectomy was performed using snare cautery 2.   Small internal and external hemorrhoids , likely cause of bleeding nothing to explain abnormal findings on CT scan, I suspect nonspecific hypertrophy of the sigmoid colon explains the  CT  scan finding, bleeding  attributed to hemorrhoids  RECOMMENDATIONS: 1.  Await pathology results 2.  High fiber diet   REPEAT EXAM: for No recall due to age..  cc:  _______________________________ eSignedHart Carwin, MD 07/19/2013 10:14 AM     PATIENT NAME:  Breda, Bond MR#: [id number]

## 2013-07-19 NOTE — Telephone Encounter (Signed)
Pt had colonoscopy and would like misty to return her call

## 2013-07-20 ENCOUNTER — Telehealth: Payer: Self-pay | Admitting: Internal Medicine

## 2013-07-20 ENCOUNTER — Encounter (HOSPITAL_COMMUNITY): Payer: Self-pay | Admitting: Internal Medicine

## 2013-07-20 ENCOUNTER — Encounter: Payer: Self-pay | Admitting: Internal Medicine

## 2013-07-20 NOTE — Telephone Encounter (Signed)
Pt is worried because no discharge instructions or f/u appt. Explained to pt Dr Juanda Chance will give [place] instructions after bx results have been received. I will put a note in for her RN to call Friday to see if she has been given results; pt stated understanding.

## 2013-07-21 ENCOUNTER — Telehealth: Payer: Self-pay | Admitting: Internal Medicine

## 2013-07-21 NOTE — Telephone Encounter (Signed)
Spoke with patient and discussed using Miralax prn and she read off her discharge instructions per D.r Juanda Chance that she could use one or two magnesium tablets/day  Prn.

## 2013-07-25 ENCOUNTER — Other Ambulatory Visit: Payer: Self-pay | Admitting: Internal Medicine

## 2013-07-27 ENCOUNTER — Ambulatory Visit (INDEPENDENT_AMBULATORY_CARE_PROVIDER_SITE_OTHER): Payer: Medicare Other | Admitting: Family Medicine

## 2013-07-27 DIAGNOSIS — D518 Other vitamin B12 deficiency anemias: Secondary | ICD-10-CM

## 2013-07-27 DIAGNOSIS — D519 Vitamin B12 deficiency anemia, unspecified: Secondary | ICD-10-CM

## 2013-07-27 MED ORDER — CYANOCOBALAMIN 1000 MCG/ML IJ SOLN
1000.0000 ug | Freq: Once | INTRAMUSCULAR | Status: AC
  Administered 2013-07-27: 1000 ug via INTRAMUSCULAR

## 2013-08-04 ENCOUNTER — Ambulatory Visit: Payer: Self-pay | Admitting: Diagnostic Neuroimaging

## 2013-08-25 ENCOUNTER — Ambulatory Visit (INDEPENDENT_AMBULATORY_CARE_PROVIDER_SITE_OTHER): Payer: Medicare Other | Admitting: Family Medicine

## 2013-08-25 DIAGNOSIS — D518 Other vitamin B12 deficiency anemias: Secondary | ICD-10-CM

## 2013-08-25 DIAGNOSIS — D519 Vitamin B12 deficiency anemia, unspecified: Secondary | ICD-10-CM

## 2013-08-25 MED ORDER — CYANOCOBALAMIN 1000 MCG/ML IJ SOLN
1000.0000 ug | Freq: Once | INTRAMUSCULAR | Status: AC
Start: 2013-08-25 — End: 2013-08-25
  Administered 2013-08-25: 1000 ug via INTRAMUSCULAR

## 2013-08-29 ENCOUNTER — Other Ambulatory Visit: Payer: Self-pay | Admitting: Nurse Practitioner

## 2013-09-22 ENCOUNTER — Ambulatory Visit (INDEPENDENT_AMBULATORY_CARE_PROVIDER_SITE_OTHER): Payer: Medicare Other | Admitting: Family Medicine

## 2013-09-22 DIAGNOSIS — Z23 Encounter for immunization: Secondary | ICD-10-CM

## 2013-09-22 DIAGNOSIS — D519 Vitamin B12 deficiency anemia, unspecified: Secondary | ICD-10-CM

## 2013-09-22 DIAGNOSIS — D518 Other vitamin B12 deficiency anemias: Secondary | ICD-10-CM

## 2013-09-22 MED ORDER — CYANOCOBALAMIN 1000 MCG/ML IJ SOLN
1000.0000 ug | Freq: Once | INTRAMUSCULAR | Status: AC
Start: 2013-09-22 — End: 2013-09-22
  Administered 2013-09-22: 1000 ug via INTRAMUSCULAR

## 2013-10-20 ENCOUNTER — Ambulatory Visit (INDEPENDENT_AMBULATORY_CARE_PROVIDER_SITE_OTHER): Payer: Medicare Other | Admitting: Family Medicine

## 2013-10-20 DIAGNOSIS — D519 Vitamin B12 deficiency anemia, unspecified: Secondary | ICD-10-CM

## 2013-10-20 DIAGNOSIS — D518 Other vitamin B12 deficiency anemias: Secondary | ICD-10-CM

## 2013-10-20 MED ORDER — CYANOCOBALAMIN 1000 MCG/ML IJ SOLN
1000.0000 ug | Freq: Once | INTRAMUSCULAR | Status: AC
  Administered 2013-10-20: 1000 ug via INTRAMUSCULAR

## 2013-10-29 ENCOUNTER — Telehealth: Payer: Self-pay | Admitting: Internal Medicine

## 2013-10-29 NOTE — Telephone Encounter (Signed)
Patient Information:  Caller Name: Derra  Phone: [phone]  Patient: Kimberly Silva, Kimberly Silva  Gender: Female  DOB: 03/01/1926  Age: 79 Years  PCP: Berniece Andreas (Family Practice)  Office Follow Up:  Does the office need to follow up with this patient?: No  Instructions For The Office: N/A  RN Note:  Pt has intermittent L thigh pain that is like a toothache at times, others just a dull ache. Skin is clear. She is ambulatory, denies injury.  Symptoms  Reason For Call & Symptoms: L leg pain  Reviewed Health History In EMR: Yes  Reviewed Medications In EMR: Yes  Reviewed Allergies In EMR: Yes  Reviewed Surgeries / Procedures: Yes  Date of Onset of Symptoms: 10/27/2013  Treatments Tried: Tylenol  Treatments Tried Worked: No  Guideline(s) Used:  Leg Pain  Disposition Per Guideline:   Home Care  Reason For Disposition Reached:   Leg pain  Advice Given:  Reassurance - Leg Pain  Usually leg pain is not serious. You have told me that there is no redness, numbness, or swelling.  Causes of leg pain can include a strained muscle, a forgotten minor injury, and tendinitis.  Here is some care advice that should help.  Pain Medicines:  For pain relief, you can take either acetaminophen, ibuprofen, or naproxen.  Call Back If:  Moderate pain (e.g., limping) lasts more than 3 days  Mild pain lasts more than 7 days  Signs of infection occur (e.g., spreading redness, warmth, fever)  You become worse.  Patient Will Follow Care Advice:  YES

## 2013-11-01 ENCOUNTER — Encounter (HOSPITAL_COMMUNITY): Payer: Medicare Other

## 2013-11-01 ENCOUNTER — Encounter: Payer: Self-pay | Admitting: Internal Medicine

## 2013-11-01 ENCOUNTER — Ambulatory Visit (INDEPENDENT_AMBULATORY_CARE_PROVIDER_SITE_OTHER): Payer: Medicare Other | Admitting: Internal Medicine

## 2013-11-01 VITALS — BP 160/70 | HR 54 | Temp 97.8°F | Wt 127.0 lb

## 2013-11-01 DIAGNOSIS — M79605 Pain in left leg: Secondary | ICD-10-CM | POA: Insufficient documentation

## 2013-11-01 DIAGNOSIS — G20 Parkinson's disease: Secondary | ICD-10-CM

## 2013-11-01 DIAGNOSIS — I10 Essential (primary) hypertension: Secondary | ICD-10-CM

## 2013-11-01 DIAGNOSIS — M79609 Pain in unspecified limb: Secondary | ICD-10-CM

## 2013-11-01 MED ORDER — TRAMADOL HCL 50 MG PO TABS: 50.0000 mg | ORAL_TABLET | Freq: Three times a day (TID) | ORAL | Status: DC | PRN

## 2013-11-01 NOTE — Progress Notes (Signed)
Chief Complaint  Patient presents with  . Leg Pain    Left leg.  Started last Wednesday.    HPI: Patient comes in today for SDA for  new problem evaluation. Left thight and then lower "jumps around " NO injury no fever  But feels nauseating.   Worse at night laying down. sometimes when walking 1 tylenol every 4 hours  Seems like tooth ache pain ocass burning and no rashes  Sometimes lateral hip pain.   ROS: See pertinent positives and negatives per HPI. No hx blood clots  Selling  ? fi parkinson's could be related   Past Medical History  Diagnosis Date  . HYPERLIPIDEMIA 02/21/2009  . HYPOKALEMIA 08/17/2007  . ANEMIA, B12 DEFICIENCY 03/13/2009  . HYPERTENSION 05/14/2007  . Blood in stool 01/25/2008  . OSTEOARTHRITIS 05/14/2007  . OSTEOPENIA 05/14/2007    2007 hx of fosamax use   . TREMOR 07/08/2007  . FREQUENCY, URINARY 08/19/2008  . LUMBAR STRAIN 09/21/2007  . Gastric polyp     panendo  5 2008/05/01  . History of ankle fracture   . Parkinson's disease   . Adenomatous colon polyp     Family History  Problem Relation Age of Onset  . Heart attack Mother   . Coronary artery disease Mother   . Colon cancer Sister     x 2  . Breast cancer Sister     x 4  . Parkinsonism Brother   . Peripheral vascular disease      sibling  . Stroke Father   . Colon cancer Other     nephew  . Colon cancer Paternal Aunt     History   Social History  . Marital Status: Widowed    Spouse Name: N/A    Number of Children: 5  . Years of Education: N/A   Occupational History  .     Social History Main Topics  . Smoking status: Never Smoker   . Smokeless tobacco: Never Used  . Alcohol Use: No  . Drug Use: No  . Sexual Activity: None   Other Topics Concern  . None   Social History Narrative   Husband died [facility] in 01-May-2008 of leukemia   Widowed   Recently moved to   Retirement assisted living home.   [facility]   G7P5    Outpatient Encounter Prescriptions as of 11/01/2013   Medication Sig  . aspirin 81 MG tablet Take 81 mg by mouth daily.    . carbidopa-levodopa (SINEMET IR) 25-100 MG per tablet TAKE 1 TAB 30 MINS BEFORE BREAKFAST AND DINNER  . Cholecalciferol (VITAMIN D3) 1000 UNITS CAPS Take by mouth.  . Clobetasol Propionate 0.05 % lotion Apply topically daily.   . cyanocobalamin 1000 MCG/ML injection Inject into the muscle.    . fluticasone (FLONASE) 50 MCG/ACT nasal spray USE 2 SPRAYS IN EACH NOSTRIL EVERY DAY  . hydrocortisone (ANUSOL-HC) 25 MG suppository INSERT 1 SUPPOSITORY RECTALLY AT BEDTIME  . ketoconazole (NIZORAL) 2 % cream Apply topically 2 (two) times daily.  Marland Kitchen KLOR-CON M10 10 MEQ tablet TAKE 1 TABLET (10 MEQ TOTAL) BY MOUTH DAILY.  . Magnesium 250 MG TABS Take 1 each by mouth daily.  . pantoprazole (PROTONIX) 40 MG tablet TAKE 1 TABLET (40 MG TOTAL) BY MOUTH DAILY.  Bertram Gala Glycol-Propyl Glycol (SYSTANE) 0.4-0.3 % SOLN Apply to eye.   . polyethylene glycol (MIRALAX / GLYCOLAX) packet Take 17 g by mouth daily.  . propranolol ER (INDERAL LA) 120 MG  24 hr capsule TAKE ONE CAPSULE BY MOUTH EVERY DAY  . triamterene-hydrochlorothiazide (MAXZIDE-25) 37.5-25 MG per tablet TAKE 1 TABLET BY MOUTH EVERY DAY  . [DISCONTINUED] propranolol (INNOPRAN XL) 120 MG 24 hr capsule Take 120 mg by mouth at bedtime.  . traMADol (ULTRAM) 50 MG tablet Take 1 tablet (50 mg total) by mouth every 8 (eight) hours as needed for moderate pain (can take 1/2 pill).  . [DISCONTINUED] Docusate Calcium ([employer] STOOL SOFTENER PO) Take by mouth.  . [DISCONTINUED] ondansetron (ZOFRAN) 4 MG tablet Take 1 tablet (4 mg total) by mouth every 8 (eight) hours as needed for nausea.    EXAM:  BP 160/70  Pulse 54  Temp(Src) 97.8 F (36.6 C) (Oral)  Wt 127 lb (57.607 kg)  SpO2 98%  Body mass index is 24.01 kg/(m^2).  GENERAL: vitals reviewed and listed above, alert, oriented, appears well hydrated and in no acute distress minimal tremors    HEENT: atraumatic, conjunctiva  clear,  no obvious abnormalities on inspection of external nose and ears NECK: no obvious masses on inspection palpation  CV: HRRR, no clubbing cyanosis or  peripheral edema nl cap refill  Pulse present foot  Nl cap refill  MS: moves all extremities without noticeable focal  Abnormality has oa changes  Left leg no effusion  No chords  Some vv  No bony tenderness points to upper thigh and shin are  But not hurting to touch now. .  PSYCH: pleasant and cooperative, no obvious depression or anxiety  ASSESSMENT AND PLAN:  Discussed the following assessment and plan:  Left leg pain - Plan: Lower Extremity Venous Duplex Left Uncertain etiology worse at night not typical claudication. Could be mechanical orthopedic check venous Dopplers  asapcan continue taking Tylenol and add Ultram or tramadol 25-50 mg as needed every 8 hours CNS precautions discussed.  -Patient advised to return or notify health care team  if symptoms worsen or persist or new concerns arise.  Patient Instructions  Uncertain cause of you pain . Pulse seems ok .  Will get ultrasound to make sure no blood c;lots . If negative will plan on  Getting ortho to see you.   Can try ultram a mild narcotic medication in the meantime   Any can cause nausea and drowsiness but may help at night if needed   Can take with tylenol.   Neta Mends. Chelcea Zahn M.D.

## 2013-11-01 NOTE — Patient Instructions (Signed)
Uncertain cause of you pain . Pulse seems ok .  Will get ultrasound to make sure no blood c;lots . If negative will plan on  Getting ortho to see you.   Can try ultram a mild narcotic medication in the meantime   Any can cause nausea and drowsiness but may help at night if needed   Can take with tylenol.

## 2013-11-02 ENCOUNTER — Encounter: Payer: Self-pay | Admitting: Cardiology

## 2013-11-02 ENCOUNTER — Ambulatory Visit (HOSPITAL_COMMUNITY): Payer: Medicare Other | Attending: Internal Medicine

## 2013-11-02 DIAGNOSIS — I10 Essential (primary) hypertension: Secondary | ICD-10-CM | POA: Insufficient documentation

## 2013-11-02 DIAGNOSIS — M79605 Pain in left leg: Secondary | ICD-10-CM

## 2013-11-02 DIAGNOSIS — M79609 Pain in unspecified limb: Secondary | ICD-10-CM

## 2013-11-02 DIAGNOSIS — Z86718 Personal history of other venous thrombosis and embolism: Secondary | ICD-10-CM | POA: Insufficient documentation

## 2013-11-02 DIAGNOSIS — E785 Hyperlipidemia, unspecified: Secondary | ICD-10-CM | POA: Insufficient documentation

## 2013-11-05 ENCOUNTER — Other Ambulatory Visit: Payer: Self-pay | Admitting: Family Medicine

## 2013-11-05 ENCOUNTER — Telehealth: Payer: Self-pay | Admitting: Internal Medicine

## 2013-11-05 DIAGNOSIS — M79605 Pain in left leg: Secondary | ICD-10-CM

## 2013-11-05 NOTE — Telephone Encounter (Signed)
Pt is returning call to [facility] regarding results that she is anxiety waiting to get.

## 2013-11-05 NOTE — Telephone Encounter (Signed)
Patient notified of results by telephone.  Referral to ortho placed in the system per the pt due to continued pain.

## 2013-11-08 ENCOUNTER — Telehealth: Payer: Self-pay | Admitting: Internal Medicine

## 2013-11-08 NOTE — Telephone Encounter (Signed)
Please order walker if possible  As advised  Can refill pain med ultram if she needs refill in the interim.

## 2013-11-08 NOTE — Telephone Encounter (Signed)
Pt was able to get appt at ortho Thurs, dec 4. But in the meantime, pt would like an order for a walker. (to go to meals and get around from her apartment) Also, pt wants to know what to do should she run out of pain meds before then.

## 2013-11-10 NOTE — Telephone Encounter (Signed)
Patient notified to pick up at the front desk.  She will call back for refills of tramadol if needed.

## 2013-11-17 ENCOUNTER — Ambulatory Visit: Payer: Medicare Other | Admitting: Family Medicine

## 2013-11-24 ENCOUNTER — Ambulatory Visit (INDEPENDENT_AMBULATORY_CARE_PROVIDER_SITE_OTHER): Payer: Medicare Other | Admitting: Family Medicine

## 2013-11-24 ENCOUNTER — Other Ambulatory Visit: Payer: Self-pay | Admitting: Internal Medicine

## 2013-11-24 DIAGNOSIS — D519 Vitamin B12 deficiency anemia, unspecified: Secondary | ICD-10-CM

## 2013-11-24 DIAGNOSIS — D518 Other vitamin B12 deficiency anemias: Secondary | ICD-10-CM

## 2013-11-24 MED ORDER — CYANOCOBALAMIN 1000 MCG/ML IJ SOLN
1000.0000 ug | Freq: Once | INTRAMUSCULAR | Status: AC
Start: 2013-11-24 — End: 2013-11-24
  Administered 2013-11-24: 1000 ug via INTRAMUSCULAR

## 2013-12-20 ENCOUNTER — Other Ambulatory Visit: Payer: Self-pay

## 2013-12-20 DIAGNOSIS — Z1231 Encounter for screening mammogram for malignant neoplasm of breast: Secondary | ICD-10-CM

## 2013-12-22 ENCOUNTER — Ambulatory Visit (INDEPENDENT_AMBULATORY_CARE_PROVIDER_SITE_OTHER): Payer: Medicare Other | Admitting: Family Medicine

## 2013-12-22 DIAGNOSIS — D518 Other vitamin B12 deficiency anemias: Secondary | ICD-10-CM

## 2013-12-22 DIAGNOSIS — D519 Vitamin B12 deficiency anemia, unspecified: Secondary | ICD-10-CM

## 2013-12-22 MED ORDER — CYANOCOBALAMIN 1000 MCG/ML IJ SOLN
1000.0000 ug | Freq: Once | INTRAMUSCULAR | Status: AC
  Administered 2013-12-22: 1000 ug via INTRAMUSCULAR

## 2014-01-05 ENCOUNTER — Ambulatory Visit (INDEPENDENT_AMBULATORY_CARE_PROVIDER_SITE_OTHER): Payer: Medicare Other | Admitting: Internal Medicine

## 2014-01-05 ENCOUNTER — Encounter: Payer: Self-pay | Admitting: Internal Medicine

## 2014-01-05 VITALS — BP 130/70 | HR 76 | Temp 98.5°F | Ht 60.25 in | Wt 128.0 lb

## 2014-01-05 DIAGNOSIS — D518 Other vitamin B12 deficiency anemias: Secondary | ICD-10-CM

## 2014-01-05 DIAGNOSIS — G20C Parkinsonism, unspecified: Secondary | ICD-10-CM

## 2014-01-05 DIAGNOSIS — M79605 Pain in left leg: Secondary | ICD-10-CM

## 2014-01-05 DIAGNOSIS — Z0000 Encounter for general adult medical examination without abnormal findings: Secondary | ICD-10-CM

## 2014-01-05 DIAGNOSIS — M1990 Unspecified osteoarthritis, unspecified site: Secondary | ICD-10-CM

## 2014-01-05 DIAGNOSIS — Z23 Encounter for immunization: Secondary | ICD-10-CM

## 2014-01-05 DIAGNOSIS — M79609 Pain in unspecified limb: Secondary | ICD-10-CM

## 2014-01-05 DIAGNOSIS — I10 Essential (primary) hypertension: Secondary | ICD-10-CM

## 2014-01-05 DIAGNOSIS — R630 Anorexia: Secondary | ICD-10-CM

## 2014-01-05 DIAGNOSIS — Z0110 Encounter for examination of ears and hearing without abnormal findings: Secondary | ICD-10-CM

## 2014-01-05 DIAGNOSIS — G20 Parkinson's disease: Secondary | ICD-10-CM

## 2014-01-05 DIAGNOSIS — D519 Vitamin B12 deficiency anemia, unspecified: Secondary | ICD-10-CM

## 2014-01-05 LAB — TSH: TSH: 0.81 u[IU]/mL (ref 0.35–5.50)

## 2014-01-05 LAB — CBC WITH DIFFERENTIAL/PLATELET
BASOS ABS: 0 10*3/uL (ref 0.0–0.1)
Basophils Relative: 0.5 % (ref 0.0–3.0)
EOS PCT: 2 % (ref 0.0–5.0)
Eosinophils Absolute: 0.2 10*3/uL (ref 0.0–0.7)
HCT: 36.6 % (ref 36.0–46.0)
Hemoglobin: 12.1 g/dL (ref 12.0–15.0)
LYMPHS PCT: 17.2 % (ref 12.0–46.0)
Lymphs Abs: 1.4 10*3/uL (ref 0.7–4.0)
MCHC: 33.1 g/dL (ref 30.0–36.0)
MCV: 84 fl (ref 78.0–100.0)
MONOS PCT: 8.8 % (ref 3.0–12.0)
Monocytes Absolute: 0.7 10*3/uL (ref 0.1–1.0)
Neutro Abs: 5.9 10*3/uL (ref 1.4–7.7)
Neutrophils Relative %: 71.5 % (ref 43.0–77.0)
PLATELETS: 198 10*3/uL (ref 150.0–400.0)
RBC: 4.36 Mil/uL (ref 3.87–5.11)
RDW: 14.1 % (ref 11.5–14.6)
WBC: 8.2 10*3/uL (ref 4.5–10.5)

## 2014-01-05 LAB — BASIC METABOLIC PANEL
BUN: 20 mg/dL (ref 6–23)
CALCIUM: 9.2 mg/dL (ref 8.4–10.5)
CO2: 30 mEq/L (ref 19–32)
Chloride: 101 mEq/L (ref 96–112)
Creatinine, Ser: 1.1 mg/dL (ref 0.4–1.2)
GFR: 49.05 mL/min — AB (ref >60.00)
GLUCOSE: 81 mg/dL (ref 70–99)
Potassium: 3.7 mEq/L (ref 3.5–5.1)
SODIUM: 137 meq/L (ref 135–145)

## 2014-01-05 LAB — MAGNESIUM: MAGNESIUM: 1.9 mg/dL (ref 1.5–2.5)

## 2014-01-05 NOTE — Progress Notes (Signed)
Chief Complaint  Patient presents with  . Medicare Wellness    HPI: Patient comes in today for Preventive Medicare wellness visit . No major injuries, ed visits ,hospitalizations , new medications since last visit.  Tylenol for pain no falling using walker .   Had a pred taper.   wegiht loss  And ct scan  And to do  Flex scan colonscopy  Had polyps  Had  No cancer seen to use miralax for constipation  To see derm  Back itching  Has lots o sk an other lesions    To get mammogram no sx  ? If needs to continue?   2-3 May have breast cancer  Vs cysts in 80s .  Has sister 82 .  In nursing home  In [place] .   Taking K about every other day forgets  Mom heart failure 45  Father  Died 23  Cerebral henorrhag e.    Health Maintenance  Topic Date Due  . Influenza Vaccine  07/16/2014  . Tetanus/tdap  04/15/2021  . Colonoscopy  07/20/2023  . Pneumococcal Polysaccharide Vaccine Age 80 And Over  Completed  . Zostavax  Completed   Health Maintenance Review   Hearing:  Hearing aids  Still hard of hearing   Vision:  No limitations at present . Last eye check UTD yearly   Safety:  Has smoke detector and wears seat belts.  No firearms. No excess sun exposure. Sees dentist regularly.  Falls: uses walker no falling   Advance directive :  Reviewed    Memory: Felt to be good sometimes shrot term  , no concern from her or her family.  Depression: No anhedonia unusual crying or depressive symptoms  Nutrition: ;  Has battled nausea  adequate calcium and vitamin D. No swallowing chewing problems.eats better when out of facility and when on pred   Injury: no major injuries in the last six months.  Other healthcare providers:  Reviewed today .  Social:  Lives appartement institutional food No pets.   Preventive parameters: Reviewed   ADLS:   There are no problems or need for assistance  driving, feeding, obtaining food, dressing, toileting and bathing,  managing money using phone.  She is independent. Uses walker at home cane   EXERCISE/ HABITS  Per week  Physical therapy helped leg pain   No tobacco    etoh   ROS:  GEN/ HEENT: No fever, significant weight changes sweats headaches vision problems hearing changes, CV/ PULM; No chest pain shortness of breath cough, syncope,edema  change in exercise tolerance. GI /GU: No adominal pain, vomiting, change in bowel habits. No blood in the stool. No significant GU symptoms. SKIN/HEME: ,no acute skin rashes suspicious lesions or bleeding. No lymphadenopathy, nodules, masses.  NEURO/ PSYCH:  No neurologic signs such as weakness numbness. No depression anxiety. IMM/ Allergy: No unusual infections.  Allergy .   REST of 12 system review negative except as per HPI   Past Medical History  Diagnosis Date  . HYPERLIPIDEMIA 02/21/2009  . HYPOKALEMIA 08/17/2007  . ANEMIA, B12 DEFICIENCY 03/13/2009  . HYPERTENSION 05/14/2007  . Blood in stool 01/25/2008  . OSTEOARTHRITIS 05/14/2007  . OSTEOPENIA 05/14/2007    2007 hx of fosamax use   . TREMOR 07/08/2007  . FREQUENCY, URINARY 08/19/2008  . LUMBAR STRAIN 09/21/2007  . Gastric polyp     panendo  5 2009  . History of ankle fracture   . Parkinson's disease   . Adenomatous colon polyp  Family History  Problem Relation Age of Onset  . Heart attack Mother   . Coronary artery disease Mother   . Colon cancer Sister     x 2  . Breast cancer Sister     x 4  . Parkinsonism Brother   . Peripheral vascular disease      sibling  . Stroke Father   . Colon cancer Other     nephew  . Colon cancer Paternal Aunt     History   Social History  . Marital Status: Widowed    Spouse Name: N/A    Number of Children: 3  . Years of Education: N/A   Occupational History  .     Social History Main Topics  . Smoking status: Never Smoker   . Smokeless tobacco: Never Used  . Alcohol Use: No  . Drug Use: No  . Sexual Activity: None   Other Topics Concern  . None   Social History  Narrative   Husband died in [place] in 2008/04/18 of leukemia   Widowed   Recently moved to   Retirement assisted living home.   [facility]   G7P5    Outpatient Encounter Prescriptions as of 01/05/2014  Medication Sig  . aspirin 81 MG tablet Take 81 mg by mouth daily.    . carbidopa-levodopa (SINEMET IR) 25-100 MG per tablet TAKE 1 TAB 30 MINS BEFORE BREAKFAST AND DINNER  . Cholecalciferol (VITAMIN D3) 1000 UNITS CAPS Take by mouth.  . Clobetasol Propionate 0.05 % lotion Apply topically daily.   . cyanocobalamin 1000 MCG/ML injection Inject into the muscle.    . fluticasone (FLONASE) 50 MCG/ACT nasal spray USE 2 SPRAYS IN EACH NOSTRIL EVERY DAY  . ketoconazole (NIZORAL) 2 % cream Apply topically 2 (two) times daily.  Marland Kitchen KLOR-CON M10 10 MEQ tablet TAKE 1 TABLET (10 MEQ TOTAL) BY MOUTH DAILY.  . Magnesium 250 MG TABS Take 1 each by mouth daily.  . pantoprazole (PROTONIX) 40 MG tablet TAKE 1 TABLET (40 MG TOTAL) BY MOUTH DAILY.  [name] Glycol-Propyl Glycol (SYSTANE) 0.4-0.3 % SOLN Apply to eye.   . polyethylene glycol (MIRALAX / GLYCOLAX) packet Take 17 g by mouth daily.  . propranolol ER (INDERAL LA) 120 MG 24 hr capsule TAKE ONE CAPSULE BY MOUTH EVERY DAY  . triamterene-hydrochlorothiazide (MAXZIDE-25) 37.5-25 MG per tablet TAKE 1 TABLET BY MOUTH EVERY DAY  . hydrocortisone (ANUSOL-HC) 25 MG suppository INSERT 1 SUPPOSITORY RECTALLY AT BEDTIME  . traMADol (ULTRAM) 50 MG tablet Take 1 tablet (50 mg total) by mouth every 8 (eight) hours as needed for moderate pain (can take 1/2 pill).    EXAM:  BP 130/70  Pulse 76  Temp(Src) 98.5 F (36.9 C) (Oral)  Ht 5' 0.25" (1.53 m)  Wt 128 lb (58.06 kg)  BMI 24.80 kg/m2  SpO2 94%  Body mass index is 24.8 kg/(m^2).  Physical Exam: Vital signs reviewed EVO:JJKK is a well-developed well-nourished alert cooperative   who appears stated age in no acute distress. midl tremor hands pill rolling  Gait en mass independent  Has cane  HEENT:  normocephalic atraumatic , Eyes: PERRL EOM's full, conjunctiva clear, Nares: paten,t no deformity discharge or tenderness., Ears: no deformity EAC's aids  Mouth: clear OP, no lesions, edema.  Moist mucous membranes. Dentition in adequate repair. NECK: supple without masses, thyromegaly or bruits. CHEST/PULM:  Clear to auscultation and percussion breath sounds equal no wheeze , rales or rhonchi. No chest wall deformities or tenderness.  CV: PMI is nondisplaced, S1 S2 no gallops, murmurs, rubs. Peripheral pulses are full without delay.No JVD .  Breast: normal by inspection . No dimpling, discharge, masses, tenderness or discharge . ABDOMEN: Bowel sounds normal nontender  No guard or rebound, no hepato splenomegal no CVA tenderness.  No hernia. Extremtities:  No clubbing cyanosis or edema, no acute joint swelling or redness no focal atrophy NEURO:  Oriented x3, cranial nerves 3-12 appear to be intact, no obvious focal weakness,gait within tremor and  Park type tremor noted nl speech  SKIN: No acute rashes normal turgor, color, no bruising or petechiae.right hafinger nails thickened and deformed left is normal  PSYCH: Oriented, good eye contact, no obvious depression anxiety, cognition and judgment appear normal. LN: no cervical axillary  adenopathy No noted deficits in memory, attention, and speech.  Lab Results  Component Value Date   WBC 8.2 01/05/2014   HGB 12.1 01/05/2014   HCT 36.6 01/05/2014   PLT 198.0 01/05/2014   GLUCOSE 81 01/05/2014   CHOL 199 04/24/2012   TRIG 122.0 04/24/2012   HDL 69.30 04/24/2012   LDLDIRECT 101.1 04/16/2011   LDLCALC 105* 04/24/2012   ALT 10 04/24/2012   AST 17 04/24/2012   NA 137 01/05/2014   K 3.7 01/05/2014   CL 101 01/05/2014   CREATININE 1.1 01/05/2014   BUN 20 01/05/2014   CO2 30 01/05/2014   TSH 0.81 01/05/2014   Wt Readings from Last 3 Encounters:  01/05/14 128 lb (58.06 kg)  11/01/13 127 lb (57.607 kg)  07/16/13 124 lb (56.246 kg)    ASSESSMENT AND  PLAN:  Discussed the following assessment and plan:  Medicare annual wellness visit, subsequent - Plan: Basic metabolic panel, CBC with Differential, Magnesium, TSH  Anemia, B12 deficiency - does best on injections  continue - Plan: Basic metabolic panel, CBC with Differential, Magnesium, TSH  Unspecified essential hypertension - stable - Plan: Basic metabolic panel, CBC with Differential, Magnesium, TSH  Osteoarthrosis, unspecified whether generalized or localized, unspecified site - Plan: Basic metabolic panel, CBC with Differential, Magnesium, TSH  Parkinsonian tremor - about the same  on observation - Plan: Basic metabolic panel, CBC with Differential, Magnesium, TSH  Decreased appetite - ? if GP no weigh loss eats better out side of facility  - Plan: Basic metabolic panel, CBC with Differential, Magnesium, TSH  Need for prophylactic vaccination against Streptococcus pneumoniae (pneumococcus) - Plan: Pneumococcal conjugate vaccine 13-valent  Left leg pain - radicular  help with pred and PT using tylenol  per dr Drema Dallas  Generally doing well  Weigh tis stable  At this time and had evaluation for gi  malig with colon eval ct etc.   Patient Care Team: Burnis Medin, MD as PCP - General Lafayette Dragon, MD (Gastroenterology) Penni Bombard, MD (Neurology) Selinda Orion, MD as Attending Physician (Dermatology) Deliah Goody, MD (Ophthalmology)  Patient Instructions   followup with physical therapy in regard to your back and leg We'll check chemistry panel today and let you know results. Let Dr. Olevia Perches know that you have a problem with her appetite but I don't think there is other intervention at this time. If the problem becomes severe weight loss fever and pain contact us or Dr. Nichola Sizer office. You are receiving the Prevnar 13 pneumococcal vaccine today Continue B12 shots Are OV in 6 months or as needed.   Standley Brooking. Ahleah Simko M.D.    Pre visit review using our clinic review  tool, if applicable.  No additional management support is needed unless otherwise documented below in the visit note.

## 2014-01-05 NOTE — Patient Instructions (Addendum)
followup with physical therapy in regard to your back and leg We'll check chemistry panel today and let you know results. Let Dr. Olevia Perches know that you have a problem with her appetite but I don't think there is other intervention at this time. If the problem becomes severe weight loss fever and pain contact us or Dr. Nichola Sizer office. You are receiving the Prevnar 13 pneumococcal vaccine today Continue B12 shots Are OV in 6 months or as needed.

## 2014-01-06 ENCOUNTER — Ambulatory Visit: Payer: Medicare Other

## 2014-01-08 ENCOUNTER — Other Ambulatory Visit: Payer: Self-pay | Admitting: Internal Medicine

## 2014-01-11 ENCOUNTER — Other Ambulatory Visit: Payer: Self-pay | Admitting: Internal Medicine

## 2014-01-13 ENCOUNTER — Ambulatory Visit: Payer: Medicare Other

## 2014-01-18 ENCOUNTER — Ambulatory Visit: Payer: Self-pay | Admitting: Diagnostic Neuroimaging

## 2014-01-19 ENCOUNTER — Ambulatory Visit (INDEPENDENT_AMBULATORY_CARE_PROVIDER_SITE_OTHER): Payer: Medicare Other | Admitting: Family Medicine

## 2014-01-19 ENCOUNTER — Telehealth: Payer: Self-pay | Admitting: Internal Medicine

## 2014-01-19 DIAGNOSIS — D518 Other vitamin B12 deficiency anemias: Secondary | ICD-10-CM

## 2014-01-19 DIAGNOSIS — D519 Vitamin B12 deficiency anemia, unspecified: Secondary | ICD-10-CM

## 2014-01-19 MED ORDER — CYANOCOBALAMIN 1000 MCG/ML IJ SOLN
1000.0000 ug | Freq: Once | INTRAMUSCULAR | Status: AC
  Administered 2014-01-19: 1000 ug via INTRAMUSCULAR

## 2014-01-19 NOTE — Telephone Encounter (Signed)
Relevant patient education assigned to patient using Emmi. ° °

## 2014-01-26 ENCOUNTER — Other Ambulatory Visit: Payer: Self-pay | Admitting: Diagnostic Neuroimaging

## 2014-02-10 ENCOUNTER — Ambulatory Visit: Payer: Self-pay | Admitting: Diagnostic Neuroimaging

## 2014-02-12 ENCOUNTER — Other Ambulatory Visit: Payer: Self-pay | Admitting: Internal Medicine

## 2014-02-15 ENCOUNTER — Encounter: Payer: Self-pay | Admitting: *Deleted

## 2014-02-16 ENCOUNTER — Ambulatory Visit (INDEPENDENT_AMBULATORY_CARE_PROVIDER_SITE_OTHER): Payer: Medicare Other | Admitting: Family Medicine

## 2014-02-16 DIAGNOSIS — D518 Other vitamin B12 deficiency anemias: Secondary | ICD-10-CM

## 2014-02-16 DIAGNOSIS — D519 Vitamin B12 deficiency anemia, unspecified: Secondary | ICD-10-CM

## 2014-02-16 MED ORDER — CYANOCOBALAMIN 1000 MCG/ML IJ SOLN: 1000.0000 ug | Freq: Once | INTRAMUSCULAR | Status: DC

## 2014-02-17 ENCOUNTER — Encounter: Payer: Self-pay | Admitting: Diagnostic Neuroimaging

## 2014-02-17 ENCOUNTER — Ambulatory Visit (INDEPENDENT_AMBULATORY_CARE_PROVIDER_SITE_OTHER): Payer: Medicare Other | Admitting: Diagnostic Neuroimaging

## 2014-02-17 VITALS — BP 149/64 | HR 67 | Temp 98.3°F | Ht 60.0 in | Wt 127.0 lb

## 2014-02-17 DIAGNOSIS — G20 Parkinson's disease: Secondary | ICD-10-CM

## 2014-02-17 MED ORDER — CARBIDOPA-LEVODOPA 25-100 MG PO TABS: 1.0000 | ORAL_TABLET | Freq: Three times a day (TID) | ORAL | Status: DC

## 2014-02-17 NOTE — Progress Notes (Signed)
[facility]  PATIENT: Kimberly Silva DOB: 1926/02/19  REFERRING CLINICIAN:  HISTORY FROM: patient and daughter REASON FOR VISIT: follow up   HISTORICAL  CHIEF COMPLAINT:  Chief Complaint  Patient presents with  . Follow-up    tremor    HISTORY OF PRESENT ILLNESS:   UPDATE 02/17/14: Since last visit, overall stable. Notes good tremor control and coordination in the mornings. Some wearing off by 4:30pm. Takes carb/levo 25/100 1 tab BID (7:45am, 4:30pm). Uses a walker and cane. Having some left leg problems, eval'd by ortho. Had MRI of lumbar spine, with some arthritis, disc bulging and cyst. Recommended to have epidural steroid injections.  UPDATE 01/25/13: Tremor remains stable with carbodopa/levodopa twice daily before meals. She is exercising 3x weekly. She is back on Propranolol and her essential tremor improved.She has no new complaints.     UPDATE 06/24/12:Tremor is better with Ursula Alert Geradine Girt.She is curently taking BID.   Pt developed nausea on Requip and stopped the drug. She continues with exercise class 3 x week, she has concluded her PT, OT. Her essential tremor is worse since PCP took her off propranolol and placed her on Metoprolol and she claims B/P is about the same. See ROS  UPDATE 04/08/12: Tremor progressing. Some increased fatigue, when standing in line for food. Discussed med options.  UPDATE: 10/08/11. No change in tremor, mild at rest. Intermittent jaw tremor noted. Was exercising but has not in the last several weeks due to a bowel problem. She is due to see Dr. Olevia Perches tomorrow. Discussed other medications for tremor, pt states "I have trouble with aspirin". No recent falls, remains independent in all ADL's, continues to drive short distances.   UPDATE 07/10/11: Began Ropinirole after last visit with Dr. Leta Baptist, she stopped the medication after a few days due to severe nausea. Tremor is same.  She is reluctant to try other  PD meds  now.She  is exercising 3 x weekly. She walks other days.   UPDATE 02/19/11: Now on propranolol 120mg  daily, with mild subjective improvement in tremor.  Tremor mainly when she hlds arms at posture in certain ways, but also at rest.  No side effects, except for reports of vivid dreams.  Planning to move into [facility] after Apr 1.  No falls.  UPDATE 11/13/10: Doing about the same; now on propranolol 60mg  daily per PCP, without improvement in tremor.  We discussed sinemet, but she wants to try higher propranolol first.  PRIOR HPI (05/11/10): 81 year old right-handed female with history of hypertension and B12 deficiency presenting for evaluation of tremor.  For the past 15-20 years patient has noticed a fine tremor and her bilateral hands particularly in her thumbs. The tremor is worse when she is carrying objects or holding her hands out to the side. She has not noticed tremor at rest. In 2008 she began to develop a jaw and voice tremor. The tremor worsens when she is around people or is nervous. Tremor is reduced when she is relaxing. She has a brother who may have had Parkinson's disease before he died. She has a sister with multiple sclerosis but she did not have tremor. There are no other family members with tremor or similar symptoms. She denies walking or balance difficulties. She is otherwise doing well medically.   REVIEW OF SYSTEMS: Full 14 system review of systems performed and notable only for easy bruising easy bleeding tremors draping aching muscle difficulty walking runny nose decreased appetite.  ALLERGIES: No Known  Allergies  HOME MEDICATIONS: Outpatient Prescriptions Prior to Visit  Medication Sig Dispense Refill  . aspirin 81 MG tablet Take 81 mg by mouth daily.        . Cholecalciferol (VITAMIN D3) 1000 UNITS CAPS Take by mouth.      . Clobetasol Propionate 0.05 % lotion Apply topically daily.       . cyanocobalamin 1000 MCG/ML injection Inject into the muscle.        .  fluticasone (FLONASE) 50 MCG/ACT nasal spray USE 2 SPRAYS IN EACH NOSTRIL EVERY DAY  16 g  3  . ketoconazole (NIZORAL) 2 % cream Apply topically 2 (two) times daily as needed.       Marland Kitchen KLOR-CON M10 10 MEQ tablet TAKE 1 TABLET (10 MEQ TOTAL) BY MOUTH DAILY.  30 tablet  5  . Magnesium 250 MG TABS Take 1 each by mouth daily.      . pantoprazole (PROTONIX) 40 MG tablet TAKE 1 TABLET (40 MG TOTAL) BY MOUTH DAILY.  30 tablet  5  . Polyethyl Glycol-Propyl Glycol (SYSTANE) 0.4-0.3 % SOLN Apply to eye.       . polyethylene glycol (MIRALAX / GLYCOLAX) packet Take 17 g by mouth daily.      . propranolol ER (INDERAL LA) 120 MG 24 hr capsule TAKE ONE CAPSULE BY MOUTH EVERY DAY  30 capsule  11  . traMADol (ULTRAM) 50 MG tablet Take 1 tablet (50 mg total) by mouth every 8 (eight) hours as needed for moderate pain (can take 1/2 pill).  20 tablet  0  . triamterene-hydrochlorothiazide (MAXZIDE-25) 37.5-25 MG per tablet TAKE 1 TABLET BY MOUTH EVERY DAY  30 tablet  11  . carbidopa-levodopa (SINEMET IR) 25-100 MG per tablet TAKE 1 TAB 30 MINS BEFORE BREAKFAST AND DINNER  60 tablet  0  . hydrocortisone (ANUSOL-HC) 25 MG suppository INSERT 1 SUPPOSITORY RECTALLY AT BEDTIME  30 suppository  1   Facility-Administered Medications Prior to Visit  Medication Dose Route Frequency Provider Last Rate Last Dose  . cyanocobalamin injection 1,000 mcg  1,000 mcg Intramuscular Q30 days Burnis Medin, MD   1,000 mcg at 06/08/12 1553    PAST MEDICAL HISTORY: Past Medical History  Diagnosis Date  . HYPERLIPIDEMIA 02/21/2009  . HYPOKALEMIA 08/17/2007  . ANEMIA, B12 DEFICIENCY 03/13/2009  . HYPERTENSION 05/14/2007  . Blood in stool 01/25/2008  . OSTEOARTHRITIS 05/14/2007  . OSTEOPENIA 05/14/2007    2007 hx of fosamax use   . TREMOR 07/08/2007  . FREQUENCY, URINARY 08/19/2008  . LUMBAR STRAIN 09/21/2007  . Gastric polyp     panendo  5 2009  . History of ankle fracture   . Parkinson's disease   . Adenomatous colon polyp     PAST  SURGICAL HISTORY: Past Surgical History  Procedure Laterality Date  . Abdominal hysterectomy    . Fracture  2007    left ankle- Hx of fosamax use  . Dilation and curettage of uterus    . Cataract extraction, bilateral    . Colonoscopy N/A 07/19/2013    Procedure: COLONOSCOPY;  Surgeon: Lafayette Dragon, MD;  Location: [facility];  Service: Endoscopy;  Laterality: N/A;    FAMILY HISTORY: Family History  Problem Relation Age of Onset  . Heart attack Mother   . Coronary artery disease Mother   . Colon cancer Sister     x 2  . Breast cancer Sister     x 4  . Parkinsonism Brother   . Peripheral vascular  disease      sibling  . Stroke Father   . Colon cancer Other     nephew  . Colon cancer Paternal Aunt   . Breast cancer Sister   . Breast cancer Sister     SOCIAL HISTORY:  History   Social History  . Marital Status: Widowed    Spouse Name: N/A    Number of Children: 5  . Years of Education: BA   Occupational History  . Retired     Runner, broadcasting/film/video   Social History Main Topics  . Smoking status: Never Smoker   . Smokeless tobacco: Never Used  . Alcohol Use: No  . Drug Use: No  . Sexual Activity: Not on file   Other Topics Concern  . Not on file   Social History Narrative   [facility] 04[place]22-2008 of leukemia   Widowed   Recently moved to   [facility] assisted living home.   [facility]   G7P5   Caffeine Use: 1 soda daily     PHYSICAL EXAM  Filed Vitals:   02/17/14 1326  BP: 149/64  Pulse: 67  Temp: 98.3 F (36.8 C)  TempSrc: Oral  Height: 5' (1.524 m)  Weight: 127 lb (57.607 kg)    Not recorded    Body mass index is 24.8 kg/(m^2).  General: Patient is awake, alert and in no acute distress.  Well developed and groomed.  MASKED FACIES. Neck is supple. Cardiovascular: Heart is regular rate and rhythm with no murmurs.  Neurologic Exam  Mental Status: Awake, alert.  Language is fluent and comprehension intact. Cranial Nerves: Pupils are  equal and reactive to light.  Visual fields are full to confrontation.  Conjugate eye movements are full and symmetric.  Facial sensation and strength are symmetric.  Hearing is intact.  Palate elevated symmetrically and uvula is midline.  Shoulder shug is symmetric.  Tongue is midline. Neg Myerson's sign Motor: Normal bulk and tone. REST TREMOR (BUE). MILD POSTURAL AND INTENTION TREMOR (LUE > RUE).  JAW AND VOICE TREMOR.  COGWHEELING IN BUE WITH CONTRALATERAL REINFORCEMENT. BRADYKINESIA (L > R). Full strength in the upper and lower extremities.  No pronator drift.  Sensory: Intact and symmetric to light touch. Coordination: No ataxia or dysmetria on finger-nose or rapid alternating movement testing. Gait and Station: Narrow based smooth gait, normal turns. UNSTEADY TANDEM. No shuffling, no difficulty with turns. USING A CANE.  Reflexes: Deep tendon reflexes in the upper and lower extremity are present and symmetric.    DIAGNOSTIC DATA (LABS, IMAGING, TESTING) - I reviewed patient records, labs, notes, testing and imaging myself where available.  Lab Results  Component Value Date   WBC 8.2 01/05/2014   HGB 12.1 01/05/2014   HCT 36.6 01/05/2014   MCV 84.0 01/05/2014   PLT 198.0 01/05/2014      Component Value Date/Time   NA 137 01/05/2014 1152   K 3.7 01/05/2014 1152   CL 101 01/05/2014 1152   CO2 30 01/05/2014 1152   GLUCOSE 81 01/05/2014 1152   BUN 20 01/05/2014 1152   CREATININE 1.1 01/05/2014 1152   CALCIUM 9.2 01/05/2014 1152   PROT 6.9 04/24/2012 0947   ALBUMIN 3.9 04/24/2012 0947   AST 17 04/24/2012 0947   ALT 10 04/24/2012 0947   ALKPHOS 49 04/24/2012 0947   BILITOT 0.5 04/24/2012 0947   GFRNONAA 50.54 04/11/2010 0902   GFRAA 69 02/21/2009 1203   Lab Results  Component Value Date  CHOL 199 04/24/2012   HDL 69.30 04/24/2012   LDLCALC 105* 04/24/2012   LDLDIRECT 101.1 04/16/2011   TRIG 122.0 04/24/2012   CHOLHDL 3 04/24/2012   No results found for this basename: HGBA1C   Lab Results    Component Value Date   CVELFYBO17 510 04/24/2012   Lab Results  Component Value Date   TSH 0.81 01/05/2014     ASSESSMENT AND PLAN  80 y.o. right-handed female with hypertension and B12 deficiency, with bilateral upper extremity tremor since early 1990's. Now with jaw and voice tremor (since 2008).  Exam notable for mixed tremor (RESTING < POSTURAL), cogwheeling and bradykinesia and gait abnormality.    Dx: parkinson's disease + essential tremor  PLAN 1. Continue Carbodopa/levodopa 25/100mg  BID; try adding half-full tab in mid-day  Meds ordered this encounter  Medications  . carbidopa-levodopa (SINEMET IR) 25-100 MG per tablet    Sig: Take 1 tablet by mouth 3 (three) times daily.    Dispense:  270 tablet    Refill:  4    Return in about 1 year (around 02/18/2015) for with Charlott Holler or Rajohn Henery.    Penni Bombard, MD 01/20/8526, 7:82 PM Certified in Neurology, Neurophysiology and Neuroimaging  [facility] [address], [place], [place] [address] [phone]

## 2014-02-17 NOTE — Patient Instructions (Signed)
Try adding extra half or full tab of carbidopa/levodopa around lunch time.  Consider transitioning away from driving.

## 2014-02-22 ENCOUNTER — Ambulatory Visit: Payer: Medicare Other

## 2014-03-02 ENCOUNTER — Ambulatory Visit
Admission: RE | Admit: 2014-03-02 | Discharge: 2014-03-02 | Disposition: A | Discharge status: Home / Self Care | Payer: Medicare Other | Source: Ambulatory Visit

## 2014-03-02 DIAGNOSIS — Z1231 Encounter for screening mammogram for malignant neoplasm of breast: Secondary | ICD-10-CM

## 2014-03-16 ENCOUNTER — Ambulatory Visit: Payer: Medicare Other | Admitting: Family Medicine

## 2014-03-23 ENCOUNTER — Ambulatory Visit: Payer: Medicare Other | Admitting: Family Medicine

## 2014-03-24 ENCOUNTER — Ambulatory Visit (INDEPENDENT_AMBULATORY_CARE_PROVIDER_SITE_OTHER): Payer: Medicare Other | Admitting: Internal Medicine

## 2014-03-24 ENCOUNTER — Encounter: Payer: Self-pay | Admitting: Internal Medicine

## 2014-03-24 VITALS — BP 150/74 | Temp 98.4°F | Ht 60.25 in | Wt 123.0 lb

## 2014-03-24 DIAGNOSIS — E538 Deficiency of other specified B group vitamins: Secondary | ICD-10-CM

## 2014-03-24 DIAGNOSIS — D518 Other vitamin B12 deficiency anemias: Secondary | ICD-10-CM

## 2014-03-24 DIAGNOSIS — R110 Nausea: Secondary | ICD-10-CM

## 2014-03-24 DIAGNOSIS — I10 Essential (primary) hypertension: Secondary | ICD-10-CM

## 2014-03-24 DIAGNOSIS — R630 Anorexia: Secondary | ICD-10-CM

## 2014-03-24 DIAGNOSIS — Z79899 Other long term (current) drug therapy: Secondary | ICD-10-CM

## 2014-03-24 DIAGNOSIS — D519 Vitamin B12 deficiency anemia, unspecified: Secondary | ICD-10-CM

## 2014-03-24 LAB — BASIC METABOLIC PANEL
BUN: 15 mg/dL (ref 6–23)
CO2: 31 mEq/L (ref 19–32)
Calcium: 9.6 mg/dL (ref 8.4–10.5)
Chloride: 97 mEq/L (ref 96–112)
Creatinine, Ser: 0.9 mg/dL (ref 0.4–1.2)
GFR: 60.76 mL/min (ref >60.00)
Glucose, Bld: 87 mg/dL (ref 70–99)
POTASSIUM: 3.2 meq/L — AB (ref 3.5–5.1)
SODIUM: 136 meq/L (ref 135–145)

## 2014-03-24 LAB — CBC WITH DIFFERENTIAL/PLATELET
BASOS PCT: 0.5 % (ref 0.0–3.0)
Basophils Absolute: 0 10*3/uL (ref 0.0–0.1)
EOS PCT: 0.7 % (ref 0.0–5.0)
Eosinophils Absolute: 0.1 10*3/uL (ref 0.0–0.7)
HEMATOCRIT: 40.2 % (ref 36.0–46.0)
HEMOGLOBIN: 13.4 g/dL (ref 12.0–15.0)
LYMPHS ABS: 1.1 10*3/uL (ref 0.7–4.0)
Lymphocytes Relative: 12.6 % (ref 12.0–46.0)
MCHC: 33.2 g/dL (ref 30.0–36.0)
MCV: 86.1 fl (ref 78.0–100.0)
MONOS PCT: 7.2 % (ref 3.0–12.0)
Monocytes Absolute: 0.6 10*3/uL (ref 0.1–1.0)
Neutro Abs: 7 10*3/uL (ref 1.4–7.7)
Neutrophils Relative %: 79 % — ABNORMAL HIGH (ref 43.0–77.0)
PLATELETS: 257 10*3/uL (ref 150.0–400.0)
RBC: 4.67 Mil/uL (ref 3.87–5.11)
RDW: 15 % — ABNORMAL HIGH (ref 11.5–14.6)
WBC: 8.8 10*3/uL (ref 4.5–10.5)

## 2014-03-24 LAB — HEPATIC FUNCTION PANEL
ALT: 3 U/L (ref 0–35)
AST: 12 U/L (ref 0–37)
Albumin: 3.9 g/dL (ref 3.5–5.2)
Alkaline Phosphatase: 41 U/L (ref 39–117)
BILIRUBIN TOTAL: 0.7 mg/dL (ref 0.3–1.2)
Bilirubin, Direct: 0 mg/dL (ref 0.0–0.3)
Total Protein: 6.9 g/dL (ref 6.0–8.3)

## 2014-03-24 LAB — MAGNESIUM: MAGNESIUM: 1.6 mg/dL (ref 1.5–2.5)

## 2014-03-24 MED ORDER — CYANOCOBALAMIN 1000 MCG/ML IJ SOLN
1000.0000 ug | Freq: Once | INTRAMUSCULAR | Status: AC
  Administered 2014-03-24: 1000 ug via INTRAMUSCULAR

## 2014-03-24 NOTE — Progress Notes (Signed)
Chief Complaint  Patient presents with  . Nausea    Ongoing for for one week.  Comes and goes.    HPI: Patient comes in today for or  new problem evaluation. With daughter today who drove her   Onset  After began lamisil rx from derm for nails  Had nl blood test pre initiation    Nausea  adn anorexia but no active vomitint    Stoppedmed for a few days but  not better  Only a week. Of sx no fever  Diarrhea  Light headed.feeling no vertigo  m better has had  parkinsons med slightly increased recently  ( doing well  ) Stomach feels hungry and cant. Eat.  Had dry heaves.  No sig HB  No pain med at this time. Injection march 17th . Helped back a leg a lot  More anxious this time of year. Wonders if that could cause it  gto car sick coming home from a party.   Not first  Can get car sick a lot   Potassium every other day causein g.  ? Sx   bp usually 130 range  And ok ? If should go back to metoprolol cause the tremors are better on sinemet.  ROS: See pertinent positives and negatives per HPI. Has gall stones?   No sx per se  Past Medical History  Diagnosis Date  . HYPERLIPIDEMIA 02/21/2009  . HYPOKALEMIA 08/17/2007  . ANEMIA, B12 DEFICIENCY 03/13/2009  . HYPERTENSION 05/14/2007  . Blood in stool 01/25/2008  . OSTEOARTHRITIS 05/14/2007  . OSTEOPENIA 05/14/2007    04/04/06 hx of fosamax use   . TREMOR 07/08/2007  . FREQUENCY, URINARY 08/19/2008  . LUMBAR STRAIN 09/21/2007  . Gastric polyp     panendo  5 04-Apr-2008  . History of ankle fracture   . Parkinson's disease   . Adenomatous colon polyp     Family History  Problem Relation Age of Onset  . Heart attack Mother   . Coronary artery disease Mother   . Colon cancer Sister     x 2  . Breast cancer Sister     x 4  . Parkinsonism Brother   . Peripheral vascular disease      sibling  . Stroke Father   . Colon cancer Other     nephew  . Colon cancer Paternal Aunt   . Breast cancer Sister   . Breast cancer Sister     History   Social  History  . Marital Status: Widowed    Spouse Name: N/A    Number of Children: 5  . Years of Education: BA   Occupational History  . Retired     Pharmacist, hospital   Social History Main Topics  . Smoking status: Never Smoker   . Smokeless tobacco: Never Used  . Alcohol Use: No  . Drug Use: No  . Sexual Activity: None   Other Topics Concern  . None   Social History Narrative   Husband died in [place] in 04/04/2008 of leukemia   Widowed   Recently moved to   Retirement assisted living home.   [facility]   G7P5   Caffeine Use: 1 soda daily    Outpatient Encounter Prescriptions as of 03/24/2014  Medication Sig  . aspirin 81 MG tablet Take 81 mg by mouth daily.    . carbidopa-levodopa (SINEMET IR) 25-100 MG per tablet Take 1 tablet by mouth 3 (three) times daily.  . Cholecalciferol (VITAMIN D3)  1000 UNITS CAPS Take by mouth.  . Clobetasol Propionate 0.05 % lotion Apply topically daily.   . cyanocobalamin 1000 MCG/ML injection Inject into the muscle.    . fluticasone (FLONASE) 50 MCG/ACT nasal spray USE 2 SPRAYS IN EACH NOSTRIL EVERY DAY  . KLOR-CON M10 10 MEQ tablet TAKE 1 TABLET (10 MEQ TOTAL) BY MOUTH DAILY.  . Magnesium 250 MG TABS Take 1 each by mouth daily.  . pantoprazole (PROTONIX) 40 MG tablet TAKE 1 TABLET (40 MG TOTAL) BY MOUTH DAILY.  [name] Glycol-Propyl Glycol (SYSTANE) 0.4-0.3 % SOLN Apply to eye.   . polyethylene glycol (MIRALAX / GLYCOLAX) packet Take 17 g by mouth daily.  . propranolol ER (INDERAL LA) 120 MG 24 hr capsule TAKE ONE CAPSULE BY MOUTH EVERY DAY  . traMADol (ULTRAM) 50 MG tablet Take 1 tablet (50 mg total) by mouth every 8 (eight) hours as needed for moderate pain (can take 1/2 pill).  . triamterene-hydrochlorothiazide (MAXZIDE-25) 37.5-25 MG per tablet TAKE 1 TABLET BY MOUTH EVERY DAY  . [DISCONTINUED] ketoconazole (NIZORAL) 2 % cream Apply topically 2 (two) times daily as needed.     EXAM:  BP 150/74  Temp(Src) 98.4 F (36.9 C) (Oral)  Ht 5'  0.25" (1.53 m)  Wt 123 lb (55.792 kg)  BMI 23.83 kg/m2  Body mass index is 23.83 kg/(m^2).  GENERAL: vitals reviewed and listed above, alert, oriented, appears well hydrated and in no acute distress HEENT: atraumatic, conjunctiva  clear, no obvious abnormalities on inspection of external nose and ears NECK: no obvious masses on inspection palpation  LUNGS: clear to auscultation bilaterally, no wheezes, rales or rhonchi, good air movement CV: HRRR, no clubbing cyanosis or  peripheral edema nl cap refill  MS: moves all extremities without noticeable focal  Abnormality oa changes  Abdomen:  Sof,t  Slightly decrease bowel sounds without hepatosplenomegaly, no guarding rebound or masses no CVA tenderness  PSYCH: pleasant and cooperative, no obvious depression or anxiety  ASSESSMENT AND PLAN:  Discussed the following assessment and plan:  Nausea - poss med effect sounds like motility issue hx of same but not suddenlabs may need to see dr Olevia Perches  - Plan: Basic metabolic panel, CBC with Differential, Hepatic function panel, Magnesium  Anemia, B12 deficiency - can get inj today - Plan: Basic metabolic panel, CBC with Differential, Hepatic function panel, Magnesium  Decreased appetite - Plan: Basic metabolic panel, CBC with Differential, Hepatic function panel, Magnesium  Unspecified essential hypertension - Plan: Basic metabolic panel, CBC with Differential, Hepatic function panel, Magnesium  Medication management - wouldnt change hte inderal at this time could consider carvelilol  or meto if needed review other drug groupsif needed - Plan: Basic metabolic panel, CBC with Differential, Hepatic function panel, Magnesium  Vitamin B12 deficiency - Plan: cyanocobalamin ((VITAMIN B-12)) injection 1,000 mcg  -Patient advised to return or notify health care team  if symptoms worsen ,persist or new concerns arise.  Patient Instructions  Will notify you  of labs when available. Stay off of the   lamisil for now . Incase related . If not getting better  We should have dr Olevia Perches see you . Sounds like it could be a motility problem and /or medicatoin side effect . Less likely the gall stones. Let us know if not better in another week or so then coinact [place]  Or appt with dr Olevia Perches .       Standley Brooking. Tattiana Fakhouri M.D.  Pre visit review using our clinic review tool,  if applicable. No additional management support is needed unless otherwise documented below in the visit note.  Lab Results  Component Value Date   WBC 8.8 03/24/2014   HGB 13.4 03/24/2014   HCT 40.2 03/24/2014   PLT 257.0 03/24/2014   GLUCOSE 87 03/24/2014   CHOL 199 04/24/2012   TRIG 122.0 04/24/2012   HDL 69.30 04/24/2012   LDLDIRECT 101.1 04/16/2011   LDLCALC 105* 04/24/2012   ALT 3 03/24/2014   AST 12 03/24/2014   NA 136 03/24/2014   K 3.2* 03/24/2014   CL 97 03/24/2014   CREATININE 0.9 03/24/2014   BUN 15 03/24/2014   CO2 31 03/24/2014   TSH 0.81 01/05/2014

## 2014-03-24 NOTE — Patient Instructions (Addendum)
Will notify you  of labs when available. Stay off of the  lamisil for now . Incase related . If not getting better  We should have dr Olevia Perches see you . Sounds like it could be a motility problem and /or medicatoin side effect . Less likely the gall stones. Let us know if not better in another week or so then coinact [place]  Or appt with dr Olevia Perches .

## 2014-03-25 ENCOUNTER — Other Ambulatory Visit: Payer: Self-pay | Admitting: Internal Medicine

## 2014-03-26 DIAGNOSIS — R110 Nausea: Secondary | ICD-10-CM | POA: Insufficient documentation

## 2014-03-28 ENCOUNTER — Telehealth: Payer: Self-pay | Admitting: Internal Medicine

## 2014-03-28 NOTE — Telephone Encounter (Signed)
I spoke to the pt.  She would like Dr. Regis Bill to know that she continues to have nausea and some dry heaves.  She states she feels very anxious.  She informed me that while here at her last visit, she spoke to Dr. Regis Bill about starting an anxiety medication.  She would like to try this before a referral to Dr. Olevia Perches.  She would also like to know how often she should take her b12 injections.  Patient notified that it may be the next business day before this is addressed.

## 2014-03-28 NOTE — Telephone Encounter (Signed)
Pt req call back about blood test results

## 2014-03-29 NOTE — Telephone Encounter (Signed)
Please see my note about lab tests and low potassium   Anxiety shouldn't really cause dry heaves.   Can try very short term lorazepam ( but has risk of sedation)  Lorazepam 0.5 mg  disp  20 take 1 po bid as needed for anxiety  No refill  Caution .  Plan foloow up  Or referral if continuing   We can consdier adding a controller med for anxiety  If continuing .  b12 every 4 weeks  Can do 3 weeks if does better with this .

## 2014-03-29 NOTE — Telephone Encounter (Signed)
Pt states she would like to see Dr Maurene Capes as soon as we can get referral.pt suggest Thurs.  Pt states she needs something!! Pt would like a CB as soon as you can. Pt still feels nauseous, and its worse! Pt states today when she wiped there was blood on the tissue.

## 2014-03-30 MED ORDER — LORAZEPAM 0.5 MG PO TABS: 0.5000 mg | ORAL_TABLET | Freq: Two times a day (BID) | ORAL | Status: DC | PRN

## 2014-03-30 NOTE — Telephone Encounter (Signed)
Spoke to the pt.  She will try lorazepam for a short period and let me know how that works.  If not working than will refer to GI. Patient notified to have B12 injection every 4 weeks.  May try every 3 weeks if needed.

## 2014-04-11 ENCOUNTER — Other Ambulatory Visit (INDEPENDENT_AMBULATORY_CARE_PROVIDER_SITE_OTHER): Payer: Medicare Other

## 2014-04-11 ENCOUNTER — Telehealth: Payer: Self-pay | Admitting: Internal Medicine

## 2014-04-11 DIAGNOSIS — I10 Essential (primary) hypertension: Secondary | ICD-10-CM

## 2014-04-11 LAB — BASIC METABOLIC PANEL
BUN: 14 mg/dL (ref 6–23)
CHLORIDE: 99 meq/L (ref 96–112)
CO2: 29 mEq/L (ref 19–32)
Calcium: 9.3 mg/dL (ref 8.4–10.5)
Creatinine, Ser: 1 mg/dL (ref 0.4–1.2)
GFR: 57.19 mL/min — AB (ref >60.00)
Glucose, Bld: 75 mg/dL (ref 70–99)
Potassium: 3.2 mEq/L — ABNORMAL LOW (ref 3.5–5.1)
SODIUM: 137 meq/L (ref 135–145)

## 2014-04-11 NOTE — Telephone Encounter (Signed)
Pt calling to report she will run out of LORazepam (ATIVAN) 0.5 MG tablet on Thursday and wants to know if PCP suggest she continue taking this med.  Pt reports med has helped but she does not want to become depended on it.  Please advise.

## 2014-04-12 NOTE — Telephone Encounter (Signed)
Kimberly Silva please call her   What symptoms seems better nausea ? Dry heaves ?  We may begin her on an antianxiety controller medication such as zoloft but they can also cause nausea at the onset   Plan  Refill lorazepam x 1 disp 30  Plan rov  Before runs out  Discuss medication  Also  her potassium is still low  We need to increase to  2 -10 meq per day   Total ( 20 meq) ?  Plan recheck bmp  After 2-3 weeks

## 2014-04-14 ENCOUNTER — Telehealth: Payer: Self-pay | Admitting: Internal Medicine

## 2014-04-14 DIAGNOSIS — E876 Hypokalemia: Secondary | ICD-10-CM

## 2014-04-14 MED ORDER — LORAZEPAM 0.5 MG PO TABS: 0.5000 mg | ORAL_TABLET | Freq: Two times a day (BID) | ORAL | Status: DC | PRN

## 2014-04-14 NOTE — Telephone Encounter (Signed)
Pt is needing order put in for her labs per dr. Regis Bill. Pt has appt scheduled for her 49mo/fu, however dr. Regis Bill requested labs a week prior.

## 2014-04-14 NOTE — Telephone Encounter (Signed)
Spoke to the pt.  She stated that her nausea and dry heaves are much better since starting the medication.  Sent in a refill of her lorazepam.  Advised that she call and make an appt in 30 days and she will need a lab appt .  She has to check with her ride and will then call back.

## 2014-04-14 NOTE — Telephone Encounter (Signed)
Error/gd °

## 2014-04-15 NOTE — Addendum Note (Signed)
Addended by: Miles Costain T on: 04/15/2014 12:36 PM   Modules accepted: Orders

## 2014-04-15 NOTE — Telephone Encounter (Signed)
Order for BMP placed in the system.

## 2014-04-18 NOTE — Telephone Encounter (Signed)
Error/gd °

## 2014-04-25 ENCOUNTER — Ambulatory Visit (INDEPENDENT_AMBULATORY_CARE_PROVIDER_SITE_OTHER): Payer: Medicare Other | Admitting: Family Medicine

## 2014-04-25 DIAGNOSIS — D518 Other vitamin B12 deficiency anemias: Secondary | ICD-10-CM

## 2014-04-25 DIAGNOSIS — D519 Vitamin B12 deficiency anemia, unspecified: Secondary | ICD-10-CM

## 2014-04-25 MED ORDER — CYANOCOBALAMIN 1000 MCG/ML IJ SOLN
1000.0000 ug | Freq: Once | INTRAMUSCULAR | Status: AC
  Administered 2014-04-25: 1000 ug via INTRAMUSCULAR

## 2014-05-06 ENCOUNTER — Telehealth: Payer: Self-pay | Admitting: Internal Medicine

## 2014-05-06 NOTE — Telephone Encounter (Signed)
[id number] - [place], [place] - [address] is requesting re-fills on the following: potassium chloride (KLOR-CON M10) 10 MEQ tablet triamterene-hydrochlorothiazide (MAXZIDE-25) 37.5-25 MG per tablet propranolol ER (INDERAL LA) 120 MG 24 hr capsule pantoprazole (PROTONIX) 40 MG tablet carbidopa-levodopa (SINEMET IR) 25-100 MG per tablet

## 2014-05-10 MED ORDER — POTASSIUM CHLORIDE CRYS ER 10 MEQ PO TBCR: EXTENDED_RELEASE_TABLET | ORAL | Status: DC

## 2014-05-10 MED ORDER — PANTOPRAZOLE SODIUM 40 MG PO TBEC: DELAYED_RELEASE_TABLET | ORAL | Status: DC

## 2014-05-13 ENCOUNTER — Encounter: Payer: Self-pay | Admitting: Internal Medicine

## 2014-05-13 ENCOUNTER — Ambulatory Visit (INDEPENDENT_AMBULATORY_CARE_PROVIDER_SITE_OTHER): Payer: Medicare Other | Admitting: Internal Medicine

## 2014-05-13 VITALS — BP 150/60 | Temp 98.0°F | Ht 60.25 in | Wt 128.0 lb

## 2014-05-13 DIAGNOSIS — I10 Essential (primary) hypertension: Secondary | ICD-10-CM

## 2014-05-13 DIAGNOSIS — F4322 Adjustment disorder with anxiety: Secondary | ICD-10-CM

## 2014-05-13 DIAGNOSIS — G20A1 Parkinson's disease without dyskinesia, without mention of fluctuations: Secondary | ICD-10-CM

## 2014-05-13 DIAGNOSIS — R110 Nausea: Secondary | ICD-10-CM

## 2014-05-13 DIAGNOSIS — G20 Parkinson's disease: Secondary | ICD-10-CM

## 2014-05-13 DIAGNOSIS — E876 Hypokalemia: Secondary | ICD-10-CM

## 2014-05-13 LAB — BASIC METABOLIC PANEL
BUN: 16 mg/dL (ref 6–23)
CALCIUM: 9.6 mg/dL (ref 8.4–10.5)
CO2: 31 mEq/L (ref 19–32)
Chloride: 98 mEq/L (ref 96–112)
Creatinine, Ser: 1 mg/dL (ref 0.4–1.2)
GFR: 53.39 mL/min — AB (ref >60.00)
Glucose, Bld: 83 mg/dL (ref 70–99)
Potassium: 3.9 mEq/L (ref 3.5–5.1)
SODIUM: 135 meq/L (ref 135–145)

## 2014-05-13 LAB — MAGNESIUM: MAGNESIUM: 1.7 mg/dL (ref 1.5–2.5)

## 2014-05-13 MED ORDER — LORAZEPAM 0.5 MG PO TABS: 0.5000 mg | ORAL_TABLET | Freq: Two times a day (BID) | ORAL | Status: DC | PRN

## 2014-05-13 NOTE — Patient Instructions (Signed)
Can take medications on stress day try off on low stress days to limit use. Call for refill if needed Consider controller medication Lab today report results Keep appointment in July or change it to a more convenient time.

## 2014-05-13 NOTE — Progress Notes (Signed)
Chief Complaint  Patient presents with  . Follow-up    nausea and anxiety    HPI: FU of nausea  Anxiety  here with daughter who drove   doing better  Gaining weight . Taking Ativan once a day. In am .    Gets anxious  Since her sister died. And having to deal with the state issues and will. Good family support no significant side effect doesn't really want to be dependent on it but has taken it daily since last visit  She's been able to gain some weight back.  Is back on potassium and doesn't seem to be having a problem with it. ROS: See pertinent positives and negatives per HPI. No new symptoms chest pain shortness of breath falling.  Past Medical History  Diagnosis Date  . HYPERLIPIDEMIA 02/21/2009  . HYPOKALEMIA 08/17/2007  . ANEMIA, B12 DEFICIENCY 03/13/2009  . HYPERTENSION 05/14/2007  . Blood in stool 01/25/2008  . OSTEOARTHRITIS 05/14/2007  . OSTEOPENIA 05/14/2007    03/28/2006 hx of fosamax use   . TREMOR 07/08/2007  . FREQUENCY, URINARY 08/19/2008  . LUMBAR STRAIN 09/21/2007  . Gastric polyp     panendo  5 2008/03/28  . History of ankle fracture   . Parkinson's disease   . Adenomatous colon polyp     Family History  Problem Relation Age of Onset  . Heart attack Mother   . Coronary artery disease Mother   . Colon cancer Sister     x 2  . Breast cancer Sister     x 4  . Parkinsonism Brother   . Peripheral vascular disease      sibling  . Stroke Father   . Colon cancer Other     nephew  . Colon cancer Paternal Aunt   . Breast cancer Sister   . Breast cancer Sister     History   Social History  . Marital Status: Widowed    Spouse Name: N/A    Number of Children: 63  . Years of Education: BA   Occupational History  . Retired     Pharmacist, hospital   Social History Main Topics  . Smoking status: Never Smoker   . Smokeless tobacco: Never Used  . Alcohol Use: No  . Drug Use: No  . Sexual Activity: None   Other Topics Concern  . None   Social History Narrative   Husband  died in [place] in March 28, 2008 of leukemia   Widowed   Recently moved to   Retirement assisted living home.   [facility]   G7P5   Caffeine Use: 1 soda daily    Outpatient Encounter Prescriptions as of 05/13/2014  Medication Sig  . aspirin 81 MG tablet Take 81 mg by mouth daily.    . carbidopa-levodopa (SINEMET IR) 25-100 MG per tablet Take 1 tablet by mouth 3 (three) times daily.  . Cholecalciferol (VITAMIN D3) 1000 UNITS CAPS Take by mouth.  . Clobetasol Propionate 0.05 % lotion Apply topically daily.   . cyanocobalamin 1000 MCG/ML injection Inject into the muscle.    . fluticasone (FLONASE) 50 MCG/ACT nasal spray USE 2 SPRAYS IN EACH NOSTRIL ONCE A DAY  . LORazepam (ATIVAN) 0.5 MG tablet Take 1 tablet (0.5 mg total) by mouth 2 (two) times daily as needed for anxiety.  . Magnesium 250 MG TABS Take 1 each by mouth daily.  . pantoprazole (PROTONIX) 40 MG tablet TAKE 1 TABLET (40 MG TOTAL) BY MOUTH DAILY.  [name] Glycol-Propyl  Glycol (SYSTANE) 0.4-0.3 % SOLN Apply to eye.   . polyethylene glycol (MIRALAX / GLYCOLAX) packet Take 17 g by mouth daily.  . potassium chloride (KLOR-CON M10) 10 MEQ tablet TAKE 2 TABLETS  (20 MEQ TOTAL) BY MOUTH DAILY.  Marland Kitchen propranolol ER (INDERAL LA) 120 MG 24 hr capsule TAKE ONE CAPSULE BY MOUTH EVERY DAY  . triamterene-hydrochlorothiazide (MAXZIDE-25) 37.5-25 MG per tablet TAKE 1 TABLET BY MOUTH EVERY DAY  . [DISCONTINUED] LORazepam (ATIVAN) 0.5 MG tablet Take 1 tablet (0.5 mg total) by mouth 2 (two) times daily as needed for anxiety.  . [DISCONTINUED] traMADol (ULTRAM) 50 MG tablet Take 1 tablet (50 mg total) by mouth every 8 (eight) hours as needed for moderate pain (can take 1/2 pill).    EXAM:  BP 150/60  Temp(Src) 98 F (36.7 C) (Oral)  Ht 5' 0.25" (1.53 m)  Wt 128 lb (58.06 kg)  BMI 24.80 kg/m2  Body mass index is 24.8 kg/(m^2).  GENERAL: vitals reviewed and listed above, alert, oriented, appears well hydrated and in no acute distress  well-groomed minimal tremor walks with cane independent ambulation HPSYCH: pleasant and cooperative,  Wt Readings from Last 3 Encounters:  05/13/14 128 lb (58.06 kg)  03/24/14 123 lb (55.792 kg)  02/17/14 127 lb (57.607 kg)    ASSESSMENT AND PLAN:  Discussed the following assessment and plan:  Nausea - Much improved on low dose of Ativan 1 think we need further workup at this time - Plan: Basic metabolic panel, Magnesium  Hypopotassemia - Back on potassium recheck labs today - Plan: Basic metabolic panel, Magnesium  Adjustment disorder with anxious mood - Slightly reactive but some baseline discussed risk benefit of Ativan as needed versus adding a controller medicine.  Unspecified essential hypertension - Plan: Basic metabolic panel, Magnesium  Parkinsonian tremor Discussion about risk benefit of given medicine;she's hesitant to go on a controller medicine such as Zoloft that could cause nausea I agreed that every day medicine may be more of her risk and help over the long run.   We can try having her use the medication on days that are low stress and see how she does and tolerates it.  Since she knows medication can help her feel better she may have decreased need/use for the medication.  However if continuing need long-term consider counseling and controller medicine Keep appointment about 2 months from now -Patient advised to return or notify health care team  if symptoms worsen ,persist or new concerns arise.  Patient Instructions  Can take medications on stress day try off on low stress days to limit use. Call for refill if needed Consider controller medication Lab today report results Keep appointment in July or change it to a more convenient time.     Standley Brooking. Panosh M.D.  Pre visit review using our clinic review tool, if applicable. No additional management support is needed unless otherwise documented below in the visit note.

## 2014-05-13 NOTE — Telephone Encounter (Signed)
Potassium and Protonix filled.  According to Epic, other medications have already been filled.

## 2014-05-16 ENCOUNTER — Encounter: Payer: Self-pay | Admitting: Family Medicine

## 2014-05-23 ENCOUNTER — Ambulatory Visit (INDEPENDENT_AMBULATORY_CARE_PROVIDER_SITE_OTHER): Payer: Medicare Other | Admitting: Family Medicine

## 2014-05-23 DIAGNOSIS — D519 Vitamin B12 deficiency anemia, unspecified: Secondary | ICD-10-CM

## 2014-05-23 DIAGNOSIS — D518 Other vitamin B12 deficiency anemias: Secondary | ICD-10-CM

## 2014-05-23 MED ORDER — CYANOCOBALAMIN 1000 MCG/ML IJ SOLN
1000.0000 ug | Freq: Once | INTRAMUSCULAR | Status: AC
  Administered 2014-05-23: 1000 ug via INTRAMUSCULAR

## 2014-05-26 ENCOUNTER — Telehealth: Payer: Self-pay | Admitting: Internal Medicine

## 2014-05-26 NOTE — Telephone Encounter (Signed)
Pt states that she is having problems getting her potassium chloride (KLOR-CON M10) 10 MEQ tablet  refilled. Old pharmacy was [facility] on [address], pt now wants all rx's to go to [facility] on [address], however pt states [facility] will not refill meds because it is not time.  Pt states she will be completely out of pills tomorrow, pt states the instructions changed, she now takes 2 pills a day and that is why she is out.  Can a rx of potassium chloride (KLOR-CON M10) 10 MEQ tablet with new instructions to state she is taking pills twice a day, be called into [place], [place]. [phone] (Phone) [phone] (Fax)? Please advise.

## 2014-05-27 ENCOUNTER — Telehealth: Payer: Self-pay | Admitting: Internal Medicine

## 2014-05-27 MED ORDER — POTASSIUM CHLORIDE CRYS ER 10 MEQ PO TBCR: EXTENDED_RELEASE_TABLET | ORAL | Status: DC

## 2014-05-27 NOTE — Telephone Encounter (Signed)
Kimberly Silva needs verification on dosage of rx potassium chloride (KLOR-CON M10) 10 MEQ tablet, pt states she was told to take 2 daily, however the rx is written for 1 daily, pt is now out.

## 2014-05-27 NOTE — Telephone Encounter (Signed)
Originally sent to the pharmacy on 05/10/14 for 2 daily.  Will resend.

## 2014-06-01 ENCOUNTER — Other Ambulatory Visit: Payer: Self-pay | Admitting: Family Medicine

## 2014-06-06 ENCOUNTER — Other Ambulatory Visit: Payer: Self-pay | Admitting: Internal Medicine

## 2014-06-07 ENCOUNTER — Other Ambulatory Visit: Payer: Self-pay | Admitting: Internal Medicine

## 2014-06-07 NOTE — Telephone Encounter (Signed)
Denied.  Called and spoke to the pharmacy.  Pt has refills on file.  Request not needed.

## 2014-06-08 ENCOUNTER — Other Ambulatory Visit: Payer: Self-pay | Admitting: Internal Medicine

## 2014-06-09 NOTE — Telephone Encounter (Signed)
Sent to the pharmacy by e-scribe. 

## 2014-06-10 NOTE — Telephone Encounter (Signed)
I called the pharmacy to make sure potassium was on file.  They received it on 05/27/14 and the patient picked up her prescription on 05/28/14.  All prescription will be sent to [facility] in the future.

## 2014-06-10 NOTE — Telephone Encounter (Signed)
Pt wants to make sure all her rx go to [facility] from now on.

## 2014-06-20 ENCOUNTER — Ambulatory Visit (INDEPENDENT_AMBULATORY_CARE_PROVIDER_SITE_OTHER): Payer: Medicare Other | Admitting: Family Medicine

## 2014-06-20 DIAGNOSIS — D519 Vitamin B12 deficiency anemia, unspecified: Secondary | ICD-10-CM

## 2014-06-20 DIAGNOSIS — D518 Other vitamin B12 deficiency anemias: Secondary | ICD-10-CM

## 2014-06-20 MED ORDER — CYANOCOBALAMIN 1000 MCG/ML IJ SOLN
1000.0000 ug | Freq: Once | INTRAMUSCULAR | Status: AC
  Administered 2014-06-20: 1000 ug via INTRAMUSCULAR

## 2014-07-05 ENCOUNTER — Ambulatory Visit (INDEPENDENT_AMBULATORY_CARE_PROVIDER_SITE_OTHER): Payer: Medicare Other | Admitting: Internal Medicine

## 2014-07-05 ENCOUNTER — Encounter: Payer: Self-pay | Admitting: Internal Medicine

## 2014-07-05 VITALS — BP 160/70 | Temp 98.2°F | Ht 60.25 in | Wt 128.0 lb

## 2014-07-05 DIAGNOSIS — D519 Vitamin B12 deficiency anemia, unspecified: Secondary | ICD-10-CM

## 2014-07-05 DIAGNOSIS — F4322 Adjustment disorder with anxiety: Secondary | ICD-10-CM

## 2014-07-05 DIAGNOSIS — D518 Other vitamin B12 deficiency anemias: Secondary | ICD-10-CM

## 2014-07-05 DIAGNOSIS — R110 Nausea: Secondary | ICD-10-CM

## 2014-07-05 DIAGNOSIS — I10 Essential (primary) hypertension: Secondary | ICD-10-CM

## 2014-07-05 MED ORDER — PANTOPRAZOLE SODIUM 40 MG PO TBEC: DELAYED_RELEASE_TABLET | ORAL | Status: DC

## 2014-07-05 MED ORDER — CYANOCOBALAMIN 1000 MCG/ML IJ SOLN: 1000.0000 ug | INTRAMUSCULAR | Status: DC

## 2014-07-05 NOTE — Patient Instructions (Addendum)
Ok for now to  Use the lorazepam   As needed and perhaps after   Stress decreases   Need may  Go away and can wand down . Ok ot use whole pill before  stressful meeting.  Ok to do   [facility]   Will send in rx to [facility]. Blood pressure is up today  Have BP readings  Done    every day for 10 days and send in readings.  And then once a week.  OV in 4 months or as needed

## 2014-07-05 NOTE — Progress Notes (Signed)
Pre visit review using our clinic review tool, if applicable. No additional management support is needed unless otherwise documented below in the visit note.  Chief Complaint  Patient presents with  . Follow-up    HPI: Kimberly Silva comes in today with her daughter.   Fu of nausea and anxiety and use of ativan  Better mostly ocass nausea pre barthroon.   Taking Parkinson's medicines 3 times a day but the mid day dose 1/2 in day. Forgets a lot. Not felt to have significant memory problems according to her daughter and to her  bp is 1 been checking it much continue taking potassium   hasnt been  Checking  bp readings .  Stress yesterday.   Anxiety Taking 1/2    Ativan  Most days. Asked permission to take a whole one before going to the lawyer's office. About rearranging the state and her house. Should get better soon  Asks if she can [facility] B[facility] this and indicates the pharmacy to begin on August 3. ROS: See pertinent positives and negatives per HPI. No chest pain shortness of breath had some leg pain when trying to do cheerio got may go back to tai chi.  Past Medical History  Diagnosis Date  . HYPERLIPIDEMIA 2009/03/23  . HYPOKALEMIA 08/17/2007  . ANEMIA, B12 DEFICIENCY 03/13/2009  . HYPERTENSION 05/14/2007  . Blood in stool 01/25/2008  . OSTEOARTHRITIS 05/14/2007  . OSTEOPENIA 05/14/2007    March 23, 2006 hx of fosamax use   . TREMOR 07/08/2007  . FREQUENCY, URINARY 08/19/2008  . LUMBAR STRAIN 09/21/2007  . Gastric polyp     panendo  5 2008-03-23  . History of ankle fracture   . Parkinson's disease   . Adenomatous colon polyp     Family History  Problem Relation Age of Onset  . Heart attack Mother   . Coronary artery disease Mother   . Colon cancer Sister     x 2  . Breast cancer Sister     x 4  . Parkinsonism Brother   . Peripheral vascular disease      sibling  . Stroke Father   . Colon cancer Other     nephew  . Colon cancer  Paternal Aunt   . Breast cancer Sister   . Breast cancer Sister     History   Social History  . Marital Status: Widowed    Spouse Name: N/A    Number of Children: 39  . Years of Education: BA   Occupational History  . Retired     Pharmacist, hospital   Social History Main Topics  . Smoking status: Never Smoker   . Smokeless tobacco: Never Used  . Alcohol Use: No  . Drug Use: No  . Sexual Activity: None   Other Topics Concern  . None   Social History Narrative   Husband died in [place] in 03-23-08 of leukemia   Widowed   Recently moved to   Retirement assisted living home.   [facility]   G7P5   Caffeine Use: 1 soda daily    Outpatient Encounter Prescriptions as of 07/05/2014  Medication Sig  . aspirin 81 MG tablet Take 81 mg by mouth daily.    . carbidopa-levodopa (SINEMET IR) 25-100 MG per tablet Take 1 tablet by mouth 3 (three) times daily.  . Cholecalciferol (VITAMIN D3) 1000 UNITS CAPS Take by mouth.  . Clobetasol Propionate 0.05 % lotion Apply topically daily.   Marland Kitchen  cyanocobalamin 1000 MCG/ML injection Inject into the muscle.    . fluticasone (FLONASE) 50 MCG/ACT nasal spray USE 2 SPRAYS IN EACH NOSTRIL ONCE A DAY  . LORazepam (ATIVAN) 0.5 MG tablet Take 1 tablet (0.5 mg total) by mouth 2 (two) times daily as needed for anxiety.  . Magnesium 250 MG TABS Take 1 each by mouth daily.  . pantoprazole (PROTONIX) 40 MG tablet TAKE 1 TABLET ONCE DAILY.  Kimberly Silva Glycol-Propyl Glycol (SYSTANE) 0.4-0.3 % SOLN Apply to eye.   . polyethylene glycol (MIRALAX / GLYCOLAX) packet Take 17 g by mouth daily.  . potassium chloride (KLOR-CON M10) 10 MEQ tablet TAKE 2 TABLETS  (20 MEQ TOTAL) BY MOUTH DAILY.  Marland Kitchen propranolol ER (INDERAL LA) 120 MG 24 hr capsule TAKE ONE CAPSULE BY MOUTH EVERY DAY  . triamterene-hydrochlorothiazide (MAXZIDE-25) 37.5-25 MG per tablet TAKE 1 TABLET BY MOUTH EVERY DAY  . [DISCONTINUED] pantoprazole (PROTONIX) 40 MG tablet TAKE 1 TABLET ONCE DAILY.  .  cyanocobalamin (,VITAMIN B-12,) 1000 MCG/ML injection Inject 1 mL (1,000 mcg total) into the muscle every 30 (thirty) days.    EXAM:  BP 160/70  Temp(Src) 98.2 F (36.8 C) (Oral)  Ht 5' 0.25" (1.53 m)  Wt 128 lb (58.06 kg)  BMI 24.80 kg/m2  Body mass index is 24.8 kg/(m^2). Repeat blood pressure right 160/70 left 154/70 GENERAL: vitals reviewed and listed above, alert, oriented, appears well hydrated and in no acute distress no active tremor at rest HEENT: atraumatic, conjunctiva  clear, no obvious abnormalities on inspection of external nose and ears NECK: no obvious masses on inspection palpation  LUNGS: clear to auscultation bilaterally, no wheezes, rales or rhonchi,  CV: HRRR, no clubbing cyanosis or  peripheral edema nl cap refill  MS: moves all extremities without noticeable focal  Abnormality nail changes Skin senile ecchymoses otherwise no acute changes PSYCH: pleasant and cooperative, no obvious depression minimally anxious normal cognition and speech. BP Readings from Last 3 Encounters:  07/05/14 160/70  05/13/14 150/60  03/24/14 150/74   Wt Readings from Last 3 Encounters:  07/05/14 128 lb (58.06 kg)  05/13/14 128 lb (58.06 kg)  03/24/14 123 lb (55.792 kg)   Lab Results  Component Value Date   WBC 8.8 03/24/2014   HGB 13.4 03/24/2014   HCT 40.2 03/24/2014   PLT 257.0 03/24/2014   GLUCOSE 83 05/13/2014   CHOL 199 04/24/2012   TRIG 122.0 04/24/2012   HDL 69.30 04/24/2012   LDLDIRECT 101.1 04/16/2011   LDLCALC 105* 04/24/2012   ALT 3 03/24/2014   AST 12 03/24/2014   NA 135 05/13/2014   K 3.9 05/13/2014   CL 98 05/13/2014   CREATININE 1.0 05/13/2014   BUN 16 05/13/2014   CO2 31 05/13/2014   TSH 0.81 01/05/2014     ASSESSMENT AND PLAN:  Discussed the following assessment and plan:  Unspecified essential hypertension - Readings are up today need some monitoring when not anxious in the office goal below 150  Nausea - Better under treatment for anxiety discussed ins and outs of  these lorazepam may be able to use less after estate planning sessions.  Anemia, B12 deficiency - ok to get [facility]   Adjustment disorder with anxious mood Keep fu appt  Consider labs  Then if needed   Risk benefit of medication discussed. At this time more bnenfot than risk  Of the ativan -Patient advised to return or notify health care team  if symptoms worsen ,persist  or new concerns arise.  Patient Instructions  Ok for now to  Use the lorazepam   As needed and perhaps after   Stress decreases   Need may  Go away and can wand down . Ok ot use whole pill before  stressful meeting.  Ok to do   [facility].   Will send in rx to [facility]. Blood pressure is up today  Have BP readings  Done    every day for 10 days and send in readings.  And then once a week.  OV in 4 months or as needed       Standley Brooking. Panosh M.D.  Total visit 80mins > 50% spent counseling and coordinating care

## 2014-07-06 ENCOUNTER — Encounter: Payer: Self-pay | Admitting: Internal Medicine

## 2014-07-07 ENCOUNTER — Encounter: Payer: Self-pay | Admitting: Internal Medicine

## 2014-07-08 ENCOUNTER — Encounter: Payer: Self-pay | Admitting: Internal Medicine

## 2014-07-09 ENCOUNTER — Encounter: Payer: Self-pay | Admitting: Internal Medicine

## 2014-07-10 ENCOUNTER — Encounter: Payer: Self-pay | Admitting: Internal Medicine

## 2014-07-11 ENCOUNTER — Encounter: Payer: Self-pay | Admitting: Internal Medicine

## 2014-07-12 ENCOUNTER — Encounter: Payer: Self-pay | Admitting: Internal Medicine

## 2014-07-19 ENCOUNTER — Observation Stay (HOSPITAL_COMMUNITY)
Admission: EM | Admit: 2014-07-19 | Discharge: 2014-07-21 | Disposition: A | Discharge status: Home / Self Care | Payer: Medicare Other | Attending: Internal Medicine | Admitting: Internal Medicine

## 2014-07-19 ENCOUNTER — Emergency Department (HOSPITAL_COMMUNITY): Payer: Medicare Other

## 2014-07-19 ENCOUNTER — Encounter (HOSPITAL_COMMUNITY): Payer: Self-pay | Admitting: Emergency Medicine

## 2014-07-19 DIAGNOSIS — G20 Parkinson's disease: Secondary | ICD-10-CM | POA: Diagnosis not present

## 2014-07-19 DIAGNOSIS — Z860100 Personal history of colon polyps, unspecified: Secondary | ICD-10-CM | POA: Insufficient documentation

## 2014-07-19 DIAGNOSIS — R55 Syncope and collapse: Principal | ICD-10-CM

## 2014-07-19 DIAGNOSIS — M899 Disorder of bone, unspecified: Secondary | ICD-10-CM | POA: Insufficient documentation

## 2014-07-19 DIAGNOSIS — E538 Deficiency of other specified B group vitamins: Secondary | ICD-10-CM

## 2014-07-19 DIAGNOSIS — E876 Hypokalemia: Secondary | ICD-10-CM | POA: Insufficient documentation

## 2014-07-19 DIAGNOSIS — E785 Hyperlipidemia, unspecified: Secondary | ICD-10-CM | POA: Diagnosis present

## 2014-07-19 DIAGNOSIS — M949 Disorder of cartilage, unspecified: Secondary | ICD-10-CM

## 2014-07-19 DIAGNOSIS — I498 Other specified cardiac arrhythmias: Secondary | ICD-10-CM | POA: Insufficient documentation

## 2014-07-19 DIAGNOSIS — M1990 Unspecified osteoarthritis, unspecified site: Secondary | ICD-10-CM | POA: Insufficient documentation

## 2014-07-19 DIAGNOSIS — I10 Essential (primary) hypertension: Secondary | ICD-10-CM

## 2014-07-19 DIAGNOSIS — Z7982 Long term (current) use of aspirin: Secondary | ICD-10-CM | POA: Diagnosis not present

## 2014-07-19 DIAGNOSIS — D518 Other vitamin B12 deficiency anemias: Secondary | ICD-10-CM | POA: Diagnosis present

## 2014-07-19 DIAGNOSIS — G20A1 Parkinson's disease without dyskinesia, without mention of fluctuations: Secondary | ICD-10-CM | POA: Insufficient documentation

## 2014-07-19 LAB — CBC WITH DIFFERENTIAL/PLATELET
Basophils Absolute: 0 10*3/uL (ref 0.0–0.1)
Basophils Relative: 1 % (ref 0–1)
EOS ABS: 0.1 10*3/uL (ref 0.0–0.7)
Eosinophils Relative: 2 % (ref 0–5)
HCT: 38 % (ref 36.0–46.0)
Hemoglobin: 12.3 g/dL (ref 12.0–15.0)
LYMPHS ABS: 1.2 10*3/uL (ref 0.7–4.0)
Lymphocytes Relative: 16 % (ref 12–46)
MCH: 27.8 pg (ref 26.0–34.0)
MCHC: 32.4 g/dL (ref 30.0–36.0)
MCV: 86 fL (ref 78.0–100.0)
Monocytes Absolute: 0.4 10*3/uL (ref 0.1–1.0)
Monocytes Relative: 6 % (ref 3–12)
NEUTROS PCT: 75 % (ref 43–77)
Neutro Abs: 5.8 10*3/uL (ref 1.7–7.7)
PLATELETS: 205 10*3/uL (ref 150–400)
RBC: 4.42 MIL/uL (ref 3.87–5.11)
RDW: 13 % (ref 11.5–15.5)
WBC: 7.6 10*3/uL (ref 4.0–10.5)

## 2014-07-19 LAB — URINALYSIS, ROUTINE W REFLEX MICROSCOPIC
Bilirubin Urine: NEGATIVE
GLUCOSE, UA: NEGATIVE mg/dL
Hgb urine dipstick: NEGATIVE
KETONES UR: NEGATIVE mg/dL
LEUKOCYTES UA: NEGATIVE
NITRITE: NEGATIVE
PROTEIN: NEGATIVE mg/dL
Specific Gravity, Urine: 1.012 (ref 1.005–1.030)
UROBILINOGEN UA: 0.2 mg/dL (ref 0.0–1.0)
pH: 7.5 (ref 5.0–8.0)

## 2014-07-19 LAB — BASIC METABOLIC PANEL
ANION GAP: 13 (ref 5–15)
BUN: 17 mg/dL (ref 6–23)
CALCIUM: 9 mg/dL (ref 8.4–10.5)
CO2: 26 mEq/L (ref 19–32)
Chloride: 99 mEq/L (ref 96–112)
Creatinine, Ser: 1.03 mg/dL (ref 0.50–1.10)
GFR calc Af Amer: 55 mL/min — ABNORMAL LOW (ref >90)
GFR, EST NON AFRICAN AMERICAN: 48 mL/min — AB (ref >90)
GLUCOSE: 102 mg/dL — AB (ref 70–99)
POTASSIUM: 4.3 meq/L (ref 3.7–5.3)
SODIUM: 138 meq/L (ref 137–147)

## 2014-07-19 LAB — TROPONIN I: Troponin I: <0.3 ng/mL (ref <0.30)

## 2014-07-19 MED ORDER — PROPRANOLOL HCL ER 120 MG PO CP24
120.0000 mg | ORAL_CAPSULE | Freq: Every day | ORAL | Status: DC
  Filled 2014-07-19: qty 1

## 2014-07-19 MED ORDER — FLUTICASONE PROPIONATE 50 MCG/ACT NA SUSP
2.0000 | Freq: Every day | NASAL | Status: DC
  Administered 2014-07-19 – 2014-07-20 (×2): 2 via NASAL
  Filled 2014-07-19: qty 16

## 2014-07-19 MED ORDER — POTASSIUM CHLORIDE ER 10 MEQ PO TBCR
20.0000 meq | EXTENDED_RELEASE_TABLET | Freq: Every day | ORAL | Status: DC
  Administered 2014-07-20 – 2014-07-21 (×2): 20 meq via ORAL
  Filled 2014-07-19 (×3): qty 2

## 2014-07-19 MED ORDER — SODIUM CHLORIDE 0.9 % IJ SOLN: 3.0000 mL | INTRAMUSCULAR | Status: DC | PRN

## 2014-07-19 MED ORDER — ENOXAPARIN SODIUM 40 MG/0.4ML ~~LOC~~ SOLN
40.0000 mg | SUBCUTANEOUS | Status: DC
  Administered 2014-07-20: 40 mg via SUBCUTANEOUS
  Filled 2014-07-19 (×3): qty 0.4

## 2014-07-19 MED ORDER — SODIUM CHLORIDE 0.9 % IV SOLN: 250.0000 mL | INTRAVENOUS | Status: DC | PRN

## 2014-07-19 MED ORDER — PANTOPRAZOLE SODIUM 40 MG PO TBEC
40.0000 mg | DELAYED_RELEASE_TABLET | Freq: Every day | ORAL | Status: DC
  Administered 2014-07-20 – 2014-07-21 (×2): 40 mg via ORAL
  Filled 2014-07-19 (×3): qty 1

## 2014-07-19 MED ORDER — LORAZEPAM 0.5 MG PO TABS
0.2500 mg | ORAL_TABLET | Freq: Two times a day (BID) | ORAL | Status: DC | PRN
  Administered 2014-07-20 – 2014-07-21 (×2): 0.5 mg via ORAL
  Filled 2014-07-19 (×2): qty 1

## 2014-07-19 MED ORDER — ONDANSETRON HCL 4 MG/2ML IJ SOLN: 4.0000 mg | Freq: Four times a day (QID) | INTRAMUSCULAR | Status: DC | PRN

## 2014-07-19 MED ORDER — POLYETHYL GLYCOL-PROPYL GLYCOL 0.4-0.3 % OP SOLN: 1.0000 [drp] | Freq: Two times a day (BID) | OPHTHALMIC | Status: DC

## 2014-07-19 MED ORDER — SODIUM CHLORIDE 0.9 % IJ SOLN
3.0000 mL | Freq: Two times a day (BID) | INTRAMUSCULAR | Status: DC
  Administered 2014-07-19: 3 mL via INTRAVENOUS

## 2014-07-19 MED ORDER — SODIUM CHLORIDE 0.9 % IJ SOLN
3.0000 mL | Freq: Two times a day (BID) | INTRAMUSCULAR | Status: DC
  Administered 2014-07-20 – 2014-07-21 (×3): 3 mL via INTRAVENOUS

## 2014-07-19 MED ORDER — VITAMIN D3 25 MCG (1000 UT) PO CAPS
1000.0000 [IU] | ORAL_CAPSULE | Freq: Every day | ORAL | Status: DC
  Administered 2014-07-20: 1000 [IU] via ORAL
  Filled 2014-07-19 (×3): qty 1

## 2014-07-19 MED ORDER — CYANOCOBALAMIN 1000 MCG/ML IJ SOLN: 1000.0000 ug | INTRAMUSCULAR | Status: DC

## 2014-07-19 MED ORDER — ALBUTEROL SULFATE (2.5 MG/3ML) 0.083% IN NEBU: 2.5000 mg | INHALATION_SOLUTION | RESPIRATORY_TRACT | Status: DC | PRN

## 2014-07-19 MED ORDER — GUAIFENESIN-DM 100-10 MG/5ML PO SYRP: 5.0000 mL | ORAL_SOLUTION | ORAL | Status: DC | PRN

## 2014-07-19 MED ORDER — CARBIDOPA-LEVODOPA 25-100 MG PO TABS
0.5000 | ORAL_TABLET | Freq: Three times a day (TID) | ORAL | Status: DC
  Administered 2014-07-19 – 2014-07-21 (×4): 1 via ORAL
  Filled 2014-07-19 (×7): qty 1

## 2014-07-19 MED ORDER — POLYVINYL ALCOHOL 1.4 % OP SOLN
1.0000 [drp] | Freq: Two times a day (BID) | OPHTHALMIC | Status: DC
  Administered 2014-07-19 – 2014-07-21 (×4): 1 [drp] via OPHTHALMIC
  Filled 2014-07-19: qty 15

## 2014-07-19 MED ORDER — MAGNESIUM 250 MG PO TABS
250.0000 mg | ORAL_TABLET | Freq: Every day | ORAL | Status: DC
  Administered 2014-07-20: 250 mg via ORAL
  Filled 2014-07-19: qty 1

## 2014-07-19 MED ORDER — ACETAMINOPHEN 650 MG RE SUPP: 650.0000 mg | Freq: Four times a day (QID) | RECTAL | Status: DC | PRN

## 2014-07-19 MED ORDER — POLYETHYLENE GLYCOL 3350 17 G PO PACK
17.0000 g | PACK | ORAL | Status: DC
  Filled 2014-07-19 (×2): qty 1

## 2014-07-19 MED ORDER — ACETAMINOPHEN 325 MG PO TABS: 650.0000 mg | ORAL_TABLET | Freq: Four times a day (QID) | ORAL | Status: DC | PRN

## 2014-07-19 MED ORDER — TRIAMTERENE-HCTZ 37.5-25 MG PO TABS
1.0000 | ORAL_TABLET | Freq: Every day | ORAL | Status: DC
  Administered 2014-07-20 – 2014-07-21 (×2): 1 via ORAL
  Filled 2014-07-19 (×2): qty 1

## 2014-07-19 MED ORDER — ONDANSETRON HCL 4 MG PO TABS: 4.0000 mg | ORAL_TABLET | Freq: Four times a day (QID) | ORAL | Status: DC | PRN

## 2014-07-19 MED ORDER — ASPIRIN 81 MG PO TABS
81.0000 mg | ORAL_TABLET | Freq: Every day | ORAL | Status: DC
  Administered 2014-07-20 – 2014-07-21 (×2): 81 mg via ORAL
  Filled 2014-07-19 (×2): qty 1

## 2014-07-19 NOTE — ED Notes (Signed)
Admitting physician at the bedside to evaluate.

## 2014-07-19 NOTE — ED Notes (Signed)
Pt up to bathroom with assistance. Vital signs stable. No signs of distress noted. Pt remains alert and oriented x4.

## 2014-07-19 NOTE — ED Provider Notes (Signed)
I saw and evaluated the patient, reviewed the resident's note and I agree with the findings and plan.   EKG Interpretation   Date/Time:  Tuesday July 19 2014 14:19:43 EDT Ventricular Rate:  62 PR Interval:  180 QRS Duration: 97 QT Interval:  431 QTC Calculation: 438 R Axis:   27 Text Interpretation:  Sinus rhythm Baseline wander in lead(s) I II aVR No  old tracing to compare Confirmed by [facility]  MD, TREY ([id number]) on 07/19/2014  3:07:26 PM      80 yo female presenting after two episodes of decreased responsiveness.  Pt thinks she lost consciousness briefly during these episodes.  On exam, well appearing, nontoxic, not distressed, normal respiratory effort, normal perfusion, alert and oriented.  Plan labs, CT head, EKG.    Admit for syncope eval.  Clinical Impression: 1. Syncope, unspecified syncope type   2. Vitamin B12 deficiency   3. Unspecified essential hypertension       Houston Siren III, MD 07/20/14 [phone]

## 2014-07-19 NOTE — ED Notes (Signed)
Pt returned to exam room from CT. No signs of distress noted. Pt remains alert and oriented x4. NAD. Family at bedside.

## 2014-07-19 NOTE — ED Notes (Signed)
Assisted to BR, steady on feet.

## 2014-07-19 NOTE — ED Notes (Signed)
Dr. wofford at bedside.  

## 2014-07-19 NOTE — H&P (Signed)
PATIENT DETAILS Name: Kimberly Silva Age: 80 y.o. Sex: female Date of Birth: 1926/04/07 Admit Date: 07/19/2014 EYC:XKGYJE,HUDJS KOTVAN, MD   CHIEF COMPLAINT:  Lightheadedness, "staring into space"- 2 episodes earlier today  HPI: Kimberly Silva is a 80 y.o. female with a Past Medical History of Parkinson's disease, hypertension, dyslipidemia who presents today with the above noted complaint. Patient lives in independent living facility, early around 48 AM while sitting in a chair, she had a "crazy" feeling, she thinks she may have passed out but is not sure if she at least doxed off to sleep. When she came to, she was back to her usual self, she did not notice any tongue bite or urinary or fecal incontinence. Later during the day, while her daughter was with her, she had an episode of lightheadedness which the daughter witnessed and made her sit down on a chair. While sitting down, she had a very brief period "lasting seconds" of shakiness of her arms (more than usual of Parkinson's tremors) and a staring episode. She was then brought to the emergency room for further evaluation. I was subsequently asked to admit this patient. Patient denies any chest pain, shortness of breath, urinary incontinence, fecal incontinence or tongue bite. She has no history of seizures or syncope in the past.  ALLERGIES:  No Known Allergies  PAST MEDICAL HISTORY: Past Medical History  Diagnosis Date  . HYPERLIPIDEMIA 02/21/2009  . HYPOKALEMIA 08/17/2007  . ANEMIA, B12 DEFICIENCY 03/13/2009  . HYPERTENSION 05/14/2007  . Blood in stool 01/25/2008  . OSTEOARTHRITIS 05/14/2007  . OSTEOPENIA 05/14/2007    2007 hx of fosamax use   . TREMOR 07/08/2007  . FREQUENCY, URINARY 08/19/2008  . LUMBAR STRAIN 09/21/2007  . Gastric polyp     panendo  5 2009  . History of ankle fracture   . Parkinson's disease   . Adenomatous colon polyp     PAST SURGICAL HISTORY: Past Surgical History  Procedure Laterality Date  .  Abdominal hysterectomy    . Fracture  2007    left ankle- Hx of fosamax use  . Dilation and curettage of uterus    . Cataract extraction, bilateral    . Colonoscopy N/A 07/19/2013    Procedure: COLONOSCOPY;  Surgeon: Lafayette Dragon, MD;  Location: [facility];  Service: Endoscopy;  Laterality: N/A;    MEDICATIONS AT HOME: Prior to Admission medications   Medication Sig Start Date End Date Taking? Authorizing Provider  aspirin 81 MG tablet Take 81 mg by mouth daily.     Yes Historical Provider, MD  carbidopa-levodopa (SINEMET IR) 25-100 MG per tablet Take 0.5-1 tablets by mouth 3 (three) times daily. Take 1 tablet every morning and every evening and take 0.5 tablet every afternoon   Yes Historical Provider, MD  Cholecalciferol (VITAMIN D3) 1000 UNITS CAPS Take 1,000 Units by mouth daily.    Yes Historical Provider, MD  cyanocobalamin (,VITAMIN B-12,) 1000 MCG/ML injection Inject 1 mL (1,000 mcg total) into the muscle every 30 (thirty) days. 07/05/14  Yes Burnis Medin, MD  fluticasone (FLONASE) 50 MCG/ACT nasal spray Place 2 sprays into both nostrils at bedtime.   Yes Historical Provider, MD  LORazepam (ATIVAN) 0.5 MG tablet Take 0.25-5 mg by mouth 2 (two) times daily as needed for anxiety.   Yes Historical Provider, MD  Magnesium 250 MG TABS Take 250 mg by mouth daily.    Yes Historical Provider, MD  pantoprazole (PROTONIX) 40 MG tablet Take 40  mg by mouth daily.   Yes Historical Provider, MD  Polyethyl Glycol-Propyl Glycol (SYSTANE) 0.4-0.3 % SOLN Apply 1 drop to eye 2 (two) times daily.    Yes Historical Provider, MD  polyethylene glycol (MIRALAX / GLYCOLAX) packet Take 17 g by mouth every other day.    Yes Historical Provider, MD  potassium chloride (K-DUR) 10 MEQ tablet Take 20 mEq by mouth daily.   Yes Historical Provider, MD  propranolol ER (INDERAL LA) 120 MG 24 hr capsule Take 120 mg by mouth daily.   Yes Historical Provider, MD  triamterene-hydrochlorothiazide (MAXZIDE-25) 37.5-25 MG  per tablet Take 1 tablet by mouth daily.   Yes Historical Provider, MD    FAMILY HISTORY: Family History  Problem Relation Age of Onset  . Heart attack Mother   . Coronary artery disease Mother   . Colon cancer Sister     x 2  . Breast cancer Sister     x 4  . Parkinsonism Brother   . Peripheral vascular disease      sibling  . Stroke Father   . Colon cancer Other     nephew  . Colon cancer Paternal Aunt   . Breast cancer Sister   . Breast cancer Sister     SOCIAL HISTORY:  reports that she has never smoked. She has never used smokeless tobacco. She reports that she does not drink alcohol or use illicit drugs.  REVIEW OF SYSTEMS:  Constitutional:   No  weight loss, night sweats,  Fevers  HEENT:    No headaches, Difficulty swallowing,Tooth/dental problems,Sore throat.  Cardio-vascular: No chest pain,  Orthopnea, PND, swelling in lower extremities.  GI:  No heartburn, indigestion, abdominal pain, nausea, vomiting, diarrhea, change in bowel habits, loss of appetite  Resp: No shortness of breath with exertion or at rest.  No excess mucus, no productive cough, No non-productive cough,  No coughing up of blood  Skin:  no rash or lesions.  GU:  no dysuria, change in color of urine, no urgency or frequency.  No flank pain.  Musculoskeletal: No joint pain or swelling.  No decreased range of motion.  No back pain.  Psych: No change in mood or affect. No depression or anxiety.  No memory loss.   PHYSICAL EXAM: Blood pressure 172/60, pulse 61, temperature 98.7 F (37.1 C), temperature source Oral, SpO2 99.00%.  General appearance :Awake, alert, not in any distress. Speech Clear. Not toxic Looking HEENT: Atraumatic and Normocephalic, pupils equally reactive to light and accomodation Neck: supple, no JVD. No cervical lymphadenopathy.  Chest:Good air entry bilaterally, no added sounds  CVS: S1 S2 regular, no murmurs.  Abdomen: Bowel sounds present, Non tender and not  distended with no gaurding, rigidity or rebound. Extremities: B/L Lower Ext shows no edema, both legs are warm to touch Neurology: Awake alert, and oriented X 3, CN II-XII intact, Non focal Skin:No Rash Wounds:N/A  LABS ON ADMISSION:   Recent Labs  07/19/14 1438  NA 138  K 4.3  CL 99  CO2 26  GLUCOSE 102*  BUN 17  CREATININE 1.03  CALCIUM 9.0   No results found for this basename: AST, ALT, ALKPHOS, BILITOT, PROT, ALBUMIN,  in the last 72 hours No results found for this basename: LIPASE, AMYLASE,  in the last 72 hours  Recent Labs  07/19/14 1438  WBC 7.6  NEUTROABS 5.8  HGB 12.3  HCT 38.0  MCV 86.0  PLT 205   No results found for this basename: CKTOTAL,  CKMB, CKMBINDEX, TROPONINI,  in the last 72 hours No results found for this basename: DDIMER,  in the last 72 hours No components found with this basename: POCBNP,    RADIOLOGIC STUDIES ON ADMISSION: Ct Head Wo Contrast  07/19/2014   CLINICAL DATA:  Near syncopal episode today.  EXAM: CT HEAD WITHOUT CONTRAST  TECHNIQUE: Contiguous axial images were obtained from the base of the skull through the vertex without intravenous contrast.  COMPARISON:  None.  FINDINGS: Mild diffuse subcortical white matter hypoattenuation is present bilaterally. No acute cortical infarct, hemorrhage, or mass lesion is present. The ventricles are of normal size. No significant extra-axial fluid collection is present.  The paranasal sinuses and mastoid air cells are clear. No significant extracranial soft tissue injury is evident.  IMPRESSION: 1. Mild diffuse white matter disease likely reflects the sequelae of chronic microvascular ischemia. 2. No acute intracranial abnormality. 3. No evidence for acute trauma.   Electronically Signed   By: Lawrence Santiago M.D.   On: 07/19/2014 16:05   Dg Chest Portable 1 View  07/19/2014   CLINICAL DATA:  Numbness.  EXAM: PORTABLE CHEST - 1 VIEW  COMPARISON:  None available.  FINDINGS: The lung volumes are low. The  heart size is normal. Mild interstitial coarsening is likely chronic. The visualized soft tissues and bony thorax are unremarkable.  IMPRESSION: 1. No acute cardiopulmonary disease. 2. Mild interstitial coarsening is likely chronic.   Electronically Signed   By: Lawrence Santiago M.D.   On: 07/19/2014 15:13     EKG: Independently reviewed. Normal sinus rhythm  ASSESSMENT AND PLAN: Present on Admission:  .Presyncope/Syncope - Not sure what the etiology is, given history of Parkinson's could have autonomic dysfunction and orthostatic hypotension. For now we'll check orthostatics, monitor in telemetry, obtain echocardiogram, EEG and MRI brain- although seizures not likely. Obtain PT eval. Patient was monitored closely and further plans will determine as the clinical course evolves.   . Parkinsonian tremor - Continue with Sinemet   . HYPOKALEMIA - Continue KCL, check electrolytes periodically when inpatient   . HYPERTENSION - Continue usual antihypertensive medications.   Further plan will depend as patient's clinical course evolves and further radiologic and laboratory data become available. Patient will be monitored closely.   Above noted plan was discussed with patient/daughter, they were in agreement.   DVT Prophylaxis: Prophylactic Lovenox   Code Status: Full Code  Total time spent for admission equals 45 minutes.  [facility] Pager [phone]  If 7PM-7AM, please contact night-coverage [email or web address] Password [email or web address] 07/19/2014, 5:06 PM  **Disclaimer: This note may have been dictated with voice recognition software. Similar sounding words can inadvertently be transcribed and this note may contain transcription errors which may not have been corrected upon publication of note.**

## 2014-07-19 NOTE — ED Notes (Signed)
To ED via [facility] from [facility]-- with c/o an episode of "staring and shaking" per daughter and numbness in face-- denies any numbness currently. Pt has a slight facial droop to left side of mouth-- states that is normal. EMS states that daughter stated episode lasted just a few seconds.

## 2014-07-19 NOTE — ED Provider Notes (Signed)
CSN: [id number]     Arrival date & time 07/19/14  1339 History   First MD Initiated Contact with Patient 07/19/14 1408     Chief Complaint  Patient presents with  . Numbness   Kimberly Silva is an 80 yo caucasian F w/PMH of Parkinson's and HTN who presents today after syncopal like episode. At around noon today patient was sitting in her recliner when she stared off into space are witnessed this. This lasted for a couple of seconds. Pt describes the episode like a fainting episode. A prior occasion that was similar in sensation occurred earlier today after patient had gotten up to walk around. Patient admits that she felt a tingling sensation but cannot describe where she felt that she feels like her whole body was tingling for a few seconds. No head trauma. No headache.  She denies CP, SOB, fever, chills, constipation, hematemesis, dysuria, hematuria, sick contacts, or recent travel.   (Consider location/radiation/quality/duration/timing/severity/associated sxs/prior Treatment) Patient is a 80 y.o. female presenting with near-syncope.  Near Syncope This is a new problem. The current episode started today. The problem has been resolved. Associated symptoms include numbness. Pertinent negatives include no abdominal pain, chest pain, chills, diaphoresis, fever, headaches, nausea, vomiting or weakness. Nothing aggravates the symptoms. She has tried nothing for the symptoms.    Past Medical History  Diagnosis Date  . HYPERLIPIDEMIA 02/21/2009  . HYPOKALEMIA 08/17/2007  . ANEMIA, B12 DEFICIENCY 03/13/2009  . HYPERTENSION 05/14/2007  . Blood in stool 01/25/2008  . OSTEOARTHRITIS 05/14/2007  . OSTEOPENIA 05/14/2007    2007 hx of fosamax use   . TREMOR 07/08/2007  . FREQUENCY, URINARY 08/19/2008  . LUMBAR STRAIN 09/21/2007  . Gastric polyp     panendo  5 2009  . History of ankle fracture   . Parkinson's disease   . Adenomatous colon polyp    Past Surgical History  Procedure Laterality Date  .  Abdominal hysterectomy    . Fracture  2007    left ankle- Hx of fosamax use  . Dilation and curettage of uterus    . Cataract extraction, bilateral    . Colonoscopy N/A 07/19/2013    Procedure: COLONOSCOPY;  Surgeon: Lafayette Dragon, MD;  Location: [facility];  Service: Endoscopy;  Laterality: N/A;   Family History  Problem Relation Age of Onset  . Heart attack Mother   . Coronary artery disease Mother   . Colon cancer Sister     x 2  . Breast cancer Sister     x 4  . Parkinsonism Brother   . Peripheral vascular disease      sibling  . Stroke Father   . Colon cancer Other     nephew  . Colon cancer Paternal Aunt   . Breast cancer Sister   . Breast cancer Sister    History  Substance Use Topics  . Smoking status: Never Smoker   . Smokeless tobacco: Never Used  . Alcohol Use: No   OB History   Grav Para Term Preterm Abortions TAB SAB Ect Mult Living   5 5             Review of Systems  Unable to perform ROS Constitutional: Negative for fever, chills and diaphoresis.  Respiratory: Negative for shortness of breath.   Cardiovascular: Positive for near-syncope. Negative for chest pain, palpitations and leg swelling.  Gastrointestinal: Negative for nausea, vomiting, abdominal pain, diarrhea, constipation and abdominal distention.  Genitourinary: Negative for dysuria, frequency, flank pain  and decreased urine volume.  Neurological: Positive for numbness. Negative for dizziness, tremors, seizures, syncope, facial asymmetry, speech difficulty, weakness, light-headedness and headaches.  All other systems reviewed and are negative.     Allergies  Review of patient's allergies indicates no known allergies.  Home Medications   Prior to Admission medications   Medication Sig Start Date End Date Taking? Authorizing Provider  aspirin 81 MG tablet Take 81 mg by mouth daily.     Yes Historical Provider, MD  carbidopa-levodopa (SINEMET IR) 25-100 MG per tablet Take 0.5-1 tablets  by mouth 3 (three) times daily. Take 1 tablet every morning and every evening and take 0.5 tablet every afternoon   Yes Historical Provider, MD  Cholecalciferol (VITAMIN D3) 1000 UNITS CAPS Take 1,000 Units by mouth daily.    Yes Historical Provider, MD  cyanocobalamin (,VITAMIN B-12,) 1000 MCG/ML injection Inject 1 mL (1,000 mcg total) into the muscle every 30 (thirty) days. 07/05/14  Yes Burnis Medin, MD  fluticasone (FLONASE) 50 MCG/ACT nasal spray Place 2 sprays into both nostrils at bedtime.   Yes Historical Provider, MD  LORazepam (ATIVAN) 0.5 MG tablet Take 0.25-5 mg by mouth 2 (two) times daily as needed for anxiety.   Yes Historical Provider, MD  Magnesium 250 MG TABS Take 250 mg by mouth daily.    Yes Historical Provider, MD  pantoprazole (PROTONIX) 40 MG tablet Take 40 mg by mouth daily.   Yes Historical Provider, MD  Polyethyl Glycol-Propyl Glycol (SYSTANE) 0.4-0.3 % SOLN Apply 1 drop to eye 2 (two) times daily.    Yes Historical Provider, MD  polyethylene glycol (MIRALAX / GLYCOLAX) packet Take 17 g by mouth every other day.    Yes Historical Provider, MD  potassium chloride (K-DUR) 10 MEQ tablet Take 20 mEq by mouth daily.   Yes Historical Provider, MD  propranolol ER (INDERAL LA) 120 MG 24 hr capsule Take 120 mg by mouth daily.   Yes Historical Provider, MD  triamterene-hydrochlorothiazide (MAXZIDE-25) 37.5-25 MG per tablet Take 1 tablet by mouth daily.   Yes Historical Provider, MD   BP 172/60  Pulse 61  Temp(Src) 98.7 F (37.1 C) (Oral)  SpO2 99% Physical Exam  Nursing note and vitals reviewed. Constitutional: She is oriented to person, place, and time. She appears well-developed and well-nourished. No distress.  HENT:  Head: Normocephalic and atraumatic.  Cardiovascular: Normal rate, regular rhythm, normal heart sounds and intact distal pulses.  Exam reveals no gallop and no friction rub.   No murmur heard. Pulmonary/Chest: Effort normal and breath sounds normal. No  respiratory distress. She has no wheezes. She has no rales. She exhibits no tenderness.  Abdominal: Soft. Bowel sounds are normal. She exhibits no distension and no mass. There is no tenderness. There is no rebound and no guarding.  Lymphadenopathy:    She has no cervical adenopathy.  Neurological: She is alert and oriented to person, place, and time. No cranial nerve deficit.  Skin: Skin is warm and dry. She is not diaphoretic.    ED Course  Procedures (including critical care time) Labs Review Labs Reviewed  BASIC METABOLIC PANEL - Abnormal; Notable for the following:    Glucose, Bld 102 (*)    GFR calc non Af Amer 48 (*)    GFR calc Af Amer 55 (*)    All other components within normal limits  CBC WITH DIFFERENTIAL  URINALYSIS, ROUTINE W REFLEX MICROSCOPIC    Imaging Review Ct Head Wo Contrast  07/19/2014  CLINICAL DATA:  Near syncopal episode today.  EXAM: CT HEAD WITHOUT CONTRAST  TECHNIQUE: Contiguous axial images were obtained from the base of the skull through the vertex without intravenous contrast.  COMPARISON:  None.  FINDINGS: Mild diffuse subcortical white matter hypoattenuation is present bilaterally. No acute cortical infarct, hemorrhage, or mass lesion is present. The ventricles are of normal size. No significant extra-axial fluid collection is present.  The paranasal sinuses and mastoid air cells are clear. No significant extracranial soft tissue injury is evident.  IMPRESSION: 1. Mild diffuse white matter disease likely reflects the sequelae of chronic microvascular ischemia. 2. No acute intracranial abnormality. 3. No evidence for acute trauma.   Electronically Signed   By: Lawrence Santiago M.D.   On: 07/19/2014 16:05   Dg Chest Portable 1 View  07/19/2014   CLINICAL DATA:  Numbness.  EXAM: PORTABLE CHEST - 1 VIEW  COMPARISON:  None available.  FINDINGS: The lung volumes are low. The heart size is normal. Mild interstitial coarsening is likely chronic. The visualized soft  tissues and bony thorax are unremarkable.  IMPRESSION: 1. No acute cardiopulmonary disease. 2. Mild interstitial coarsening is likely chronic.   Electronically Signed   By: Lawrence Santiago M.D.   On: 07/19/2014 15:13     EKG Interpretation   Date/Time:  Tuesday July 19 2014 14:19:43 EDT Ventricular Rate:  62 PR Interval:  180 QRS Duration: 97 QT Interval:  431 QTC Calculation: 438 R Axis:   27 Text Interpretation:  Sinus rhythm Baseline wander in lead(s) I II aVR No  old tracing to compare Confirmed by [facility]  MD, TREY ([id number]) on 07/19/2014  3:07:26 PM      MDM   80 yo caucasian F here after syncopal-like episode X2 today. Please see HPI for details. On exam, Pt in NAD, AFVSS. No focal neural deficits. Full strength in all extremities. DDx includes syncope, arrhythmia, seizure, or TIA. Will obtain EKG, CXR, CBC, BMP, UA, and CT head.   Labs wnl. UA w/no sign of UTI. CT head negative for any intracranial abnormalities. CXR wnl. EKG w/no sign of arrhythmia or ischemia.   At this time, pt will be admitted to hospital service for further work up an management. Please see their note for further details regarding the remainder of her hospital course. Throughout her time in the ED, pt remained stable.    Final diagnoses:  Syncope, unspecified syncope type    Pt was seen under the supervision of Dr. Doy Mince.     Sherian Maroon, MD 07/19/14 [place], MD 07/19/14 1723

## 2014-07-20 ENCOUNTER — Inpatient Hospital Stay (HOSPITAL_COMMUNITY): Payer: Medicare Other

## 2014-07-20 DIAGNOSIS — I10 Essential (primary) hypertension: Secondary | ICD-10-CM | POA: Diagnosis not present

## 2014-07-20 DIAGNOSIS — I517 Cardiomegaly: Secondary | ICD-10-CM

## 2014-07-20 DIAGNOSIS — I498 Other specified cardiac arrhythmias: Secondary | ICD-10-CM | POA: Diagnosis not present

## 2014-07-20 DIAGNOSIS — G20 Parkinson's disease: Secondary | ICD-10-CM | POA: Diagnosis not present

## 2014-07-20 DIAGNOSIS — R55 Syncope and collapse: Secondary | ICD-10-CM | POA: Diagnosis not present

## 2014-07-20 LAB — CBC
HEMATOCRIT: 36.7 % (ref 36.0–46.0)
Hemoglobin: 12 g/dL (ref 12.0–15.0)
MCH: 28.2 pg (ref 26.0–34.0)
MCHC: 32.7 g/dL (ref 30.0–36.0)
MCV: 86.4 fL (ref 78.0–100.0)
Platelets: 196 10*3/uL (ref 150–400)
RBC: 4.25 MIL/uL (ref 3.87–5.11)
RDW: 13 % (ref 11.5–15.5)
WBC: 6.5 10*3/uL (ref 4.0–10.5)

## 2014-07-20 LAB — BASIC METABOLIC PANEL
ANION GAP: 13 (ref 5–15)
BUN: 15 mg/dL (ref 6–23)
CO2: 27 meq/L (ref 19–32)
CREATININE: 1.02 mg/dL (ref 0.50–1.10)
Calcium: 9.4 mg/dL (ref 8.4–10.5)
Chloride: 101 mEq/L (ref 96–112)
GFR calc Af Amer: 56 mL/min — ABNORMAL LOW (ref >90)
GFR calc non Af Amer: 48 mL/min — ABNORMAL LOW (ref >90)
Glucose, Bld: 90 mg/dL (ref 70–99)
Potassium: 3.9 mEq/L (ref 3.7–5.3)
SODIUM: 141 meq/L (ref 137–147)

## 2014-07-20 LAB — TROPONIN I

## 2014-07-20 MED ORDER — MAGNESIUM OXIDE 400 (241.3 MG) MG PO TABS
200.0000 mg | ORAL_TABLET | Freq: Every day | ORAL | Status: DC
  Administered 2014-07-21: 200 mg via ORAL
  Filled 2014-07-20: qty 0.5

## 2014-07-20 MED ORDER — HYDRALAZINE HCL 20 MG/ML IJ SOLN
2.0000 mg | Freq: Four times a day (QID) | INTRAMUSCULAR | Status: DC | PRN
  Filled 2014-07-20: qty 1

## 2014-07-20 MED ORDER — GADOBENATE DIMEGLUMINE 529 MG/ML IV SOLN
15.0000 mL | Freq: Once | INTRAVENOUS | Status: AC
  Administered 2014-07-20: 12 mL via INTRAVENOUS

## 2014-07-20 MED FILL — Magnesium Oxide Tab 400 MG (241.3 MG Elemental Mg): ORAL | Qty: 1 | Status: AC

## 2014-07-20 NOTE — Procedures (Signed)
History:  Sedation:   Technique: This is a 17 channel routine scalp EEG performed at the bedside with bipolar and monopolar montages arranged in accordance to the international 10/20 system of electrode placement. One channel was dedicated to EKG recording.    Background: The background consists of intermixed alpha and beta activities. There is a well defined posterior dominant rhythm of 9 Hz that attenuates with eye opening. Sleep structures are recored and are symmetric.   Photic stimulation: Physiologic driving is not performed  EEG Abnormalities: None  Clinical Interpretation: This normal EEG is recorded in the waking and sleep states. There was no seizure or seizure predisposition recorded on this study.   Roland Rack, MD [facility] [phone]  If 7pm- 7am, please page neurology on call as listed in [place].

## 2014-07-20 NOTE — Progress Notes (Signed)
EEG completed; results pending.    

## 2014-07-20 NOTE — Evaluation (Signed)
Physical Therapy Evaluation Patient Details Name: Kimberly Silva MRN: [id number] DOB: 1926-08-10 Today's Date: 07/20/2014   History of Present Illness    Kimberly Silva is a 80 y.o. female with a Past Medical History of Parkinson's disease, hypertension, dyslipidemia who presents to ED with syncope. Patient lives in independent living facility, early around 74 AM while sitting in a chair, she had a "crazy" feeling, she thinks she may have passed out but is not sure if she at least dozed off to sleep. When she came to, she was back to her usual self, she did not notice any tongue bite or urinary or fecal incontinence. Later during the day, while her daughter was with her, she had an episode of lightheadedness which the daughter witnessed and made her sit down on a chair. While sitting down, she had a very brief period "lasting seconds" of shakiness of her arms (more than usual of Parkinson's tremors) and a staring episode. She was then brought to the emergency room for further evaluation.   Clinical Impression  Pt admitted with syncope. Pt with negative orthostatic BP today.  HR somewhat low.  Vestibular testing suggestive of slight right hypofunction.  Initiated x1 exercises with pt.  Pt currently with functional limitations due to the deficits listed below (see PT Problem List). Pt will benefit from skilled PT to increase their independence and safety with mobility to allow discharge to the venue listed below.    Follow Up Recommendations Home health PT;Supervision - Intermittent (for vestibular rehab)    Equipment Recommendations  None recommended by PT    Recommendations for Other Services       Precautions / Restrictions Precautions Precautions: Fall Restrictions Weight Bearing Restrictions: No      Mobility  Bed Mobility Overal bed mobility: Needs Assistance Bed Mobility: Supine to Sit     Supine to sit: Min guard     General bed mobility comments: Tested all canals for  BPPV and negative testing.  Tested for vestibular hypofunction and found positive for right hypofunction.  Inititated x1 exercises.    Transfers Overall transfer level: Needs assistance Equipment used: Rolling walker (2 wheeled) Transfers: Sit to/from Stand Sit to Stand: Min assist         General transfer comment: Needed steadying assist.   Ambulation/Gait Ambulation/Gait assistance: Min assist;+2 safety/equipment Ambulation Distance (Feet): 150 Feet Assistive device: Rolling walker (2 wheeled);None Gait Pattern/deviations: Step-through pattern;Decreased stride length;Trunk flexed;Drifts right/left   Gait velocity interpretation: Below normal speed for age/gender General Gait Details: Pt needed steadying assist especially with head turns.  Generally unsteady without device.  Does well with device and should be able to [facility] to [facility] and get HHPT f/u for vestibular rehab.    Stairs            Wheelchair Mobility    Modified Rankin (Stroke Patients Only)       Balance Overall balance assessment: Needs assistance;History of Falls Sitting-balance support: No upper extremity supported;Feet supported Sitting balance-Leahy Scale: Good     Standing balance support: No upper extremity supported;During functional activity Standing balance-Leahy Scale: Fair Standing balance comment: Needed bil UE support for balance.                   Standardized Balance Assessment Standardized Balance Assessment : Dynamic Gait Index   Dynamic Gait Index Level Surface: Mild Impairment Change in Gait Speed: Mild Impairment Gait with Horizontal Head Turns: Mild Impairment Gait with Vertical Head Turns: Mild Impairment  Gait and Pivot Turn: Moderate Impairment Step Over Obstacle: Moderate Impairment Step Around Obstacles: Mild Impairment Steps: Moderate Impairment Total Score: 13       Pertinent Vitals/Pain Orthostatic BPs  Supine 157/55, 50 bpm  Sitting 162/78, 53  bpm  Standing 170/60, 54 bpm  Standing after 3 min 165/55, 51 bpm   Other VSS, no pain    Home Living Family/patient expects to be discharged to:: Private residence Living Arrangements: Alone Available Help at Discharge: Available PRN/intermittently (nurse gives med, HHPT) Type of Home: Independent living facility Home Access: Level entry     Home Layout: One level Home Equipment: Walker - 4 wheels;Cane - single point;Grab bars - tub/shower;Grab bars - toilet;Tub bench;Hand held shower head (handicapped height toilet)      Prior Function Level of Independence: Independent with assistive device(s)         Comments: RW at [facility], cane if goes out     Hand Dominance   Dominant Hand: Right    Extremity/Trunk Assessment   Upper Extremity Assessment: Defer to OT evaluation           Lower Extremity Assessment: Generalized weakness      Cervical / Trunk Assessment: Normal  Communication   Communication: No difficulties  Cognition Arousal/Alertness: Awake/alert Behavior During Therapy: WFL for tasks assessed/performed Overall Cognitive Status: Within Functional Limits for tasks assessed                      General Comments General comments (skin integrity, edema, etc.): Scored 13/24 on DGI suggesting at risk for falls without device.  Has a RW with seat at home which she uses.      Exercises Other Exercises Other Exercises: Initiated x 1 exercises.  Pt to perform 2 reps, 5x daily.       Assessment/Plan    PT Assessment Patient needs continued PT services  PT Diagnosis Generalized weakness (dizziness)   PT Problem List Decreased activity tolerance;Decreased balance;Decreased mobility;Decreased knowledge of use of DME;Decreased safety awareness;Decreased knowledge of precautions (dizziness)  PT Treatment Interventions DME instruction;Gait training;Functional mobility training;Therapeutic exercise;Therapeutic activities;Balance  training;Patient/family education (gaze stability exercises )   PT Goals (Current goals can be found in the Care Plan section) Acute Rehab PT Goals Patient Stated Goal: to go home PT Goal Formulation: With patient Time For Goal Achievement: 07/27/14 Potential to Achieve Goals: Good    Frequency Min 3X/week   Barriers to discharge Decreased caregiver support      Co-evaluation               End of Session Equipment Utilized During Treatment: Gait belt Activity Tolerance: Patient limited by fatigue Patient left: in chair;with call bell/phone within reach;with family/visitor present Nurse Communication: Mobility status    Functional Assessment Tool Used: clinical judgement Functional Limitation: Mobility: Walking and moving around Mobility: Walking and Moving Around Current Status [phone]): At least 1 percent but less than 20 percent impaired, limited or restricted Mobility: Walking and Moving Around Goal Status [phone]): 0 percent impaired, limited or restricted    Time: 1126-1158 PT Time Calculation (min): 32 min   Charges:   PT Evaluation $Initial PT Evaluation Tier I: 1 Procedure PT Treatments $Gait Training: 8-22 mins $Therapeutic Exercise: 8-22 mins   PT G Codes:   Functional Assessment Tool Used: clinical judgement Functional Limitation: Mobility: Walking and moving around    INGOLD,Kree Rafter 07/20/2014, 1:30 PM [facility] [phone] (pager)

## 2014-07-20 NOTE — ED Provider Notes (Signed)
I saw and evaluated the patient, reviewed the resident's note and I agree with the findings and plan.   EKG Interpretation   Date/Time:  Tuesday July 19 2014 14:19:43 EDT Ventricular Rate:  62 PR Interval:  180 QRS Duration: 97 QT Interval:  431 QTC Calculation: 438 R Axis:   27 Text Interpretation:  Sinus rhythm Baseline wander in lead(s) I II aVR No  old tracing to compare Confirmed by [facility]  MD, TREY [phone]) on 07/19/2014  3:07:26 PM        Houston Siren III, MD 07/20/14 [phone]

## 2014-07-20 NOTE — Progress Notes (Signed)
EKG obtained. Kimberly Silva R

## 2014-07-20 NOTE — Progress Notes (Signed)
Echo Lab  2D Echocardiogram completed.  [place], RDCS 07/20/2014 4:32 PM

## 2014-07-20 NOTE — Progress Notes (Signed)
[facility] PROGRESS NOTE  Kimberly Silva PZW:[id number] DOB: 04/19/1926 DOA: 07/19/2014 PCP: Lottie Dawson, MD  Assessment/Plan: 1. Presyncope: Admitted to telemetry. Over night, serial troponins negative. MRI negative for acute process. EEG negative. Echocardiogram pending.  EKG showed sinus bradycardia. Stopped propranolol.   2. Hypertension: - sub optimally controlled. Resume home medications.  - prn hydralazine.   3. Parkinson's disease: - carbidopa- levodopa.   DVT prophylaxis.    Code Status: full code.  Family Communication: family at bedside.  Disposition Plan: pending.    Consultants:  NONE  Procedures:  Echocardiogram  MRI of the brain.   EEG  Antibiotics: none HPI/Subjective: Denies any new complaints.   Objective: Filed Vitals:   07/20/14 0956  BP:   Pulse: 54  Temp:   Resp:     Intake/Output Summary (Last 24 hours) at 07/20/14 1347 Last data filed at 07/20/14 1026  Gross per 24 hour  Intake    243 ml  Output    450 ml  Net   -207 ml   There were no vitals filed for this visit.  Exam:   General:  Alert afebrile comfortable  Cardiovascular: s1s2  Respiratory: ctab  Abdomen: soft NT nd bs+  Musculoskeletal: no pedal edema.   Data Reviewed: Basic Metabolic Panel:  Recent Labs Lab 07/19/14 1438 07/20/14 0444  NA 138 141  K 4.3 3.9  CL 99 101  CO2 26 27  GLUCOSE 102* 90  BUN 17 15  CREATININE 1.03 1.02  CALCIUM 9.0 9.4   Liver Function Tests: No results found for this basename: AST, ALT, ALKPHOS, BILITOT, PROT, ALBUMIN,  in the last 168 hours No results found for this basename: LIPASE, AMYLASE,  in the last 168 hours No results found for this basename: AMMONIA,  in the last 168 hours CBC:  Recent Labs Lab 07/19/14 1438 07/20/14 0444  WBC 7.6 6.5  NEUTROABS 5.8  --   HGB 12.3 12.0  HCT 38.0 36.7  MCV 86.0 86.4  PLT 205 196   Cardiac Enzymes:  Recent Labs Lab 07/19/14 1749 07/19/14 2344  07/20/14 0444  TROPONINI <0.30 <0.30 <0.30   BNP (last 3 results) No results found for this basename: PROBNP,  in the last 8760 hours CBG: No results found for this basename: GLUCAP,  in the last 168 hours  No results found for this or any previous visit (from the past 240 hour(s)).   Studies: Ct Head Wo Contrast  07/19/2014   CLINICAL DATA:  Near syncopal episode today.  EXAM: CT HEAD WITHOUT CONTRAST  TECHNIQUE: Contiguous axial images were obtained from the base of the skull through the vertex without intravenous contrast.  COMPARISON:  None.  FINDINGS: Mild diffuse subcortical white matter hypoattenuation is present bilaterally. No acute cortical infarct, hemorrhage, or mass lesion is present. The ventricles are of normal size. No significant extra-axial fluid collection is present.  The paranasal sinuses and mastoid air cells are clear. No significant extracranial soft tissue injury is evident.  IMPRESSION: 1. Mild diffuse white matter disease likely reflects the sequelae of chronic microvascular ischemia. 2. No acute intracranial abnormality. 3. No evidence for acute trauma.   Electronically Signed   By: Lawrence Santiago M.D.   On: 07/19/2014 16:05   Mr Jeri Cos UM Contrast  07/20/2014   CLINICAL DATA:  Lightheadedness with periodic inattention. Possible seizure disorder.  EXAM: MRI HEAD WITHOUT AND WITH CONTRAST  TECHNIQUE: Multiplanar, multiecho pulse sequences of the brain and surrounding structures were obtained without  and with intravenous contrast.  CONTRAST:  MultiHance 12 mL.  COMPARISON:  CT head 07/19/2014.  FINDINGS: No evidence for acute infarction, hemorrhage, mass lesion, hydrocephalus, or extra-axial fluid. Normal for age cerebral volume. Mild to moderate subcortical and periventricular T2 and FLAIR hyperintensities, likely chronic microvascular ischemic change. Flow voids are maintained throughout the carotid, basilar, and vertebral arteries. There are no areas of chronic hemorrhage.  Coronal imaging through the temporal lobes demonstrates no inflammatory or neoplastic abnormality.  Post infusion, no abnormal enhancement of the brain or meninges. Visualized calvarium, skull base, and upper cervical osseous structures unremarkable. Scalp and extracranial soft tissues, orbits, sinuses, and mastoids show no acute process.  IMPRESSION: Unremarkable cranial MRI.   Electronically Signed   By: Rolla Flatten M.D.   On: 07/20/2014 08:45   Dg Chest Portable 1 View  07/19/2014   CLINICAL DATA:  Numbness.  EXAM: PORTABLE CHEST - 1 VIEW  COMPARISON:  None available.  FINDINGS: The lung volumes are low. The heart size is normal. Mild interstitial coarsening is likely chronic. The visualized soft tissues and bony thorax are unremarkable.  IMPRESSION: 1. No acute cardiopulmonary disease. 2. Mild interstitial coarsening is likely chronic.   Electronically Signed   By: Lawrence Santiago M.D.   On: 07/19/2014 15:13    Scheduled Meds: . aspirin  81 mg Oral Daily  . carbidopa-levodopa  0.5-1 tablet Oral TID  . cyanocobalamin  1,000 mcg Intramuscular Q30 days  . enoxaparin (LOVENOX) injection  40 mg Subcutaneous Q24H  . fluticasone  2 spray Each Nare QHS  . Magnesium  250 mg Oral Daily  . pantoprazole  40 mg Oral Daily  . polyethylene glycol  17 g Oral QODAY  . polyvinyl alcohol  1 drop Both Eyes BID  . potassium chloride  20 mEq Oral Daily  . sodium chloride  3 mL Intravenous Q12H  . sodium chloride  3 mL Intravenous Q12H  . triamterene-hydrochlorothiazide  1 tablet Oral Daily  . Vitamin D3  1,000 Units Oral Daily   Continuous Infusions:   Active Problems:   HYPERLIPIDEMIA   HYPOKALEMIA   ANEMIA, B12 DEFICIENCY   HYPERTENSION   Parkinsonian tremor   Syncope    Time spent: 30 minutes.     [facility] Pager [phone] If 7PM-7AM, please contact night-coverage at [email or web address], password [facility] 07/20/2014, 1:47 PM  LOS: 1 day

## 2014-07-21 DIAGNOSIS — R55 Syncope and collapse: Secondary | ICD-10-CM | POA: Diagnosis not present

## 2014-07-21 LAB — BASIC METABOLIC PANEL
Anion gap: 12 (ref 5–15)
BUN: 18 mg/dL (ref 6–23)
CHLORIDE: 98 meq/L (ref 96–112)
CO2: 27 meq/L (ref 19–32)
Calcium: 9.7 mg/dL (ref 8.4–10.5)
Creatinine, Ser: 1 mg/dL (ref 0.50–1.10)
GFR calc Af Amer: 57 mL/min — ABNORMAL LOW (ref >90)
GFR calc non Af Amer: 50 mL/min — ABNORMAL LOW (ref >90)
GLUCOSE: 102 mg/dL — AB (ref 70–99)
POTASSIUM: 4.2 meq/L (ref 3.7–5.3)
SODIUM: 137 meq/L (ref 137–147)

## 2014-07-21 MED ORDER — VITAMIN D3 25 MCG (1000 UNIT) PO TABS
1000.0000 [IU] | ORAL_TABLET | Freq: Every day | ORAL | Status: DC
  Administered 2014-07-21: 1000 [IU] via ORAL
  Filled 2014-07-21: qty 1

## 2014-07-21 NOTE — Progress Notes (Signed)
Physical Therapy Treatment and D/C Patient Details Name: Kimberly Silva MRN: [id number] DOB: 1926-12-04 Today's Date: 07/21/2014    History of Present Illness Pt admit with syncope.  Parkinsons history as well.  Appears per testing to have a right vestibular hypofunction as well.     PT Comments    Pt admitted with syncope. Pt currently with functional limitations due to balance and endurance deficits but appears to be close to baseline and is going home with HHPT today.  Pt will benefit from skilled PT in the home to increase their independence and safety with mobility.  D/C PT due to pt d/c today.   Follow Up Recommendations  Home health PT;Supervision - Intermittent (vestibular rehab)     Equipment Recommendations  None recommended by PT    Recommendations for Other Services       Precautions / Restrictions Precautions Precautions: Fall Restrictions Weight Bearing Restrictions: No    Mobility  Bed Mobility Overal bed mobility: Needs Assistance Bed Mobility: Supine to Sit     Supine to sit: Min guard        Transfers Overall transfer level: Needs assistance   Transfers: Sit to/from Stand Sit to Stand: Min guard         General transfer comment: No steadying assist needed.   Ambulation/Gait Ambulation/Gait assistance: Supervision;Min guard Ambulation Distance (Feet): 250 Feet Assistive device: None Gait Pattern/deviations: Step-through pattern;Decreased stride length;Narrow base of support;Trunk flexed   Gait velocity interpretation: Below normal speed for age/gender General Gait Details: Pt with improved balance.  Much steadier even without device so with her RW she will do fine at [facility].     Stairs            Wheelchair Mobility    Modified Rankin (Stroke Patients Only)       Balance Overall balance assessment: Needs assistance         Standing balance support: No upper extremity supported;During functional activity Standing  balance-Leahy Scale: Fair Standing balance comment: Can stand and wash hands without UE support.             High level balance activites: Direction changes;Turns;Sudden stops;Head turns High Level Balance Comments: Can do all of above and self correct balance.      Cognition Arousal/Alertness: Awake/alert Behavior During Therapy: WFL for tasks assessed/performed Overall Cognitive Status: Within Functional Limits for tasks assessed                      Exercises Other Exercises Other Exercises: Reviewed x1 exercises.  Pt states they make her dizzier.  Explained that that is how she can retrain her system.  Pt states her doctor told her moving head can make her Parkinson's worse.  She states she will do the exercises only if she feels like she is spinning.  Otherwise she prefers not to do them.  Daughter present and pt and daughter understand that doing the exercises will make pt dizzy but that it will get better as she practices.  They were also given a handout of the progression to include standing and daughter understands that pt must be guarded to do these.  Pt performed one rep of the exercise in standing today for 10 seconds and said she was dizzy and would not continue.  Instructed pt and daughter that if she really feels that she cannot do the exercises, she can take Meclizine or Antivert and that it will suppress the system so she is not  dizzy.  Explained the dangers of the medication as it means that she is not making her inner ear system stronger if she is not doing the exercises and just taking the medication and that it can make it weaker.  Pt and daughter understand.      General Comments        Pertinent Vitals/Pain VSS, No pain    Home Living                      Prior Function            PT Goals (current goals can now be found in the care plan section) Progress towards PT goals: Progressing toward goals    Frequency  Min 3X/week    PT Plan  Current plan remains appropriate    Co-evaluation             End of Session Equipment Utilized During Treatment: Gait belt Activity Tolerance: Patient tolerated treatment well Patient left: in chair;with call bell/phone within reach;with family/visitor present     Time: 7169-6789 PT Time Calculation (min): 19 min  Charges:  $Gait Training: 8-22 mins                    G Codes:  Functional Assessment Tool Used: clinical judgement Functional Limitation: Mobility: Walking and moving around Mobility: Walking and Moving Around Goal Status [phone]): 0 percent impaired, limited or restricted Mobility: Walking and Moving Around Discharge Status [phone]): At least 1 percent but less than 20 percent impaired, limited or restricted   INGOLD,Jaben Benegas 07/21/2014, 11:58 AM  [facility] [phone] (pager)

## 2014-07-21 NOTE — Progress Notes (Signed)
Patient discharged to home with daughter. Telemetry and IV D/C'd. Discharge instructions reviewed with patient and daughter. Patient states she has no questions about discharge instructions. Patient transported to discharge via wheelchair by NT.

## 2014-07-21 NOTE — Care Management Note (Unsigned)
    Page 1 of 1   07/21/2014     1:44:46 PM CARE MANAGEMENT NOTE 07/21/2014  Patient:  Kimberly Silva, Kimberly Silva   Account Number:  [id number]  Date Initiated:  07/21/2014  Documentation initiated by:  Marranda Arakelian  Subjective/Objective Assessment:   Pt adm on 07/19/14 with presyncope.  PTA, pt resides at home alone at [place].     Action/Plan:   Will follow for dc needs as pt progresses.  PT recommending HHPT at dc.   Anticipated DC Date:  07/21/2014   Anticipated DC Plan:  [place]  CM consult      Choice offered to / List presented to:             Status of service:  In process, will continue to follow Medicare Important Message given?   (If response is "NO", the following Medicare IM given date fields will be blank) Date Medicare IM given:   Medicare IM given by:   Date Additional Medicare IM given:   Additional Medicare IM given by:    Discharge Disposition:    Per UR Regulation:  Reviewed for med. necessity/level of care/duration of stay  If discussed at [place] of Stay Meetings, dates discussed:    Comments:

## 2014-07-21 NOTE — Discharge Summary (Signed)
Physician Discharge Summary  Kimberly Silva VOJ:[id number] DOB: 02-Nov-1926 DOA: 07/19/2014  PCP: Lottie Dawson, MD  Admit date: 07/19/2014 Discharge date: 07/21/2014  Time spent: 30 minutes  Recommendations for Outpatient Follow-up:  1. You will need holter/ event monitor placed and follow up with cardiology as recommended 2. Follow up with PCP in one week.   Discharge Diagnoses:  Active Problems:   HYPERLIPIDEMIA   HYPOKALEMIA   ANEMIA, B12 DEFICIENCY   HYPERTENSION   Parkinsonian tremor   Syncope   Discharge Condition: improved.    Diet recommendation: low sodium diet.   There were no vitals filed for this visit.  History of present illness:  Kimberly Silva is a 80 y.o. female with a Past Medical History of Parkinson's disease, hypertension, dyslipidemia  presents today with lightheadedness and staring into space. She was admitted to telemetry.    Hospital Course:  1. Presyncope: probably secondary to orthostatic hypotension.  Admitted to telemetry. Over night, serial troponins negative. MRI negative for acute process. EEG negative. Echocardiogram showed mild  Grade 1 diastolic dysfunction.  EKG showed sinus bradycardia. Stopped propranolol. Recommended cardiology consultation. Referred to outpatient holter  Event monitor and follow up with cardiology as outpatient.  2. Hypertension:   controlled. Resume home medications.    3. Parkinson's disease:  - carbidopa- levodopa.      Procedures:  Echo  MRI brain  eeg.  Consultations:  none  Discharge Exam: Filed Vitals:   07/21/14 0617  BP: 152/56  Pulse: 57  Temp: 98 F (36.7 C)  Resp: 20    General: alert afebrile comfortable Cardiovascular: s1s2 Respiratory: ctab  Discharge Instructions You were cared for by a hospitalist during your hospital stay. If you have any questions about your discharge medications or the care you received while you were in the hospital after you are discharged, you  can call the unit and asked to speak with the hospitalist on call if the hospitalist that took care of you is not available. Once you are discharged, your primary care physician will handle any further medical issues. Please note that NO REFILLS for any discharge medications will be authorized once you are discharged, as it is imperative that you return to your primary care physician (or establish a relationship with a primary care physician if you do not have one) for your aftercare needs so that they can reassess your need for medications and monitor your lab values.  Discharge Instructions   Diet - low sodium heart healthy    Complete by:  As directed      Discharge instructions    Complete by:  As directed   Follow up with PCP in one week Follow up with cardiology as recommended.            Medication List    STOP taking these medications       propranolol ER 120 MG 24 hr capsule  Commonly known as:  INDERAL LA      TAKE these medications       aspirin 81 MG tablet  Take 81 mg by mouth daily.     carbidopa-levodopa 25-100 MG per tablet  Commonly known as:  SINEMET IR  Take 0.5-1 tablets by mouth 3 (three) times daily. Take 1 tablet every morning and every evening and take 0.5 tablet every afternoon     cyanocobalamin 1000 MCG/ML injection  Commonly known as:  (VITAMIN B-12)  Inject 1 mL (1,000 mcg total) into the muscle every 30 (thirty)  days.     fluticasone 50 MCG/ACT nasal spray  Commonly known as:  FLONASE  Place 2 sprays into both nostrils at bedtime.     LORazepam 0.5 MG tablet  Commonly known as:  ATIVAN  Take 0.25-5 mg by mouth 2 (two) times daily as needed for anxiety.     Magnesium 250 MG Tabs  Take 250 mg by mouth daily.     pantoprazole 40 MG tablet  Commonly known as:  PROTONIX  Take 40 mg by mouth daily.     polyethylene glycol packet  Commonly known as:  MIRALAX / GLYCOLAX  Take 17 g by mouth every other day.     potassium chloride 10 MEQ tablet   Commonly known as:  K-DUR  Take 20 mEq by mouth daily.     SYSTANE 0.4-0.3 % Soln  Generic drug:  Polyethyl Glycol-Propyl Glycol  Apply 1 drop to eye 2 (two) times daily.     triamterene-hydrochlorothiazide 37.5-25 MG per tablet  Commonly known as:  MAXZIDE-25  Take 1 tablet by mouth daily.     Vitamin D3 1000 UNITS Caps  Take 1,000 Units by mouth daily.       No Known Allergies     Follow-up Information   Follow up with Sinclair Grooms, MD On 09/19/2014. (AT 10 30 AM ,.)    Specialty:  Cardiology   Contact information:   [address] [place] [address] [phone]       Follow up with Lottie Dawson, MD. Schedule an appointment as soon as possible for a visit in 1 week.   Specialties:  Internal Medicine, Pediatrics   Contact information:   [place] [address] [phone]        The results of significant diagnostics from this hospitalization (including imaging, microbiology, ancillary and laboratory) are listed below for reference.    Significant Diagnostic Studies: Ct Head Wo Contrast  07/19/2014   CLINICAL DATA:  Near syncopal episode today.  EXAM: CT HEAD WITHOUT CONTRAST  TECHNIQUE: Contiguous axial images were obtained from the base of the skull through the vertex without intravenous contrast.  COMPARISON:  None.  FINDINGS: Mild diffuse subcortical white matter hypoattenuation is present bilaterally. No acute cortical infarct, hemorrhage, or mass lesion is present. The ventricles are of normal size. No significant extra-axial fluid collection is present.  The paranasal sinuses and mastoid air cells are clear. No significant extracranial soft tissue injury is evident.  IMPRESSION: 1. Mild diffuse white matter disease likely reflects the sequelae of chronic microvascular ischemia. 2. No acute intracranial abnormality. 3. No evidence for acute trauma.   Electronically Signed   By: Lawrence Santiago M.D.   On: 07/19/2014  16:05   Mr Kimberly Silva PI Contrast  07/20/2014   CLINICAL DATA:  Lightheadedness with periodic inattention. Possible seizure disorder.  EXAM: MRI HEAD WITHOUT AND WITH CONTRAST  TECHNIQUE: Multiplanar, multiecho pulse sequences of the brain and surrounding structures were obtained without and with intravenous contrast.  CONTRAST:  MultiHance 12 mL.  COMPARISON:  CT head 07/19/2014.  FINDINGS: No evidence for acute infarction, hemorrhage, mass lesion, hydrocephalus, or extra-axial fluid. Normal for age cerebral volume. Mild to moderate subcortical and periventricular T2 and FLAIR hyperintensities, likely chronic microvascular ischemic change. Flow voids are maintained throughout the carotid, basilar, and vertebral arteries. There are no areas of chronic hemorrhage. Coronal imaging through the temporal lobes demonstrates no inflammatory or neoplastic abnormality.  Post infusion, no abnormal enhancement  of the brain or meninges. Visualized calvarium, skull base, and upper cervical osseous structures unremarkable. Scalp and extracranial soft tissues, orbits, sinuses, and mastoids show no acute process.  IMPRESSION: Unremarkable cranial MRI.   Electronically Signed   By: Rolla Flatten M.D.   On: 07/20/2014 08:45   Dg Chest Portable 1 View  07/19/2014   CLINICAL DATA:  Numbness.  EXAM: PORTABLE CHEST - 1 VIEW  COMPARISON:  None available.  FINDINGS: The lung volumes are low. The heart size is normal. Mild interstitial coarsening is likely chronic. The visualized soft tissues and bony thorax are unremarkable.  IMPRESSION: 1. No acute cardiopulmonary disease. 2. Mild interstitial coarsening is likely chronic.   Electronically Signed   By: Lawrence Santiago M.D.   On: 07/19/2014 15:13    Microbiology: No results found for this or any previous visit (from the past 240 hour(s)).   Labs: Basic Metabolic Panel:  Recent Labs Lab 07/19/14 1438 07/20/14 0444 07/21/14 1106  NA 138 141 137  K 4.3 3.9 4.2  CL 99 101 98   CO2 26 27 27   GLUCOSE 102* 90 102*  BUN 17 15 18   CREATININE 1.03 1.02 1.00  CALCIUM 9.0 9.4 9.7   Liver Function Tests: No results found for this basename: AST, ALT, ALKPHOS, BILITOT, PROT, ALBUMIN,  in the last 168 hours No results found for this basename: LIPASE, AMYLASE,  in the last 168 hours No results found for this basename: AMMONIA,  in the last 168 hours CBC:  Recent Labs Lab 07/19/14 1438 07/20/14 0444  WBC 7.6 6.5  NEUTROABS 5.8  --   HGB 12.3 12.0  HCT 38.0 36.7  MCV 86.0 86.4  PLT 205 196   Cardiac Enzymes:  Recent Labs Lab 07/19/14 1749 07/19/14 2344 07/20/14 0444  TROPONINI <0.30 <0.30 <0.30   BNP: BNP (last 3 results) No results found for this basename: PROBNP,  in the last 8760 hours CBG: No results found for this basename: GLUCAP,  in the last 168 hours     Signed:  Mikale Silversmith  [facility] 07/21/2014, 12:57 PM

## 2014-07-22 ENCOUNTER — Encounter: Payer: Self-pay | Admitting: Internal Medicine

## 2014-07-22 ENCOUNTER — Other Ambulatory Visit: Payer: Self-pay

## 2014-07-22 DIAGNOSIS — R55 Syncope and collapse: Secondary | ICD-10-CM

## 2014-07-25 ENCOUNTER — Telehealth: Payer: Self-pay | Admitting: Internal Medicine

## 2014-07-25 ENCOUNTER — Encounter: Payer: Self-pay | Admitting: Family Medicine

## 2014-07-25 ENCOUNTER — Ambulatory Visit (INDEPENDENT_AMBULATORY_CARE_PROVIDER_SITE_OTHER): Payer: Medicare Other | Admitting: Family Medicine

## 2014-07-25 VITALS — BP 157/83 | HR 123 | Temp 98.9°F | Ht 60.0 in | Wt 126.0 lb

## 2014-07-25 DIAGNOSIS — I10 Essential (primary) hypertension: Secondary | ICD-10-CM

## 2014-07-25 DIAGNOSIS — R55 Syncope and collapse: Secondary | ICD-10-CM

## 2014-07-25 DIAGNOSIS — G20 Parkinson's disease: Secondary | ICD-10-CM

## 2014-07-25 MED ORDER — CARVEDILOL 6.25 MG PO TABS: 6.2500 mg | ORAL_TABLET | Freq: Two times a day (BID) | ORAL | Status: DC

## 2014-07-25 NOTE — Progress Notes (Signed)
   Subjective:    Patient ID: Kimberly Silva, female    DOB: [birth year], 80 y.o.   MRN: [id number]  HPI Here to follow up a hospital stay from 07-19-14 to 07-21-14 for syncope and hypotension. After testing it was determined that she was simply overmedicated with Propranolol and she was told to stop it. Since going home her BP has been high and her pulse rate has been too fast. She feels well except for some shakiness and some anxiety. No HA or chest pain or SOB. She is scheduled to see Dr. Regis Bill on 08-03-14 and then Dr. Daneen Schick on 09-19-14.    Review of Systems  Constitutional: Negative.   Respiratory: Negative.   Cardiovascular: Negative.   Neurological: Negative.   Psychiatric/Behavioral: The patient is nervous/anxious.        Objective:   Physical Exam  Constitutional: She is oriented to person, place, and time. She appears well-developed and well-nourished. No distress.  Cardiovascular: Regular rhythm, normal heart sounds and intact distal pulses.  Exam reveals no gallop and no friction rub.   No murmur heard. Rapid rate   Pulmonary/Chest: Effort normal. No respiratory distress. She has no wheezes. She has no rales.  Neurological: She is alert and oriented to person, place, and time.  She has resting tremors in both hands           Assessment & Plan:  We will replace her beta blocker to help with heart rate, BP, and her tremors. Try Carvedilol 6.25 mg bid, since she will probably tolerate this better. See Dr. Regis Bill as above to assess her dosing.

## 2014-07-25 NOTE — Telephone Encounter (Signed)
Patient Information:  Caller Name: Lesleigh Noe  Phone: [phone]  Patient: Kimberly Silva, Kimberly Silva  Gender: Female  DOB: 1926-08-14  Age: 80 Years  PCP: Shanon Ace (Family Practice)  Office Follow Up:  Does the office need to follow up with this patient?: No  Instructions For The Office: N/A   Symptoms  Reason For Call & Symptoms: Pt was admitted into the hospital last week for HTN; Inderal was disconnected because heart rate was very low; will see Dr Regis Bill on 08/05/13; pt requesting an earlier appt because she is nervous not being on Inderol; daughter is calling who was a Marine scientist in [place] office; she is not with pt now and doesn't know how pt is doing today; attempted to reach pt but no answer;  daughter is requesting appt after 4:00pm today due to work schedule; explained to daughter that we can get her in today with Dr Sarajane Jews at 4:15pm but we need to make sure that pt is stable to wait until later today to be seen; daughter will try and reach pt and have her call us to evaluate her and see if she is having any sx at present time and if pt would need to be seen earlier today- Unable to triage but appt made per daughter request  due to change in medication from hospital visit.  Reviewed Health History In EMR: Yes  Reviewed Medications In EMR: Yes  Reviewed Allergies In EMR: Yes  Reviewed Surgeries / Procedures: Yes  Date of Onset of Symptoms: Unknown  Guideline(s) Used:  No Protocol Available - Information Only  Disposition Per Guideline:   Home Care  Reason For Disposition Reached:   Information only question and nurse able to answer  Advice Given:  Call Back If:  New symptoms develop  You become worse.  RN Overrode Recommendation:  Make Appointment  Appt made for 4:15pm today Dr Sarajane Jews  Appointment Scheduled:  07/25/2014 16:15:00 Appointment Scheduled Provider:  Alysia Penna [facility])

## 2014-07-25 NOTE — Telephone Encounter (Signed)
We will see her today

## 2014-07-25 NOTE — Telephone Encounter (Signed)
Patient Information:  Caller Name: Renuka  Phone: [phone]  Patient: Genella, Bas  Gender: Female  DOB: 08-29-1926  Age: 80 Years  PCP: Shanon Ace (Family Practice)  Office Follow Up:  Does the office need to follow up with this patient?: No  Instructions For The Office: N/A   Symptoms  Reason For Call & Symptoms: Call #2 Follow Up Call: Pt is calling back and states that she is anxious to see MD due to stopping the Inderol; and would like to see Dr  Sarajane Jews; denies any symptoms; she was just evaluated by nurse at  her living facility and her BP was 170/70 pulse 74 this morning; about to get up and move around today;  denies anything abnormal than how she normally is for her self;  pt will keep appt for 4:15pm today with Dr Sarajane Jews that was previously scheduled this morning by her daughter- triage RN requested for pt to call back to evaluate her sx today since she was not with daughter at the time of the call and pt was not able to be reached  Reviewed Health History In EMR: Yes  Reviewed Medications In EMR: Yes  Reviewed Allergies In EMR: Yes  Reviewed Surgeries / Procedures: Yes  Date of Onset of Symptoms: Unknown  Guideline(s) Used:  No Protocol Available - Information Only  Disposition Per Guideline:   Home Care  Reason For Disposition Reached:   Information only question and nurse able to answer  Advice Given:  N/A  Patient Will Follow Care Advice:  YES

## 2014-07-25 NOTE — Telephone Encounter (Signed)
This is a patient of Dr. Regis Bill.  On your schedule today.  Please review.  Should patient be rescheduled?

## 2014-07-25 NOTE — Telephone Encounter (Signed)
Please contact the patient and see how she feels today

## 2014-07-25 NOTE — Progress Notes (Signed)
Pre visit review using our clinic review tool, if applicable. No additional management support is needed unless otherwise documented below in the visit note. 

## 2014-07-25 NOTE — Telephone Encounter (Signed)
Spoke to the pt.  This morning her bp was 170/70.  Stopped taking the Inderal on Wednesday.  She feels very weak and shaky. Has been taking triamterene-hydrochlorothiazide (MAXZIDE-25) 37.5-25 MG per tablet only.  Please advise.  Thanks!

## 2014-08-03 ENCOUNTER — Encounter: Payer: Self-pay | Admitting: Internal Medicine

## 2014-08-03 ENCOUNTER — Ambulatory Visit (INDEPENDENT_AMBULATORY_CARE_PROVIDER_SITE_OTHER): Payer: Medicare Other | Admitting: Internal Medicine

## 2014-08-03 VITALS — BP 154/70 | HR 80 | Temp 97.9°F | Ht 60.25 in | Wt 132.0 lb

## 2014-08-03 DIAGNOSIS — Z09 Encounter for follow-up examination after completed treatment for conditions other than malignant neoplasm: Secondary | ICD-10-CM | POA: Insufficient documentation

## 2014-08-03 DIAGNOSIS — R55 Syncope and collapse: Secondary | ICD-10-CM

## 2014-08-03 DIAGNOSIS — I10 Essential (primary) hypertension: Secondary | ICD-10-CM

## 2014-08-03 DIAGNOSIS — G20 Parkinson's disease: Secondary | ICD-10-CM

## 2014-08-03 DIAGNOSIS — F4322 Adjustment disorder with anxiety: Secondary | ICD-10-CM

## 2014-08-03 DIAGNOSIS — Z79899 Other long term (current) drug therapy: Secondary | ICD-10-CM | POA: Insufficient documentation

## 2014-08-03 MED ORDER — LORAZEPAM 0.5 MG PO TABS: 0.2500 mg | ORAL_TABLET | Freq: Two times a day (BID) | ORAL | Status: DC | PRN

## 2014-08-03 NOTE — Progress Notes (Signed)
Pre visit review using our clinic review tool, if applicable. No additional management support is needed unless otherwise documented below in the visit note.  Chief Complaint  Patient presents with  . Follow-up    HPI: Kimberly [facility] from  8 4-8-6 for pres syncope felt from orthostatic hypotension r/o tia cns event  Poss badycardia causes . She comes with daughter today  See below :  Since discharge  She saw dr Sarajane Jews in my absence because she was discharged from hospital  with no substitute for the  Propranolol  That was stopped because of poss bracycardia sx  . He began carvedilol  instead of propranol , tremor is slightly worse  Harder to  write in the afternoon .   Anxiety some taking whole ativan pill in am as advised  Doing ok   History of present illness:  Kimberly Silva is a 80 y.o. female with a Past Medical History of Parkinson's disease, hypertension, dyslipidemia presents today with lightheadedness and staring into space. She was admitted to telemetry.  Hospital Course:  1. Presyncope: probably secondary to orthostatic hypotension.  Admitted to telemetry. Over night, serial troponins negative. MRI negative for acute process. EEG negative. Echocardiogram showed mild Grade 1 diastolic dysfunction.  EKG showed sinus bradycardia. Stopped propranolol. Recommended cardiology consultation. Referred to outpatient holter Event monitor and follow up with cardiology as outpatient.  2. Hypertension:  controlled. Resume home medications.  3. Parkinson's disease:  - carbidopa- levodopa.    Procedures:  Echo  MRI brain  eeg. Consultations:  none Discharge Exam:  Filed Vitals:    07/21/14 0617   BP:  152/56   Pulse:  57   Temp:  98 F (36.7 C)   Resp:  20   General: alert afebrile comfortable  Cardiovascular: s1s2  Respiratory: ctab Recommendations for Outpatient Follow-up:  2. You will need holter/ event monitor placed and follow up with cardiology as  recommended 3. Follow up with PCP in one week.  4.  ROS: See pertinent positives and negatives per HPI.  Past Medical History  Diagnosis Date  . HYPERLIPIDEMIA 02/21/2009  . HYPOKALEMIA 08/17/2007  . ANEMIA, B12 DEFICIENCY 03/13/2009  . HYPERTENSION 05/14/2007  . Blood in stool 01/25/2008  . OSTEOARTHRITIS 05/14/2007  . OSTEOPENIA 05/14/2007    2007 hx of fosamax use   . TREMOR 07/08/2007  . FREQUENCY, URINARY 08/19/2008  . LUMBAR STRAIN 09/21/2007  . Gastric polyp     panendo  5 2009  . History of ankle fracture   . Parkinson's disease   . Adenomatous colon polyp     Family History  Problem Relation Age of Onset  . Heart attack Mother   . Coronary artery disease Mother   . Colon cancer Sister     x 2  . Breast cancer Sister     x 4  . Parkinsonism Brother   . Peripheral vascular disease      sibling  . Stroke Father   . Colon cancer Other     nephew  . Colon cancer Paternal Aunt   . Breast cancer Sister   . Breast cancer Sister     History   Social History  . Marital Status: Widowed    Spouse Name: N/A    Number of Children: 50  . Years of Education: BA   Occupational History  . Retired     Pharmacist, hospital   Social History Main Topics  . Smoking status: Never Smoker   .  Smokeless tobacco: Never Used  . Alcohol Use: No  . Drug Use: No  . Sexual Activity: None   Other Topics Concern  . None   Social History Narrative   Husband died in [place] in 18-Mar-2008 of [facility] assisted living [facility].   [facility]   G7P5   Caffeine Use: 1 soda daily    Outpatient Encounter Prescriptions as of 08/03/2014  Medication Sig  . aspirin 81 MG tablet Take 81 mg by mouth daily.    . carbidopa-levodopa (SINEMET IR) 25-100 MG per tablet Take 0.5-1 tablets by mouth 3 (three) times daily.   . carvedilol (COREG) 6.25 MG tablet Take 1 tablet (6.25 mg total) by mouth 2 (two) times daily with a meal.  . Cholecalciferol (VITAMIN D3) 1000 UNITS CAPS Take 1,000 Units by  mouth daily.   . cyanocobalamin (,VITAMIN B-12,) 1000 MCG/ML injection Inject 1 mL (1,000 mcg total) into the muscle every 30 (thirty) days.  . fluticasone (FLONASE) 50 MCG/ACT nasal spray Place 2 sprays into both nostrils at bedtime.  Marland Kitchen LORazepam (ATIVAN) 0.5 MG tablet Take 0.5-10 tablets (0.25-5 mg total) by mouth 2 (two) times daily as needed for anxiety.  . Magnesium 250 MG TABS Take 250 mg by mouth daily.   . pantoprazole (PROTONIX) 40 MG tablet Take 40 mg by mouth daily.  [name] Glycol-Propyl Glycol (SYSTANE) 0.4-0.3 % SOLN Apply 1 drop to eye 2 (two) times daily.   . polyethylene glycol (MIRALAX / GLYCOLAX) packet Take 17 g by mouth every other day.   . potassium chloride (K-DUR) 10 MEQ tablet Take 10 mEq by mouth 2 (two) times daily.   Marland Kitchen triamterene-hydrochlorothiazide (MAXZIDE-25) 37.5-25 MG per tablet Take 1 tablet by mouth daily.  . [DISCONTINUED] LORazepam (ATIVAN) 0.5 MG tablet Take 0.25-5 mg by mouth 2 (two) times daily as needed for anxiety.  . [DISCONTINUED] propranolol ER (INDERAL LA) 120 MG 24 hr capsule     EXAM:  BP 154/70  Pulse 80  Temp(Src) 97.9 F (36.6 C) (Oral)  Ht 5' 0.25" (1.53 m)  Wt 132 lb (59.875 kg)  BMI 25.58 kg/m2  SpO2 98%  Body mass index is 25.58 kg/(m^2).  GENERAL: vitals reviewed and listed above, alert, oriented, appears well hydrated and in no acute distress righ hand tremor at times   Gait independent   mildy unsteady  Careful  HEENT: atraumatic, conjunctiva  clear, no obvious abnormalities on inspection of external nose and ears NECK: no obvious masses on inspection palpation  LUNGS: clear to auscultation bilaterally, no wheezes, rales or rhonchi, good air movement CV: HRRR, no clubbing cyanosis or  peripheral edema nl cap refill  MS: moves all extremities  Neuro right hand tremor off an on at rest PSYCH: pleasant and cooperative, no obvious depression or anxiety Lab Results  Component Value Date   WBC 6.5 07/20/2014   HGB 12.0  07/20/2014   HCT 36.7 07/20/2014   PLT 196 07/20/2014   GLUCOSE 102* 07/21/2014   CHOL 199 04/24/2012   TRIG 122.0 04/24/2012   HDL 69.30 04/24/2012   LDLDIRECT 101.1 04/16/2011   LDLCALC 105* 04/24/2012   ALT 3 03/24/2014   AST 12 03/24/2014   NA 137 07/21/2014   K 4.2 07/21/2014   CL 98 07/21/2014   CREATININE 1.00 07/21/2014   BUN 18 07/21/2014   CO2 27 07/21/2014   TSH 0.81 01/05/2014    ASSESSMENT AND PLAN:  Discussed the following assessment and plan:  Syncope, unspecified syncope type  Unspecified essential hypertension  Hospital discharge follow-up  Parkinsonian tremor - some inc tremor off propranol med   Adjustment disorder with anxious mood  Medication management Patient reports blood pressure at home more in the 130/140 range.    Pulse ok will get more readings stay on meds for now  Still minimal change of brady but  Pulse  Not low today. -Patient advised to return or notify health care team  if symptoms worsen ,persist or new concerns arise.  Patient Instructions  Continue the  Carvedilol .  Check blood pressure . Readings   And if under 150 continue . Proceed   With the  Event monitor as discussed .  Ativan once a day ok and can try off  Of 1/2 as needed.  Glad you appetite is better . Will send  Message to Dr Mamie Nick to see if needs to see  You earlier  If having issues with medication.in the interim.    Standley Brooking. Antoniette Peake M.D.

## 2014-08-03 NOTE — Patient Instructions (Addendum)
Continue the  Carvedilol .  Check blood pressure . Readings   And if under 150 continue . Proceed   With the  Event monitor as discussed .  Ativan once a day ok and can try off  Of 1/2 as needed.  Glad you appetite is better . Will send  Message to Dr Mamie Nick to see if needs to see  You earlier  If having issues with medication.in the interim.

## 2014-08-04 ENCOUNTER — Encounter: Payer: Self-pay | Admitting: Internal Medicine

## 2014-08-05 ENCOUNTER — Ambulatory Visit: Payer: Medicare Other | Admitting: Internal Medicine

## 2014-08-08 ENCOUNTER — Encounter: Payer: Self-pay | Admitting: Internal Medicine

## 2014-08-08 ENCOUNTER — Ambulatory Visit: Payer: Medicare Other | Admitting: Internal Medicine

## 2014-08-09 ENCOUNTER — Encounter: Payer: Self-pay | Admitting: *Deleted

## 2014-08-09 ENCOUNTER — Encounter (INDEPENDENT_AMBULATORY_CARE_PROVIDER_SITE_OTHER): Payer: Medicare Other

## 2014-08-09 DIAGNOSIS — R55 Syncope and collapse: Secondary | ICD-10-CM

## 2014-08-09 NOTE — Progress Notes (Signed)
Patient ID: Kimberly Silva, female   DOB: 02-16-1926, 80 y.o.   MRN: [id number] E-cardio verite 30 day cardiac event monitor applied to patient.

## 2014-08-17 ENCOUNTER — Ambulatory Visit (INDEPENDENT_AMBULATORY_CARE_PROVIDER_SITE_OTHER): Payer: Medicare Other | Admitting: Internal Medicine

## 2014-08-17 ENCOUNTER — Encounter: Payer: Self-pay | Admitting: Internal Medicine

## 2014-08-17 VITALS — BP 180/86 | HR 75 | Temp 98.2°F | Ht 60.25 in | Wt 127.1 lb

## 2014-08-17 DIAGNOSIS — J22 Unspecified acute lower respiratory infection: Secondary | ICD-10-CM

## 2014-08-17 DIAGNOSIS — J988 Other specified respiratory disorders: Secondary | ICD-10-CM

## 2014-08-17 NOTE — Patient Instructions (Addendum)
This acts like a viral respiratory infection.  Chest exam is normal . Rest and fluids . Tylenol for body aches and fever.  Have nurse  Get vital signs   And temperature . And if fever recurs contact for recheck. Contact us Friday morning or any time about status  And how  You are doing . There is a Saturday am clinic if call the oncall service and need to be seen.   You may begin to have a cough before getting better.   BP readings seem to be ok except today .

## 2014-08-17 NOTE — Progress Notes (Signed)
Pre visit review using our clinic review tool, if applicable. No additional management support is needed unless otherwise documented below in the visit note.  Chief Complaint  Patient presents with  . Cough    Started Saturday.  Cough is mostly productive in the evenings.  . Sore Throat  . Fever  . Nasal Congestion  . Nausea    HPI: Patient Kimberly Silva  80 y.o. comes in today for SDA for  new problem evaluation. Here with daughter  Acute onset Onset 2 -3 days ago of fever over 100 profuse  runny nose  No shaking chills and malaise   No cough but feels like may get one. Had sore throat scrathcy  99 Tthis am .  Now on the vent monitor holter apparatus    No cp sob breathing ok.  ROS: See pertinent positives and negatives per HPI.bp readings reviewed  135rand x 3 140s x 2 150 x 2 168 x 1 8 26   Today 119 and 129  Pulse 70s.   Past Medical History  Diagnosis Date  . HYPERLIPIDEMIA 02/21/2009  . HYPOKALEMIA 08/17/2007  . ANEMIA, B12 DEFICIENCY 03/13/2009  . HYPERTENSION 05/14/2007  . Blood in stool 01/25/2008  . OSTEOARTHRITIS 05/14/2007  . OSTEOPENIA 05/14/2007    03-29-06 hx of fosamax use   . TREMOR 07/08/2007  . FREQUENCY, URINARY 08/19/2008  . LUMBAR STRAIN 09/21/2007  . Gastric polyp     panendo  5 03-29-2008  . History of ankle fracture   . Parkinson's disease   . Adenomatous colon polyp     Family History  Problem Relation Age of Onset  . Heart attack Mother   . Coronary artery disease Mother   . Colon cancer Sister     x 2  . Breast cancer Sister     x 4  . Parkinsonism Brother   . Peripheral vascular disease      sibling  . Stroke Father   . Colon cancer Other     nephew  . Colon cancer Paternal Aunt   . Breast cancer Sister   . Breast cancer Sister     History   Social History  . Marital Status: Widowed    Spouse Name: N/A    Number of Children: 24  . Years of Education: BA   Occupational History  . Retired     Pharmacist, hospital   Social History Main Topics  . Smoking  status: Never Smoker   . Smokeless tobacco: Never Used  . Alcohol Use: No  . Drug Use: No  . Sexual Activity: None   Other Topics Concern  . None   Social History Narrative   Husband died in [place] in 03/29/08 of [facility].   [facility]   G7P5   Caffeine Use: 1 soda daily    Outpatient Encounter Prescriptions as of 08/17/2014  Medication Sig  . aspirin 81 MG tablet Take 81 mg by mouth daily.    . carbidopa-levodopa (SINEMET IR) 25-100 MG per tablet Take 0.5-1 tablets by mouth 3 (three) times daily.   . carvedilol (COREG) 6.25 MG tablet Take 1 tablet (6.25 mg total) by mouth 2 (two) times daily with a meal.  . Cholecalciferol (VITAMIN D3) 1000 UNITS CAPS Take 1,000 Units by mouth daily.   . cyanocobalamin (,VITAMIN B-12,) 1000 MCG/ML injection Inject 1 mL (1,000 mcg total) into the muscle every 30 (thirty) days.  . fluticasone (FLONASE) 50 MCG/ACT  nasal spray Place 2 sprays into both nostrils at bedtime.  Marland Kitchen LORazepam (ATIVAN) 0.5 MG tablet Take 0.5-10 tablets (0.25-5 mg total) by mouth 2 (two) times daily as needed for anxiety.  . Magnesium 250 MG TABS Take 250 mg by mouth daily.   . pantoprazole (PROTONIX) 40 MG tablet Take 40 mg by mouth daily.  [name] Glycol-Propyl Glycol (SYSTANE) 0.4-0.3 % SOLN Apply 1 drop to eye 2 (two) times daily.   . polyethylene glycol (MIRALAX / GLYCOLAX) packet Take 17 g by mouth every other day.   . potassium chloride (K-DUR) 10 MEQ tablet Take 10 mEq by mouth 2 (two) times daily.   Marland Kitchen triamterene-hydrochlorothiazide (MAXZIDE-25) 37.5-25 MG per tablet Take 1 tablet by mouth daily.    EXAM:  BP 180/86  Pulse 75  Temp(Src) 98.2 F (36.8 C) (Oral)  Ht 5' 0.25" (1.53 m)  Wt 127 lb 1.6 oz (57.652 kg)  BMI 24.63 kg/m2  SpO2 98%  Body mass index is 24.63 kg/(m^2).  GENERAL: vitals reviewed and listed above, alert, oriented, appears well hydrated and in no acute distress non toxic but mildly  ill  appearing  HEENT: atraumatic, conjunctiva  clear, no obvious abnormalities on inspection of external nose and ears congested no face pain  OP : no lesion edema or exudate  NECK: no obvious masses on inspection palpation  LUNGS: clear to auscultation bilaterally, no wheezes, rales or rhonchi,  = air movement  CV: HRRR, no clubbing cyanosis or  peripheral edema nl cap refill  MS: moves all extremities without noticeable focal  Abnormality oa changes   cardiac monitor apparatus   ASSESSMENT AND PLAN:  Discussed the following assessment and plan:  Acute respiratory infection - probably viral . no obv bacterial infection at thsi time  reviwee alarm features expectant manaagment and fu contact in 2 days before weekend, low threshold No exposures feif fever relapse recheck eval right away -Patient advised to return or notify health care team  if symptoms worsen ,persist or new concerns arise.  Patient Instructions  This acts like a viral respiratory infection.  Chest exam is normal . Rest and fluids . Tylenol for body aches and fever.  Have nurse  Get vital signs   And temperature . And if fever recurs contact for recheck. Contact us Friday morning or any time about status  And how  You are doing . There is a Saturday am clinic if call the oncall service and need to be seen.   You may begin to have a cough before getting better.   BP readings seem to be ok except today .   Standley Brooking. Panosh M.D.

## 2014-08-19 ENCOUNTER — Encounter: Payer: Self-pay | Admitting: Internal Medicine

## 2014-08-22 ENCOUNTER — Emergency Department (HOSPITAL_COMMUNITY): Payer: Medicare Other

## 2014-08-22 ENCOUNTER — Inpatient Hospital Stay (HOSPITAL_COMMUNITY)
Admission: EM | Admit: 2014-08-22 | Discharge: 2014-08-25 | DRG: 310 | Disposition: A | Discharge status: Nursing Home (Includes ICF) | Payer: Medicare Other | Attending: Interventional Cardiology | Admitting: Interventional Cardiology

## 2014-08-22 ENCOUNTER — Encounter (HOSPITAL_COMMUNITY): Payer: Self-pay | Admitting: Emergency Medicine

## 2014-08-22 ENCOUNTER — Telehealth: Payer: Self-pay | Admitting: Physician Assistant

## 2014-08-22 DIAGNOSIS — I442 Atrioventricular block, complete: Secondary | ICD-10-CM | POA: Diagnosis not present

## 2014-08-22 DIAGNOSIS — R001 Bradycardia, unspecified: Secondary | ICD-10-CM

## 2014-08-22 DIAGNOSIS — I119 Hypertensive heart disease without heart failure: Secondary | ICD-10-CM

## 2014-08-22 DIAGNOSIS — G20 Parkinson's disease: Secondary | ICD-10-CM

## 2014-08-22 DIAGNOSIS — I498 Other specified cardiac arrhythmias: Secondary | ICD-10-CM | POA: Diagnosis present

## 2014-08-22 DIAGNOSIS — G20A1 Parkinson's disease without dyskinesia, without mention of fluctuations: Secondary | ICD-10-CM | POA: Diagnosis present

## 2014-08-22 DIAGNOSIS — I10 Essential (primary) hypertension: Secondary | ICD-10-CM | POA: Diagnosis present

## 2014-08-22 DIAGNOSIS — R55 Syncope and collapse: Secondary | ICD-10-CM | POA: Diagnosis not present

## 2014-08-22 DIAGNOSIS — Z79899 Other long term (current) drug therapy: Secondary | ICD-10-CM

## 2014-08-22 DIAGNOSIS — Z9849 Cataract extraction status, unspecified eye: Secondary | ICD-10-CM

## 2014-08-22 DIAGNOSIS — Z7982 Long term (current) use of aspirin: Secondary | ICD-10-CM

## 2014-08-22 DIAGNOSIS — E785 Hyperlipidemia, unspecified: Secondary | ICD-10-CM | POA: Diagnosis present

## 2014-08-22 DIAGNOSIS — M1990 Unspecified osteoarthritis, unspecified site: Secondary | ICD-10-CM | POA: Diagnosis present

## 2014-08-22 HISTORY — DX: Atrioventricular block, complete: I44.2

## 2014-08-22 LAB — MAGNESIUM: MAGNESIUM: 1.7 mg/dL (ref 1.5–2.5)

## 2014-08-22 LAB — CBC
HEMATOCRIT: 37.3 % (ref 36.0–46.0)
Hemoglobin: 12.4 g/dL (ref 12.0–15.0)
MCH: 27.1 pg (ref 26.0–34.0)
MCHC: 33.2 g/dL (ref 30.0–36.0)
MCV: 81.6 fL (ref 78.0–100.0)
PLATELETS: 273 10*3/uL (ref 150–400)
RBC: 4.57 MIL/uL (ref 3.87–5.11)
RDW: 12.4 % (ref 11.5–15.5)
WBC: 8.9 10*3/uL (ref 4.0–10.5)

## 2014-08-22 LAB — I-STAT TROPONIN, ED: Troponin i, poc: 0 ng/mL (ref 0.00–0.08)

## 2014-08-22 LAB — BASIC METABOLIC PANEL
ANION GAP: 13 (ref 5–15)
BUN: 15 mg/dL (ref 6–23)
CO2: 26 meq/L (ref 19–32)
Calcium: 9.5 mg/dL (ref 8.4–10.5)
Chloride: 99 mEq/L (ref 96–112)
Creatinine, Ser: 1.06 mg/dL (ref 0.50–1.10)
GFR calc Af Amer: 54 mL/min — ABNORMAL LOW (ref >90)
GFR calc non Af Amer: 46 mL/min — ABNORMAL LOW (ref >90)
Glucose, Bld: 110 mg/dL — ABNORMAL HIGH (ref 70–99)
Potassium: 4 mEq/L (ref 3.7–5.3)
Sodium: 138 mEq/L (ref 137–147)

## 2014-08-22 LAB — MRSA PCR SCREENING: MRSA by PCR: POSITIVE — AB

## 2014-08-22 MED ORDER — MAGNESIUM 250 MG PO TABS: 250.0000 mg | ORAL_TABLET | Freq: Every day | ORAL | Status: DC

## 2014-08-22 MED ORDER — ASPIRIN EC 81 MG PO TBEC
81.0000 mg | DELAYED_RELEASE_TABLET | Freq: Every day | ORAL | Status: DC
  Administered 2014-08-22 – 2014-08-25 (×4): 81 mg via ORAL
  Filled 2014-08-22 (×4): qty 1

## 2014-08-22 MED ORDER — CARBIDOPA-LEVODOPA 25-100 MG PO TABS
1.0000 | ORAL_TABLET | Freq: Two times a day (BID) | ORAL | Status: DC
  Administered 2014-08-22 – 2014-08-25 (×6): 1 via ORAL
  Filled 2014-08-22 (×7): qty 1

## 2014-08-22 MED ORDER — AMLODIPINE BESYLATE 5 MG PO TABS
5.0000 mg | ORAL_TABLET | Freq: Every day | ORAL | Status: DC
  Administered 2014-08-23: 5 mg via ORAL
  Filled 2014-08-22: qty 1

## 2014-08-22 MED ORDER — LORAZEPAM 0.5 MG PO TABS
0.5000 mg | ORAL_TABLET | Freq: Two times a day (BID) | ORAL | Status: DC | PRN
  Administered 2014-08-22 – 2014-08-24 (×4): 0.5 mg via ORAL
  Filled 2014-08-22 (×4): qty 1

## 2014-08-22 MED ORDER — POLYVINYL ALCOHOL 1.4 % OP SOLN
1.0000 [drp] | Freq: Two times a day (BID) | OPHTHALMIC | Status: DC
  Administered 2014-08-22 – 2014-08-25 (×6): 1 [drp] via OPHTHALMIC
  Filled 2014-08-22: qty 15

## 2014-08-22 MED ORDER — POLYETHYL GLYCOL-PROPYL GLYCOL 0.4-0.3 % OP SOLN: 1.0000 [drp] | Freq: Two times a day (BID) | OPHTHALMIC | Status: DC

## 2014-08-22 MED ORDER — PANTOPRAZOLE SODIUM 40 MG PO TBEC
40.0000 mg | DELAYED_RELEASE_TABLET | Freq: Every day | ORAL | Status: DC
  Administered 2014-08-22 – 2014-08-25 (×4): 40 mg via ORAL
  Filled 2014-08-22 (×4): qty 1

## 2014-08-22 MED ORDER — TRIAMTERENE-HCTZ 37.5-25 MG PO TABS
1.0000 | ORAL_TABLET | Freq: Every day | ORAL | Status: DC
  Administered 2014-08-23 – 2014-08-25 (×3): 1 via ORAL
  Filled 2014-08-22 (×3): qty 1

## 2014-08-22 MED ORDER — CARBIDOPA-LEVODOPA 25-100 MG PO TABS
0.5000 | ORAL_TABLET | Freq: Three times a day (TID) | ORAL | Status: DC
Start: 2014-08-22 — End: 2014-08-22

## 2014-08-22 MED ORDER — POLYETHYLENE GLYCOL 3350 17 G PO PACK
17.0000 g | PACK | ORAL | Status: DC
  Administered 2014-08-24 – 2014-08-25 (×2): 17 g via ORAL
  Filled 2014-08-22 (×2): qty 1

## 2014-08-22 MED ORDER — HYDRALAZINE HCL 25 MG PO TABS
25.0000 mg | ORAL_TABLET | Freq: Three times a day (TID) | ORAL | Status: DC
  Administered 2014-08-22 – 2014-08-25 (×9): 25 mg via ORAL
  Filled 2014-08-22 (×11): qty 1

## 2014-08-22 MED ORDER — HYDRALAZINE HCL 20 MG/ML IJ SOLN: 10.0000 mg | Freq: Four times a day (QID) | INTRAMUSCULAR | Status: DC | PRN

## 2014-08-22 MED ORDER — POTASSIUM CHLORIDE ER 10 MEQ PO TBCR
10.0000 meq | EXTENDED_RELEASE_TABLET | Freq: Two times a day (BID) | ORAL | Status: DC
  Administered 2014-08-22 – 2014-08-25 (×6): 10 meq via ORAL
  Filled 2014-08-22 (×7): qty 1

## 2014-08-22 MED ORDER — MAGNESIUM OXIDE 400 (241.3 MG) MG PO TABS
200.0000 mg | ORAL_TABLET | Freq: Every day | ORAL | Status: DC
  Administered 2014-08-22 – 2014-08-25 (×4): 200 mg via ORAL
  Filled 2014-08-22 (×4): qty 0.5

## 2014-08-22 MED ORDER — CARBIDOPA-LEVODOPA 25-100 MG PO TABS
0.5000 | ORAL_TABLET | ORAL | Status: DC
  Administered 2014-08-23 – 2014-08-24 (×2): 0.5 via ORAL
  Filled 2014-08-22 (×3): qty 0.5

## 2014-08-22 NOTE — ED Notes (Signed)
attempted report 

## 2014-08-22 NOTE — ED Notes (Signed)
MD at bedside. 

## 2014-08-22 NOTE — ED Notes (Signed)
Glucose 110 with blood work

## 2014-08-22 NOTE — ED Notes (Signed)
Pt here for syncopal episode, pt is wearing a holter monitor for caridiologist for cardiac episodes and was wearing monitor at time of event, pt does not recall what happened, [facility]. Here by [facility] em[facility].

## 2014-08-22 NOTE — Telephone Encounter (Signed)
The patient is an 80 year old female who was previously admitted on 07/19/2014 with possible syncope. She was discharged on a 30 day event monitor and plan to followup with Dr. Daneen Schick in October 2015. This morning, while in the bathroom, patient had another syncopal episode and fell to the ground. She denies any urinary or bowel incontinence. Patient contacted the [facility] who in return contacted to [facility] cardiology team after hour service. According to be cardio, patient was in sinus rhythm, however she had 7 seconds of severe bradycardia down into the 20s. She also had roughly 3 second sinus pause.  I have contacted the patient, and advised her to come to the ED for further evaluation. She has agreed to come to [employer] [facility] Pager: [phone]

## 2014-08-22 NOTE — ED Notes (Signed)
Lunch tray given. 

## 2014-08-22 NOTE — ED Provider Notes (Signed)
CSN: [id number]     Arrival date & time 08/22/14  1041 History   First MD Initiated Contact with Patient 08/22/14 1309     Chief Complaint  Patient presents with  . Loss of Consciousness     (Consider location/radiation/quality/duration/timing/severity/associated sxs/prior Treatment) The history is provided by the patient and medical records.   This is an 80 y.o. F with PMH significant for HTN, HLP, OA, Parkinson's disease, presenting to the ED from [place] for syncopal episode.  Patient was recent admitted to the hospital last month for the same, now wearing 30 day cardiac event monitor.  States this morning she was standing at the sink to brush her teeth when she began feeling very lightheaded and then everything went black.  States she woke up with nursing staff around her on the floor.  Bathroom floor is carpeted, did not experience head trauma.  Patient states she initially felt "foggy" for a few minutes but returned to baseline soon after.  Currently she has no complaints.  Patient is very active at baseline, is concerned that recurrent syncope will hinder this.  VS stable on arrival.  Past Medical History  Diagnosis Date  . HYPERLIPIDEMIA 02/21/2009  . HYPOKALEMIA 08/17/2007  . ANEMIA, B12 DEFICIENCY 03/13/2009  . HYPERTENSION 05/14/2007  . Blood in stool 01/25/2008  . OSTEOARTHRITIS 05/14/2007  . OSTEOPENIA 05/14/2007    2007 hx of fosamax use   . TREMOR 07/08/2007  . FREQUENCY, URINARY 08/19/2008  . LUMBAR STRAIN 09/21/2007  . Gastric polyp     panendo  5 2009  . History of ankle fracture   . Parkinson's disease   . Adenomatous colon polyp    Past Surgical History  Procedure Laterality Date  . Abdominal hysterectomy    . Fracture  2007    left ankle- Hx of fosamax use  . Dilation and curettage of uterus    . Cataract extraction, bilateral    . Colonoscopy N/A 07/19/2013    Procedure: COLONOSCOPY;  Surgeon: Lafayette Dragon, MD;  Location: [facility];  Service: Endoscopy;   Laterality: N/A;   Family History  Problem Relation Age of Onset  . Heart attack Mother   . Coronary artery disease Mother   . Colon cancer Sister     x 2  . Breast cancer Sister     x 4  . Parkinsonism Brother   . Peripheral vascular disease      sibling  . Stroke Father   . Colon cancer Other     nephew  . Colon cancer Paternal Aunt   . Breast cancer Sister   . Breast cancer Sister    History  Substance Use Topics  . Smoking status: Never Smoker   . Smokeless tobacco: Never Used  . Alcohol Use: No   OB History   Grav Para Term Preterm Abortions TAB SAB Ect Mult Living   5 5             Review of Systems  Neurological: Positive for syncope.  All other systems reviewed and are negative.     Allergies  Review of patient's allergies indicates no known allergies.  Home Medications   Prior to Admission medications   Medication Sig Start Date End Date Taking? Authorizing Provider  aspirin 81 MG tablet Take 81 mg by mouth daily.     Yes Historical Provider, MD  carbidopa-levodopa (SINEMET IR) 25-100 MG per tablet Take 0.5-1 tablets by mouth 3 (three) times daily. Take 1  tablet in the morning, 1/2 tablet at 4pm, and 1 tablet at bedtime.   Yes Historical Provider, MD  carvedilol (COREG) 6.25 MG tablet Take 1 tablet (6.25 mg total) by mouth 2 (two) times daily with a meal. 07/25/14  Yes Laurey Morale, MD  Cholecalciferol (VITAMIN D3) 1000 UNITS CAPS Take 1,000 Units by mouth daily.    Yes Historical Provider, MD  cyanocobalamin (,VITAMIN B-12,) 1000 MCG/ML injection Inject 1 mL (1,000 mcg total) into the muscle every 30 (thirty) days. 07/05/14  Yes Burnis Medin, MD  fluticasone (FLONASE) 50 MCG/ACT nasal spray Place 2 sprays into both nostrils at bedtime.   Yes Historical Provider, MD  LORazepam (ATIVAN) 0.5 MG tablet Take 0.5 mg by mouth 2 (two) times daily as needed for anxiety.   Yes Historical Provider, MD  Magnesium 250 MG TABS Take 250 mg by mouth daily.    Yes  Historical Provider, MD  pantoprazole (PROTONIX) 40 MG tablet Take 40 mg by mouth daily.   Yes Historical Provider, MD  Polyethyl Glycol-Propyl Glycol (SYSTANE) 0.4-0.3 % SOLN Apply 1 drop to eye 2 (two) times daily.    Yes Historical Provider, MD  polyethylene glycol (MIRALAX / GLYCOLAX) packet Take 17 g by mouth every other day.    Yes Historical Provider, MD  potassium chloride (K-DUR) 10 MEQ tablet Take 10 mEq by mouth 2 (two) times daily.    Yes Historical Provider, MD  triamterene-hydrochlorothiazide (MAXZIDE-25) 37.5-25 MG per tablet Take 1 tablet by mouth daily.   Yes Historical Provider, MD   BP 191/82  Pulse 73  Temp(Src) 98.6 F (37 C) (Oral)  Resp 26  Ht 5\' 2"  (1.575 m)  Wt 128 lb (58.06 kg)  BMI 23.41 kg/m2  SpO2 100% Physical Exam  Nursing note and vitals reviewed. Constitutional: She is oriented to person, place, and time. She appears well-developed and well-nourished.  HENT:  Head: Normocephalic and atraumatic.  Mouth/Throat: Oropharynx is clear and moist.  No visible signs of head trauma  Eyes: Conjunctivae and EOM are normal. Pupils are equal, round, and reactive to light.  Neck: Normal range of motion.  Cardiovascular: Normal rate, regular rhythm and normal heart sounds.   Pulmonary/Chest: Effort normal and breath sounds normal.  Abdominal: Soft. Bowel sounds are normal.  Musculoskeletal: Normal range of motion.  Neurological: She is alert and oriented to person, place, and time.  AAOx3, answering questions and following commands appropriately; equal strength UE and LE bilaterally; CN grossly intact; moves all extremities appropriately without ataxia; no focal neuro deficits or facial asymmetry appreciated  Skin: Skin is warm and dry.  Psychiatric: She has a normal mood and affect.    ED Course  Procedures (including critical care time) Labs Review Labs Reviewed  BASIC METABOLIC PANEL - Abnormal; Notable for the following:    Glucose, Bld 110 (*)    GFR  calc non Af Amer 46 (*)    GFR calc Af Amer 54 (*)    All other components within normal limits  CBC  I-STAT TROPOININ, ED  CBG MONITORING, ED    Imaging Review Dg Chest Portable 1 View  08/22/2014   CLINICAL DATA:  Loss of consciousness. No chest pain. Hypertension.  EXAM: PORTABLE CHEST - 1 VIEW  COMPARISON:  07/19/2014 chest x-ray.  FINDINGS: No infiltrate, congestive heart failure or pneumothorax.  Mildly tortuous aorta.  Heart size top-normal.  IMPRESSION: No active disease.  Please see above.   Electronically Signed   By: Chauncey Cruel  M.D.   On: 08/22/2014 11:58     EKG Interpretation   Date/Time:  Monday August 22 2014 10:53:42 EDT Ventricular Rate:  68 PR Interval:  185 QRS Duration: 91 QT Interval:  385 QTC Calculation: 409 R Axis:   36 Text Interpretation:  Sinus rhythm Anteroseptal infarct, age indeterminate  No significant change since last tracing Confirmed by Christy Gentles  MD, DONALD  [phone]) on 08/22/2014 11:05:30 AM      MDM   Final diagnoses:  Syncope and collapse  Bradycardia   80 year old female with recurrent syncope. Admission last month for the same, currently wearing a 30 day cardiac event monitor. This morning at time of syncope patient was noted to have a 7 second episode of bradycardia with rate in the 20s followed by 3 second sinus pause on event monitor. There was no noted arrhythmia. On exam, patient is awake and alert without any focal neurologic deficits. Her workup in the ED has been unremarkable. She continues to be asymptomatic.  Case discussed with cardiology, will evaluate in the ED and admit.  Likely will need pacemaker placement.  Larene Pickett, PA-C 08/22/14 1530

## 2014-08-22 NOTE — ED Notes (Signed)
Given crackers and peanutbutter, nad noted, abc intact. Family at bedside. Will monitor.

## 2014-08-22 NOTE — H&P (Signed)
Cardiologist:  Lirio Bach is an 80 y.o. female.   Chief Complaint:  Syncope HPI:   The patient is an 80 yo active female with a history of HTN, HLD, parkinson's, anemia.  Shje was admitted in Jul 19, 2014 with syncope   Patient and daughter say she had 2 spells that day  First she was by herself  Not clear if passed out.  Short blank in activity.  Later in day was sitting  For few seconds she paused, didn't react  Turned head and was fine.  Came to hosp.  Set up with a 30 day HR monitor.  Echo on 07/20/14 revealed an EF of 55-60%, normal wall motion, G1DD and no valvular abnormalities.  She had a normal EEG and unremarkable MRI of the head. Taken off of inderal (used for tremor) After d/c was put on Coreg 6.25 bid    Today, she woke up  Was feeling OK  Went to bathroom  Was brushing teeth when she became very dizzy  Sat down on toilet  Next thing on floor.  Got up  Used walker to get to bed.  Wearing monitor  At that time (6:45 CT) she had 2 3 sec pauses with 1 beat between.   Patient still feels a little weak.  A little nauseated.      Medications Prior to Admission medications   Medication Sig Start Date End Date Taking? Authorizing Provider  aspirin 81 MG tablet Take 81 mg by mouth daily.     Yes Historical Provider, MD  carbidopa-levodopa (SINEMET IR) 25-100 MG per tablet Take 0.5-1 tablets by mouth 3 (three) times daily. Take 1 tablet in the morning, 1/2 tablet at 4pm, and 1 tablet at bedtime.   Yes Historical Provider, MD  carvedilol (COREG) 6.25 MG tablet Take 1 tablet (6.25 mg total) by mouth 2 (two) times daily with a meal. 07/25/14  Yes Laurey Morale, MD  Cholecalciferol (VITAMIN D3) 1000 UNITS CAPS Take 1,000 Units by mouth daily.    Yes Historical Provider, MD  cyanocobalamin (,VITAMIN B-12,) 1000 MCG/ML injection Inject 1 mL (1,000 mcg total) into the muscle every 30 (thirty) days. 07/05/14  Yes Burnis Medin, MD  fluticasone (FLONASE) 50 MCG/ACT nasal spray Place 2  sprays into both nostrils at bedtime.   Yes Historical Provider, MD  LORazepam (ATIVAN) 0.5 MG tablet Take 0.5 mg by mouth 2 (two) times daily as needed for anxiety.   Yes Historical Provider, MD  Magnesium 250 MG TABS Take 250 mg by mouth daily.    Yes Historical Provider, MD  pantoprazole (PROTONIX) 40 MG tablet Take 40 mg by mouth daily.   Yes Historical Provider, MD  Polyethyl Glycol-Propyl Glycol (SYSTANE) 0.4-0.3 % SOLN Apply 1 drop to eye 2 (two) times daily.    Yes Historical Provider, MD  polyethylene glycol (MIRALAX / GLYCOLAX) packet Take 17 g by mouth every other day.    Yes Historical Provider, MD  potassium chloride (K-DUR) 10 MEQ tablet Take 10 mEq by mouth 2 (two) times daily.    Yes Historical Provider, MD  triamterene-hydrochlorothiazide (MAXZIDE-25) 37.5-25 MG per tablet Take 1 tablet by mouth daily.   Yes Historical Provider, MD    Past Medical History  Diagnosis Date  . HYPERLIPIDEMIA 02/21/2009  . HYPOKALEMIA 08/17/2007  . ANEMIA, B12 DEFICIENCY 03/13/2009  . HYPERTENSION 05/14/2007  . Blood in stool 01/25/2008  . OSTEOARTHRITIS 05/14/2007  . OSTEOPENIA 05/14/2007    2007 hx of  fosamax use   . TREMOR 07/08/2007  . FREQUENCY, URINARY 08/19/2008  . LUMBAR STRAIN 09/21/2007  . Gastric polyp     panendo  5 2009  . History of ankle fracture   . Parkinson's disease   . Adenomatous colon polyp     Past Surgical History  Procedure Laterality Date  . Abdominal hysterectomy    . Fracture  2007    left ankle- Hx of fosamax use  . Dilation and curettage of uterus    . Cataract extraction, bilateral    . Colonoscopy N/A 07/19/2013    Procedure: COLONOSCOPY;  Surgeon: Lafayette Dragon, MD;  Location: [facility];  Service: Endoscopy;  Laterality: N/A;    Family History  Problem Relation Age of Onset  . Heart attack Mother   . Coronary artery disease Mother   . Colon cancer Sister     x 2  . Breast cancer Sister     x 4  . Parkinsonism Brother   . Peripheral vascular disease        sibling  . Stroke Father   . Colon cancer Other     nephew  . Colon cancer Paternal Aunt   . Breast cancer Sister   . Breast cancer Sister    Social History:  reports that she has never smoked. She has never used smokeless tobacco. She reports that she does not drink alcohol or use illicit drugs.  Allergies: No Known Allergies   (Not in a hospital admission)  Results for orders placed during the hospital encounter of 08/22/14 (from the past 48 hour(s))  CBC     Status: None   Collection Time    08/22/14 11:01 AM      Result Value Ref Range   WBC 8.9  4.0 - 10.5 K/uL   RBC 4.57  3.87 - 5.11 MIL/uL   Hemoglobin 12.4  12.0 - 15.0 g/dL   HCT 37.3  36.0 - 46.0 %   MCV 81.6  78.0 - 100.0 fL   MCH 27.1  26.0 - 34.0 pg   MCHC 33.2  30.0 - 36.0 g/dL   RDW 12.4  11.5 - 15.5 %   Platelets 273  150 - 400 K/uL  BASIC METABOLIC PANEL     Status: Abnormal   Collection Time    08/22/14 11:01 AM      Result Value Ref Range   Sodium 138  137 - 147 mEq/L   Potassium 4.0  3.7 - 5.3 mEq/L   Chloride 99  96 - 112 mEq/L   CO2 26  19 - 32 mEq/L   Glucose, Bld 110 (*) 70 - 99 mg/dL   BUN 15  6 - 23 mg/dL   Creatinine, Ser 1.06  0.50 - 1.10 mg/dL   Calcium 9.5  8.4 - 10.5 mg/dL   GFR calc non Af Amer 46 (*) >90 mL/min   GFR calc Af Amer 54 (*) >90 mL/min   Comment: (NOTE)     The eGFR has been calculated using the CKD EPI equation.     This calculation has not been validated in all clinical situations.     eGFR's persistently <90 mL/min signify possible Chronic Kidney     Disease.   Anion gap 13  5 - 15  I-STAT TROPOININ, ED     Status: None   Collection Time    08/22/14 11:13 AM      Result Value Ref Range   Troponin i, poc 0.00  0.00 - 0.08 ng/mL   Comment 3            Comment: Due to the release kinetics of cTnI,     a negative result within the first hours     of the onset of symptoms does not rule out     myocardial infarction with certainty.     If myocardial infarction is  still suspected,     repeat the test at appropriate intervals.   Dg Chest Portable 1 View  08/22/2014   CLINICAL DATA:  Loss of consciousness. No chest pain. Hypertension.  EXAM: PORTABLE CHEST - 1 VIEW  COMPARISON:  07/19/2014 chest x-ray.  FINDINGS: No infiltrate, congestive heart failure or pneumothorax.  Mildly tortuous aorta.  Heart size top-normal.  IMPRESSION: No active disease.  Please see above.   Electronically Signed   By: Chauncey Cruel M.D.   On: 08/22/2014 11:58    Review of Systems  Constitutional: Negative for fever and diaphoresis.  HENT: Negative for congestion and sore throat.   Respiratory: Negative for cough and shortness of breath.   Cardiovascular: Positive for leg swelling (mild, resolves by morning. ). Negative for chest pain, orthopnea and PND.  Gastrointestinal: Positive for nausea (A little currently). Negative for vomiting, abdominal pain and blood in stool.  Musculoskeletal: Positive for myalgias.  Neurological: Positive for dizziness and loss of consciousness.  All other systems reviewed and are negative.   Blood pressure 174/60, pulse 66, temperature 98.6 F (37 C), temperature source Oral, resp. rate 23, height $RemoveBe'5\' 2"'DKtlieRgc$  (1.575 m), weight 128 lb (58.06 kg), SpO2 97.00%. Physical Exam  Nursing note and vitals reviewed. Constitutional: She appears well-developed and well-nourished. No distress.  HENT:  Head: Normocephalic and atraumatic.  Mouth/Throat: No oropharyngeal exudate.  Eyes: EOM are normal. Pupils are equal, round, and reactive to light. No scleral icterus.  Neck: Normal range of motion. Neck supple.  Cardiovascular: Normal rate, regular rhythm, S1 normal and S2 normal.   No murmur heard. Pulses:      Radial pulses are 2+ on the right side, and 2+ on the left side.       Dorsalis pedis pulses are 2+ on the right side, and 2+ on the left side.  No Carotid bruits  Respiratory: Effort normal and breath sounds normal. She has no wheezes. She has no  rales.  GI: Soft. Bowel sounds are normal. She exhibits no distension. There is no tenderness.  Musculoskeletal: She exhibits no edema.  Lymphadenopathy:    She has no cervical adenopathy.  Neurological: She is alert. She exhibits normal muscle tone.  Skin: Skin is warm and dry.  Psychiatric: She has a normal mood and affect.     Assessment/Plan Principal Problem:   Syncope Active Problems:   HYPERLIPIDEMIA   HYPERTENSION   Complete heart block  Plan:   The patient will be admitted for monitoring.  Coreg will be stopped.  EP consult for possible PPM.  BP is high  Will add amlodipine.  (hydralazine given today  Switch due to ease of use)   HAGER, BRYAN, PA-C 08/22/2014, 4:20 PM  Patient seen and examined.  I have amended Hx above to reflect my findings.  Today was worst spell that she has had  Monitor with 2 3 sec pauses closely spaced then 3rd. Would hold b blocker.  Continue tele.  Discuss with EP in AM Check orhtostatics  Will need to watch bp   Add amlodipine  Dorris Carnes

## 2014-08-22 NOTE — Progress Notes (Signed)
Pt admitted from ED for syncope. Pt A/O, pt on room air with sats in high 90's. SBP in 160's to 170's. BP medicine scheduled and prn; scheduled med given. Will continue to monitor and follow orders.

## 2014-08-22 NOTE — ED Provider Notes (Signed)
Medical screening examination/treatment/procedure(s) were performed by non-physician practitioner and as supervising physician I was immediately available for consultation/collaboration.   EKG Interpretation   Date/Time:  Monday August 22 2014 10:53:42 EDT Ventricular Rate:  68 PR Interval:  185 QRS Duration: 91 QT Interval:  385 QTC Calculation: 409 R Axis:   36 Text Interpretation:  Sinus rhythm Anteroseptal infarct, age indeterminate  No significant change since last tracing Confirmed by Christy Gentles  MD, Ester Hilley  [phone]) on 08/22/2014 11:05:30 AM        Sharyon Cable, MD 08/22/14 1537

## 2014-08-22 NOTE — ED Notes (Signed)
Cardiology at bedside.

## 2014-08-23 ENCOUNTER — Telehealth: Payer: Self-pay | Admitting: *Deleted

## 2014-08-23 DIAGNOSIS — E785 Hyperlipidemia, unspecified: Secondary | ICD-10-CM | POA: Diagnosis present

## 2014-08-23 DIAGNOSIS — Z9849 Cataract extraction status, unspecified eye: Secondary | ICD-10-CM | POA: Diagnosis not present

## 2014-08-23 DIAGNOSIS — I442 Atrioventricular block, complete: Secondary | ICD-10-CM | POA: Diagnosis present

## 2014-08-23 DIAGNOSIS — G20 Parkinson's disease: Secondary | ICD-10-CM | POA: Diagnosis present

## 2014-08-23 DIAGNOSIS — M1990 Unspecified osteoarthritis, unspecified site: Secondary | ICD-10-CM | POA: Diagnosis present

## 2014-08-23 DIAGNOSIS — Z79899 Other long term (current) drug therapy: Secondary | ICD-10-CM | POA: Diagnosis not present

## 2014-08-23 DIAGNOSIS — I498 Other specified cardiac arrhythmias: Secondary | ICD-10-CM | POA: Diagnosis present

## 2014-08-23 DIAGNOSIS — I10 Essential (primary) hypertension: Secondary | ICD-10-CM | POA: Diagnosis present

## 2014-08-23 DIAGNOSIS — Z7982 Long term (current) use of aspirin: Secondary | ICD-10-CM | POA: Diagnosis not present

## 2014-08-23 DIAGNOSIS — R55 Syncope and collapse: Secondary | ICD-10-CM | POA: Diagnosis present

## 2014-08-23 DIAGNOSIS — I119 Hypertensive heart disease without heart failure: Secondary | ICD-10-CM

## 2014-08-23 LAB — BASIC METABOLIC PANEL
ANION GAP: 16 — AB (ref 5–15)
BUN: 13 mg/dL (ref 6–23)
CO2: 24 meq/L (ref 19–32)
Calcium: 9.3 mg/dL (ref 8.4–10.5)
Chloride: 98 mEq/L (ref 96–112)
Creatinine, Ser: 0.96 mg/dL (ref 0.50–1.10)
GFR calc Af Amer: 60 mL/min — ABNORMAL LOW (ref >90)
GFR calc non Af Amer: 52 mL/min — ABNORMAL LOW (ref >90)
Glucose, Bld: 104 mg/dL — ABNORMAL HIGH (ref 70–99)
Potassium: 3.6 mEq/L — ABNORMAL LOW (ref 3.7–5.3)
SODIUM: 138 meq/L (ref 137–147)

## 2014-08-23 MED ORDER — ONDANSETRON HCL 4 MG/2ML IJ SOLN
4.0000 mg | Freq: Four times a day (QID) | INTRAMUSCULAR | Status: DC | PRN
  Administered 2014-08-23 – 2014-08-25 (×4): 4 mg via INTRAVENOUS
  Filled 2014-08-23 (×4): qty 2

## 2014-08-23 MED ORDER — MUPIROCIN 2 % EX OINT
1.0000 "application " | TOPICAL_OINTMENT | Freq: Two times a day (BID) | CUTANEOUS | Status: DC
  Administered 2014-08-23 – 2014-08-25 (×4): 1 via NASAL
  Filled 2014-08-23: qty 22

## 2014-08-23 MED ORDER — CHLORHEXIDINE GLUCONATE CLOTH 2 % EX PADS
6.0000 | MEDICATED_PAD | Freq: Every day | CUTANEOUS | Status: DC
  Administered 2014-08-24 – 2014-08-25 (×2): 6 via TOPICAL

## 2014-08-23 MED ORDER — LISINOPRIL 10 MG PO TABS
10.0000 mg | ORAL_TABLET | Freq: Every day | ORAL | Status: DC
  Administered 2014-08-23 – 2014-08-25 (×3): 10 mg via ORAL
  Filled 2014-08-23 (×3): qty 1

## 2014-08-23 NOTE — Telephone Encounter (Signed)
Agree with the recommendation.

## 2014-08-23 NOTE — Telephone Encounter (Signed)
Received recording from [facility] showing pt had passed out. Rhythm showed sinus rhythm w/1st degree AVB/ 3.0 second Ventricular Standstill.  Pt was admitted yesterday 9/7 to ER.

## 2014-08-23 NOTE — Consult Note (Signed)
ELECTROPHYSIOLOGY CONSULT NOTE    Patient ID: Kimberly Silva MRN: [id number], DOB/AGE: 07/05/1926 80 y.o.  Admit date: 08/22/2014 Date of Consult: 08-23-2014  Primary Physician: Lottie Dawson, MD Primary Cardiologist: Tamala Julian (not yet seen)  Reason for Consultation: syncope and heart block  HPI:  Kimberly Silva is a 80 y.o. female with a past medical history significant for hyperlipidemia, hypertension, Parkinson's disease, and prior syncope in August of this year.  At that time, she was at home ([facility]) and had an episode where she "slumped over" in her chair.  This was unwitnessed, and the patient is unsure if she had syncope or not.  Her daughter was visiting later that day and she had an episode of altered level of consciousness with increased hand tremors.  She was brought to [facility] for further evaluation.  Telemetry demonstrated sinus bradycardia and her propranolol was discontinued.  Event monitoring was arranged.  After discharge, her blood pressure was elevated and she was evaluated by her PCP who started Coreg 6.25mg  twice daily.  On the day of admission, she was standing at the sink to brush her teeth when she developed a "funny feeling".  She attempted to sit on the commode, but awoke on the floor.  She called the holter monitoring company who advised her to come to the ER.  Event monitor demonstrated several seconds of complete heart block at the same time of her symptoms.  She was admitted and her Coreg has been held (last dose 9-7 am).  She has had no further heart block on telemetry.    Echo 07-2014 demonstrated EF 92-95%, grade 1 diastolic dysfunction.  No significant valvular abnormalities.   She denies chest pain, shortness of breath, palpitations, LE edema, recent nausea, vomiting.  She did have head congestion last week with associated fever that has since resolved.  ROS is otherwise negative.   Lab work is reviewed and grossly unremarkable.    EP has been asked to evaluate for treatment options.   Past Medical History  Diagnosis Date  . HYPERLIPIDEMIA 02/21/2009  . HYPOKALEMIA 08/17/2007  . ANEMIA, B12 DEFICIENCY 03/13/2009  . HYPERTENSION 05/14/2007  . Blood in stool 01/25/2008  . OSTEOARTHRITIS 05/14/2007  . OSTEOPENIA 05/14/2007    2007 hx of fosamax use   . TREMOR 07/08/2007  . FREQUENCY, URINARY 08/19/2008  . LUMBAR STRAIN 09/21/2007  . Gastric polyp     panendo  5 2009  . History of ankle fracture   . Parkinson's disease   . Adenomatous colon polyp      Surgical History:  Past Surgical History  Procedure Laterality Date  . Abdominal hysterectomy    . Fracture  2007    left ankle- Hx of fosamax use  . Dilation and curettage of uterus    . Cataract extraction, bilateral    . Colonoscopy N/A 07/19/2013    Procedure: COLONOSCOPY;  Surgeon: Lafayette Dragon, MD;  Location: [facility] ENDOSCOPY;  Service: Endoscopy;  Laterality: N/A;     Prescriptions prior to admission  Medication Sig Dispense Refill  . aspirin 81 MG tablet Take 81 mg by mouth daily.        . carbidopa-levodopa (SINEMET IR) 25-100 MG per tablet Take 0.5-1 tablets by mouth 3 (three) times daily. Take 1 tablet in the morning, 1/2 tablet at 4pm, and 1 tablet at bedtime.      . carvedilol (COREG) 6.25 MG tablet Take 1 tablet (6.25 mg total) by mouth 2 (two) times  daily with a meal.  60 tablet  2  . Cholecalciferol (VITAMIN D3) 1000 UNITS CAPS Take 1,000 Units by mouth daily.       . cyanocobalamin (,VITAMIN B-12,) 1000 MCG/ML injection Inject 1 mL (1,000 mcg total) into the muscle every 30 (thirty) days.  10 mL  0  . fluticasone (FLONASE) 50 MCG/ACT nasal spray Place 2 sprays into both nostrils at bedtime.      Marland Kitchen LORazepam (ATIVAN) 0.5 MG tablet Take 0.5 mg by mouth 2 (two) times daily as needed for anxiety.      . Magnesium 250 MG TABS Take 250 mg by mouth daily.       . pantoprazole (PROTONIX) 40 MG tablet Take 40 mg by mouth daily.      [name] Glycol-Propyl  Glycol (SYSTANE) 0.4-0.3 % SOLN Apply 1 drop to eye 2 (two) times daily.       . polyethylene glycol (MIRALAX / GLYCOLAX) packet Take 17 g by mouth every other day.       . potassium chloride (K-DUR) 10 MEQ tablet Take 10 mEq by mouth 2 (two) times daily.       Marland Kitchen triamterene-hydrochlorothiazide (MAXZIDE-25) 37.5-25 MG per tablet Take 1 tablet by mouth daily.        Inpatient Medications:  . amLODipine  5 mg Oral Daily  . aspirin EC  81 mg Oral Daily  . carbidopa-levodopa  0.5 tablet Oral Q24H  . carbidopa-levodopa  1 tablet Oral BID  . hydrALAZINE  25 mg Oral 3 times per day  . magnesium oxide  200 mg Oral Daily  . pantoprazole  40 mg Oral Daily  . polyethylene glycol  17 g Oral QODAY  . polyvinyl alcohol  1 drop Both Eyes BID  . potassium chloride  10 mEq Oral BID  . triamterene-hydrochlorothiazide  1 tablet Oral Daily    Allergies: No Known Allergies  History   Social History  . Marital Status: Widowed    Spouse Name: N/A    Number of Children: 25  . Years of Education: BA   Occupational History  . Retired     Pharmacist, hospital   Social History Main Topics  . Smoking status: Never Smoker   . Smokeless tobacco: Never Used  . Alcohol Use: No  . Drug Use: No  . Sexual Activity: Not on file   Other Topics Concern  . Not on file   Social History Narrative   Husband died in [place] in 2008/04/09 of [facility].   [facility]   G7P5   Caffeine Use: 1 soda daily     Family History  Problem Relation Age of Onset  . Heart attack Mother   . Coronary artery disease Mother   . Colon cancer Sister     x 2  . Breast cancer Sister     x 4  . Parkinsonism Brother   . Peripheral vascular disease      sibling  . Stroke Father   . Colon cancer Other     nephew  . Colon cancer Paternal Aunt   . Breast cancer Sister   . Breast cancer Sister     BP 144/52  Pulse 79  Temp(Src) 98.2 F (36.8 C) (Oral)  Resp 26  Ht 5\' 2"  (1.575 m)  Wt  128 lb (58.06 kg)  BMI 23.41 kg/m2  SpO2 96%  Physical Exam: Physical Exam: Filed Vitals:   08/23/14 0826 08/23/14  1000 08/23/14 1214 08/23/14 1351  BP: 144/52 177/64 157/65 138/54  Pulse: 79 99 95   Temp: 98.2 F (36.8 C)  98.6 F (37 C)   TempSrc: Oral  Oral   Resp: 26 23 17    Height:      Weight:      SpO2: 96% 97% 96%     GEN- The patient is elderly appearing, alert and oriented x 3 today.   Head- normocephalic, atraumatic Eyes-  Sclera clear, conjunctiva pink Ears- hearing intact Oropharynx- clear Neck- supple, Lungs- Clear to ausculation bilaterally, normal work of breathing Heart- Regular rate and rhythm, no murmurs, rubs or gallops, PMI not laterally displaced GI- soft, NT, ND, + BS Extremities- no clubbing, cyanosis, or edema, groin is without hematoma/ bruit MS- age appropriate atrophy Skin- no rash or lesion Psych- euthymic mood, full affect Neuro- strength and sensation are intact    Labs:   Lab Results  Component Value Date   WBC 8.9 08/22/2014   HGB 12.4 08/22/2014   HCT 37.3 08/22/2014   MCV 81.6 08/22/2014   PLT 273 08/22/2014    Recent Labs Lab 08/23/14 0157  NA 138  K 3.6*  CL 98  CO2 24  BUN 13  CREATININE 0.96  CALCIUM 9.3  GLUCOSE 104*     Radiology/Studies: Dg Chest Portable 1 View 08/22/2014   CLINICAL DATA:  Loss of consciousness. No chest pain. Hypertension.  EXAM: PORTABLE CHEST - 1 VIEW  COMPARISON:  07/19/2014 chest x-ray.  FINDINGS: No infiltrate, congestive heart failure or pneumothorax.  Mildly tortuous aorta.  Heart size top-normal.  IMPRESSION: No active disease.  Please see above.   Electronically Signed   By: Chauncey Cruel M.D.   On: 08/22/2014 11:58    EKG: reveals sinus rhythm 68 bpm, PR 185, QRS 91, Qtc 410  TELEMETRY: sinus  A/P 1. Transient complete heart block Documented on event monitor with associated syncope She has been on coreg.  This medicines has been discontinued.  She has had no further AV block. We will keep  her on telemetry.  I think that she could transfer out of TCU today. She may benefit from additional event monitoring on discharge.  Certainly should she have any further syncope or documented AV block, pacemaker implant would be appropriate.  2. Hypertensive cardiovascular disease Elevated BP off of coreg Continue hydralazine and maxzide Add lisinopril (particularly given h/o hypokalemia).  Continue to titrate lisinopril as needed.  Transfer to telemetry today

## 2014-08-23 NOTE — Progress Notes (Signed)
Utilization review completed.  

## 2014-08-24 NOTE — Discharge Summary (Signed)
ELECTROPHYSIOLOGY PROCEDURE DISCHARGE SUMMARY    Patient ID: Kimberly Silva,  MRN: [id number], DOB/AGE: [birth year] 78 y.o.  Admit date: 08/22/2014 Discharge date: 08/25/2014  Primary Care Physician: Lottie Dawson, MD Primary Cardiologist: Tamala Julian Electrophysiologist: Lovena Le  Primary Discharge Diagnosis:  Transient complete heart block with syncope resolved off of beta-blockers  Secondary Discharge Diagnosis:  1.  Hypertension 2.  Hyperlipidemia 3.  Parkinson's disease  No Known Allergies  Brief HPI/Hospital Course:  Kimberly Silva is a 80 y.o. female with a past medical history significant for hyperlipidemia, hypertension, Parkinson's disease, and prior syncope in August of this year. At that time, she was at home ([place] independent living) and had an episode where she "slumped over" in her chair. This was unwitnessed, and the patient is unsure if she had syncope or not. Her daughter was visiting later that day and she had an episode of altered level of consciousness with increased hand tremors. She was brought to [facility] for further evaluation. Telemetry demonstrated sinus bradycardia and her propranolol was discontinued. Event monitoring was arranged. After discharge, her blood pressure was elevated and she was evaluated by her PCP who started Coreg 6.25mg  twice daily.   On the day of admission, she was standing at the sink to brush her teeth when she developed a "funny feeling". She attempted to sit on the commode, but awoke on the floor. She called the holter monitoring company who advised her to come to the ER. Event monitor demonstrated several seconds of complete heart block at the same time of her symptoms. She was admitted and her Coreg has been held (last dose 9-7 am). She has had no further heart block on telemetry. She has been evaluated by electrophysiology.  There is no indication for permanent pacing with reversible cause for heart block.    She has been  examined by Dr Rayann Heman and considered stable for discharge to home.  She should avoid AV nodal blocking agents in the future.  She has been advised to continue wearing event monitor and to alert the office with further syncope/pre-syncope.    Discharge Vitals: Blood pressure 125/48, pulse 71, temperature 99.1 F (37.3 C), temperature source Oral, resp. rate 18, height 5\' 2"  (1.575 m), weight 128 lb (58.06 kg), SpO2 97.00%.  Physical Exam: Filed Vitals:   08/24/14 1349 08/24/14 2115 08/24/14 2221 08/25/14 0541  BP: 126/56 125/48    Pulse: 71     Temp: 98.3 F (36.8 C)  98.1 F (36.7 C) 99.1 F (37.3 C)  TempSrc: Oral Oral Oral Oral  Resp: 18 20  18   Height:      Weight:      SpO2: 99% 98%  97%    GEN- The patient is elderly appearing, alert and oriented x 3 today.   Head- normocephalic, atraumatic Eyes-  Sclera clear, conjunctiva pink Ears- hearing intact Oropharynx- clear Neck- supple, Lungs- Clear to ausculation bilaterally, normal work of breathing Heart- Regular rate and rhythm  GI- soft, NT, ND, + BS Extremities- no clubbing, cyanosis, or edema, groin is without hematoma/ bruit MS- age appropriate muscle atrophy Neuro- strength and sensation are intact   Labs:   Lab Results  Component Value Date   WBC 8.9 08/22/2014   HGB 12.4 08/22/2014   HCT 37.3 08/22/2014   MCV 81.6 08/22/2014   PLT 273 08/22/2014     Recent Labs Lab 08/23/14 0157  NA 138  K 3.6*  CL 98  CO2 24  BUN 13  CREATININE 0.96  CALCIUM 9.3  GLUCOSE 104*     Discharge Medications:    Medication List    STOP taking these medications       carvedilol 6.25 MG tablet  Commonly known as:  COREG      TAKE these medications       aspirin 81 MG tablet  Take 81 mg by mouth daily.     carbidopa-levodopa 25-100 MG per tablet  Commonly known as:  SINEMET IR  Take 0.5-1 tablets by mouth 3 (three) times daily. Take 1 tablet in the morning, 1/2 tablet at 4pm, and 1 tablet at bedtime.      cyanocobalamin 1000 MCG/ML injection  Commonly known as:  (VITAMIN B-12)  Inject 1 mL (1,000 mcg total) into the muscle every 30 (thirty) days.     fluticasone 50 MCG/ACT nasal spray  Commonly known as:  FLONASE  Place 2 sprays into both nostrils at bedtime.     hydrALAZINE 25 MG tablet  Commonly known as:  APRESOLINE  Take 1 tablet (25 mg total) by mouth every 8 (eight) hours.     lisinopril 10 MG tablet  Commonly known as:  PRINIVIL,ZESTRIL  Take 1 tablet (10 mg total) by mouth daily.     LORazepam 0.5 MG tablet  Commonly known as:  ATIVAN  Take 0.5 mg by mouth 2 (two) times daily as needed for anxiety.     Magnesium 250 MG Tabs  Take 250 mg by mouth daily.     pantoprazole 40 MG tablet  Commonly known as:  PROTONIX  Take 40 mg by mouth daily.     polyethylene glycol packet  Commonly known as:  MIRALAX / GLYCOLAX  Take 17 g by mouth every other day.     potassium chloride 10 MEQ tablet  Commonly known as:  K-DUR  Take 10 mEq by mouth 2 (two) times daily.     SYSTANE 0.4-0.3 % Soln  Generic drug:  Polyethyl Glycol-Propyl Glycol  Apply 1 drop to eye 2 (two) times daily.     triamterene-hydrochlorothiazide 37.5-25 MG per tablet  Commonly known as:  MAXZIDE-25  Take 1 tablet by mouth daily.     Vitamin D3 1000 UNITS Caps  Take 1,000 Units by mouth daily.        Disposition:   DC back to [facility].  Will need to continue event monitoring for an additional 30 days.  Duration of Discharge Encounter: Greater than 30 minutes including physician time.  Signed,   Thompson Grayer MD

## 2014-08-24 NOTE — Progress Notes (Signed)
Patient ID: TYNA HUERTAS, female   DOB: [birth year], 80 y.o.   MRN: [id number]   Patient Name: Kimberly Silva Date of Encounter: 08/24/2014     Principal Problem:   Syncope Active Problems:   HYPERLIPIDEMIA   HYPERTENSION   Complete heart block    SUBJECTIVE  "I feel nauseated and weak". No chest pain or sob.  CURRENT MEDS . aspirin EC  81 mg Oral Daily  . carbidopa-levodopa  0.5 tablet Oral Q24H  . carbidopa-levodopa  1 tablet Oral BID  . Chlorhexidine Gluconate Cloth  6 each Topical Q0600  . hydrALAZINE  25 mg Oral 3 times per day  . lisinopril  10 mg Oral Daily  . magnesium oxide  200 mg Oral Daily  . mupirocin ointment  1 application Nasal BID  . pantoprazole  40 mg Oral Daily  . polyethylene glycol  17 g Oral QODAY  . polyvinyl alcohol  1 drop Both Eyes BID  . potassium chloride  10 mEq Oral BID  . triamterene-hydrochlorothiazide  1 tablet Oral Daily    OBJECTIVE  Filed Vitals:   08/23/14 1751 08/23/14 1949 08/23/14 2105 08/24/14 0511  BP: 144/60 103/42 140/60 138/58  Pulse: 81 75    Temp: 98.5 F (36.9 C) 98.1 F (36.7 C)  98.5 F (36.9 C)  TempSrc: Oral Oral  Oral  Resp: 20 18  18   Height:      Weight:      SpO2: 97% 98%  97%    Intake/Output Summary (Last 24 hours) at 08/24/14 1248 Last data filed at 08/24/14 0830  Gross per 24 hour  Intake    360 ml  Output      0 ml  Net    360 ml   Filed Weights   08/22/14 1052  Weight: 128 lb (58.06 kg)    PHYSICAL EXAM  General: Pleasant, elderly woman, NAD. Neuro: Alert and oriented X 3. Moves all extremities spontaneously. Psych: Normal affect. HEENT:  Normal  Neck: Supple without bruits or JVD. Lungs:  Resp regular and unlabored, CTA. Heart: RRR no s3, s4, or murmurs. Abdomen: Soft, non-tender, non-distended, BS + x 4.  Extremities: No clubbing, cyanosis or edema. DP/PT/Radials 2+ and equal bilaterally.  Accessory Clinical Findings  CBC  Recent Labs  08/22/14 1101  WBC 8.9  HGB 12.4   HCT 37.3  MCV 81.6  PLT 528   Basic Metabolic Panel  Recent Labs  08/22/14 1101 08/22/14 2137 08/23/14 0157  NA 138  --  138  K 4.0  --  3.6*  CL 99  --  98  CO2 26  --  24  GLUCOSE 110*  --  104*  BUN 15  --  13  CREATININE 1.06  --  0.96  CALCIUM 9.5  --  9.3  MG  --  1.7  --    Liver Function Tests No results found for this basename: AST, ALT, ALKPHOS, BILITOT, PROT, ALBUMIN,  in the last 72 hours No results found for this basename: LIPASE, AMYLASE,  in the last 72 hours Cardiac Enzymes No results found for this basename: CKTOTAL, CKMB, CKMBINDEX, TROPONINI,  in the last 72 hours BNP No components found with this basename: POCBNP,  D-Dimer No results found for this basename: DDIMER,  in the last 72 hours Hemoglobin A1C No results found for this basename: HGBA1C,  in the last 72 hours Fasting Lipid Panel No results found for this basename: CHOL, HDL, LDLCALC, TRIG, CHOLHDL, LDLDIRECT,  in the last 72 hours Thyroid Function Tests No results found for this basename: TSH, T4TOTAL, FREET3, T3FREE, THYROIDAB,  in the last 72 hours  TELE  nsr  Radiology/Studies  Dg Chest Portable 1 View  08/22/2014   CLINICAL DATA:  Loss of consciousness. No chest pain. Hypertension.  EXAM: PORTABLE CHEST - 1 VIEW  COMPARISON:  07/19/2014 chest x-ray.  FINDINGS: No infiltrate, congestive heart failure or pneumothorax.  Mildly tortuous aorta.  Heart size top-normal.  IMPRESSION: No active disease.  Please see above.   Electronically Signed   By: Chauncey Cruel M.D.   On: 08/22/2014 11:58    ASSESSMENT AND PLAN 1. Syncope -  No additional episodes since coming off of beta blockers 2. Documented asystole on cardiac monitor in the setting of beta blocker therapy - we will continue to hold off on these drugs.  3. Nausea and weakness - she is starting to feel better. Will watch today, and give anti-nausea meds. Hopefully she can go home tomorrow.    Dorrie Cocuzza,M.D.  08/24/2014 12:48 PM

## 2014-08-25 ENCOUNTER — Encounter: Payer: Self-pay | Admitting: *Deleted

## 2014-08-25 ENCOUNTER — Telehealth: Payer: Self-pay | Admitting: Interventional Cardiology

## 2014-08-25 DIAGNOSIS — I442 Atrioventricular block, complete: Secondary | ICD-10-CM

## 2014-08-25 MED ORDER — HYDRALAZINE HCL 25 MG PO TABS: 25.0000 mg | ORAL_TABLET | Freq: Three times a day (TID) | ORAL | Status: DC

## 2014-08-25 MED ORDER — LISINOPRIL 10 MG PO TABS: 10.0000 mg | ORAL_TABLET | Freq: Every day | ORAL | Status: DC

## 2014-08-25 MED ORDER — ONDANSETRON HCL 4 MG PO TABS: 4.0000 mg | ORAL_TABLET | Freq: Three times a day (TID) | ORAL | Status: DC | PRN

## 2014-08-25 NOTE — Care Management Note (Signed)
    Page 1 of 1   08/25/2014     4:01:01 PM CARE MANAGEMENT NOTE 08/25/2014  Patient:  Kimberly Silva, Kimberly Silva   Account Number:  [id number]  Date Initiated:  08/23/2014  Documentation initiated by:  Elissa Hefty  Subjective/Objective Assessment:   adm w syncope     Action/Plan:   lives at [facility]   Anticipated DC Date:  08/25/2014   Anticipated DC Plan:  ASSISTED LIVING / [facility]  In-house referral  Clinical Social Worker         Choice offered to / List presented to:             Status of service:  Completed, signed off Medicare Important Message given?  YES (If response is "NO", the following Medicare IM given date fields will be blank) Date Medicare IM given:  08/25/2014 Medicare IM given by:  Leonela Kivi Date Additional Medicare IM given:   Additional Medicare IM given by:    Discharge Disposition:  ASSISTED LIVING  Per UR Regulation:  Reviewed for med. necessity/level of care/duration of stay  If discussed at [place] of Stay Meetings, dates discussed:    Comments:  08/25/14 Ellan Lambert, RN, BSN [phone] Pt adm from [facility].  Plan dc today to ALF at [facility], per CSW arrangements.

## 2014-08-25 NOTE — Progress Notes (Signed)
Pt ambulated in hallway with RW on room air.  Walked approximately 100 feet without any SOB or distress., returned to room and resting with call bell within reach. Payton Emerald, RN

## 2014-08-25 NOTE — Progress Notes (Signed)
Report given to [facility].

## 2014-08-25 NOTE — Clinical Social Work Note (Signed)
Met with patient and daughter to discuss dc plans- we have confirmed an ALF bed for her at [facility] and can be d/c'ed there today if medically ready- awaiting callback from medical team.  Eduard Clos, MSW, [place]

## 2014-08-25 NOTE — Telephone Encounter (Signed)
New message      They received an extention for 30 days for monitor. Current order ends 09-07-14.  Do you want to extend 30 more days after the 23rd or not extend order.  They need to know if patient still has the monitor?

## 2014-08-26 NOTE — Telephone Encounter (Signed)
Pt never seen by Dr.Smith pt see @ [facility] by Dr's Taylor/ and Allred routed to their nurse Claiborne Billings, RN for adv

## 2014-08-30 ENCOUNTER — Telehealth: Payer: Self-pay | Admitting: Internal Medicine

## 2014-08-30 DIAGNOSIS — L609 Nail disorder, unspecified: Secondary | ICD-10-CM

## 2014-08-30 NOTE — Telephone Encounter (Signed)
Ok to do podiatry referral. miralax qd and  Lorazepam  Prn  bid  For  Anxiety   With caution dependent producing . Can disp 30 no refill  If  needed Eventually  need ov to  Evaluated the medication since post hospital and  bp s

## 2014-08-30 NOTE — Telephone Encounter (Signed)
Patient Information:  Caller Name: Lesleigh Noe  Phone: [phone]  Patient: Kimberly Silva, Kimberly Silva  Gender: Female  DOB: November 02, 1926  Age: 80 Years  PCP: Shanon Ace (Family Practice)  Office Follow Up:  Does the office need to follow up with this patient?: Yes  Instructions For The Office: Please see sxs below for daughter's questions for Dr Regis Bill   Symptoms  Reason For Call & Symptoms: Daughter is calling with a few 'FYI' information for Dr Regis Bill as well as requests.     Daughter wanted to let Dr Pecolia Ades know that pt had a syncopal episode while on holter monitor and was taken to ER, showed a 15 second pause.  They decided most likely due to Coreg, so it was d/c'ed and pt is now on Maxide, Hydralazine, Zestril.   Holter monitor is still on and pt is back at [facility].  Pt is on Ativan qd prn. daughter and nursing staff are wanting to know if ok to use BID prn due to increased anxiety/nervousness which in turn increases sxs..   It has been very helpful for her so far.    Also, pt's Miralax was ordered once a day, pt changed it to QOD herself and told hospital this upon admission so at discharge they ordered it QOD, requesting to have it changed back to Qday.  Daughter is also requesting a referral to a podiatrist for foot/nail care. Pt does not like the one at [facility].  If there are any questions for nurses at [facility], their names are Jonelle Sidle or Deloria Lair and pt is on [employer].   For any medication changes, new Rx's will need to be sent to [facility].  Reviewed Health History In EMR: N/A  Reviewed Medications In EMR: N/A  Reviewed Allergies In EMR: N/A  Reviewed Surgeries / Procedures: N/A  Date of Onset of Symptoms: 08/30/2014  Guideline(s) Used:  No Protocol Available - Sick Adult  Disposition Per Guideline:   Discuss with PCP and Callback by Nurse Today  Reason For Disposition Reached:   Nursing judgment  Advice Given:  N/A  Patient Will Follow Care Advice:  YES

## 2014-08-30 NOTE — Telephone Encounter (Signed)
No DPR for Margie.  LMOM for Webb Silversmith (daughter) to return my call.

## 2014-08-30 NOTE — Telephone Encounter (Signed)
Discussed with Dr Rayann Heman will extend for 2 more weeks Called the company back and they are aware

## 2014-09-02 NOTE — Telephone Encounter (Signed)
Left a message for Lelon Frohlich informing her that orders have been faxed to the nurse for the pt.  Referral placed for podiatry.  If further assistance is needed than I requested she call back.  1. Lorazepam to take bid prn for anxiety 2. Miralax to take every day

## 2014-09-07 ENCOUNTER — Encounter: Payer: Self-pay | Admitting: Internal Medicine

## 2014-09-07 ENCOUNTER — Telehealth: Payer: Self-pay | Admitting: Internal Medicine

## 2014-09-07 NOTE — Telephone Encounter (Signed)
New Message  Tiffany with [facility] called to discuss potential side effects for Hydralazine.. And correct dosage. Please call back to discuss.

## 2014-09-07 NOTE — Telephone Encounter (Addendum)
Called number x 3 and a message comes on after one ring saying the person you are trying to call is unavailable right now, then speaks in [place].

## 2014-09-07 NOTE — Telephone Encounter (Signed)
This encounter was created in error - please disregard.

## 2014-09-07 NOTE — Telephone Encounter (Signed)
°  Pt's daughter is calling with questions about medication. Please call and advise

## 2014-09-08 ENCOUNTER — Telehealth: Payer: Self-pay

## 2014-09-08 NOTE — Telephone Encounter (Signed)
Called Tiffany in response to a fax received with medication questions concerning Ms. Murren. Message left to call back.

## 2014-09-08 NOTE — Telephone Encounter (Signed)
Returned call to [employer].  Confirmed with Lelon Frohlich the MD order for hydralazine: 25mg  tablet every 8 hours. Ann expressed concern about taking it every 8 hours since her mother sleeps from 2100 to 0800. We made a schedule, 0800, 1400, and 2000 for her to take her hydralazine so to not disrupt sleep. Ann conveyed concern about hydralazine making Mrs. Ortez's heart race (ranging from 70-110). Ann st that pill bottle st that hydralazine can increase heart rate while patient is getting used to it. Delton See that if Ms. Befort's HR sustains >110 or becomes symptomatic to call the office.  Also advised Lelon Frohlich to continue monitoring Ms. Shellhammer's vital signs, and not to take the medication if she is hypotensive.

## 2014-09-08 NOTE — Telephone Encounter (Signed)
Tiffany returned call. We made a schedule, 0800, 1400, and 2000 for Ms. Mcalexander to take her hydralazine so to not disrupt sleep. Advised Tiffany that if Ms. Decarlo's HR sustains >110 or becomes symptomatic to call the office.  Also advised Tiffany to continue monitoring Ms. Mcmackin's vital signs, and not to take the medication if she is hypotensive. Faxed this information to [place] for her records.

## 2014-09-08 NOTE — Telephone Encounter (Signed)
pls call daughter at number listed, she had left message yesterday and never received a cb.  [facility] is [facility]. They have been calling each other to see if anyone has heard from you. Pls call asap.

## 2014-09-13 ENCOUNTER — Telehealth: Payer: Self-pay | Admitting: Internal Medicine

## 2014-09-13 ENCOUNTER — Other Ambulatory Visit: Payer: Self-pay | Admitting: Internal Medicine

## 2014-09-13 NOTE — Telephone Encounter (Signed)
Spoke with the patient's daughter and she does not want to stop the Hydralazine only wants to hold the 1400 dose.  We will hold the 1400 dose and if she continues to feel "funny" then we can stop all together.  If she does decide to stop, will need daily BP's and if systolic BP is >287 call the office   Let Kimberly Silva know I would call Tiffany tomorrow at [facility] with recomendations

## 2014-09-13 NOTE — Telephone Encounter (Signed)
New message      Talk to [facility].  Have questions about a monitor patient is wearing.

## 2014-09-13 NOTE — Telephone Encounter (Signed)
New problem   Pt's daughter need to speak to nurse concerning pt being taken completely off of Hydralazine. Pt feels strange after taken medications. Please call pt's daughter

## 2014-09-14 ENCOUNTER — Telehealth: Payer: Self-pay | Admitting: *Deleted

## 2014-09-14 MED ORDER — HYDRALAZINE HCL 25 MG PO TABS: 25.0000 mg | ORAL_TABLET | Freq: Two times a day (BID) | ORAL | Status: DC

## 2014-09-14 NOTE — Telephone Encounter (Signed)
rder with current instructions faxed to [facility]

## 2014-09-16 NOTE — Telephone Encounter (Signed)
Spoke with pt and per Dr Rayann Heman (Ref to other telephone note) monitor was only suppose to be extended for 2 weeks

## 2014-09-16 NOTE — Telephone Encounter (Signed)
Follow up    Spoke with Juliann Pulse on yesterday has upcoming  clarification on her mother heart monitor

## 2014-09-19 ENCOUNTER — Telehealth: Payer: Self-pay | Admitting: Internal Medicine

## 2014-09-19 ENCOUNTER — Ambulatory Visit: Payer: Medicare Other | Admitting: Interventional Cardiology

## 2014-09-19 ENCOUNTER — Encounter: Payer: Self-pay | Admitting: Internal Medicine

## 2014-09-19 NOTE — Telephone Encounter (Signed)
Ok to refill x 3 

## 2014-09-19 NOTE — Telephone Encounter (Signed)
[facility] following up on meds request.pt has one pill left for today.

## 2014-09-19 NOTE — Telephone Encounter (Signed)
New message   Daughter calling - her mother has appt with Dr. Lovena Le 10/29 wants appt move up  .Marland Kitchen C/O last three day gotten lightheadness,  On hydralazine  25 mg .    Daughter stating this appt was suppose to be post hospital visit. Which it will be a month out.

## 2014-09-19 NOTE — Telephone Encounter (Signed)
Has an appointment tomorrow.

## 2014-09-19 NOTE — Telephone Encounter (Signed)
Called to the pharmacy and left on voicemail. 

## 2014-09-19 NOTE — Telephone Encounter (Signed)
Kimberly Silva will call daughter.  Dr Lovena Le has an opening for tomorrow

## 2014-09-19 NOTE — Telephone Encounter (Signed)
New message    Daughter calling wants to know about am medication  - hydralazine 25 mg. How should she proceed.

## 2014-09-19 NOTE — Telephone Encounter (Signed)
Please call rx into pharm today

## 2014-09-19 NOTE — Telephone Encounter (Signed)
Pt called about the below re-fill request. She will run out tomorrow.

## 2014-09-20 ENCOUNTER — Ambulatory Visit (INDEPENDENT_AMBULATORY_CARE_PROVIDER_SITE_OTHER): Payer: Medicare Other | Admitting: Internal Medicine

## 2014-09-20 ENCOUNTER — Encounter: Payer: Self-pay | Admitting: Internal Medicine

## 2014-09-20 ENCOUNTER — Telehealth: Payer: Self-pay | Admitting: Internal Medicine

## 2014-09-20 VITALS — BP 150/72 | HR 104 | Ht 62.0 in | Wt 124.6 lb

## 2014-09-20 DIAGNOSIS — I442 Atrioventricular block, complete: Secondary | ICD-10-CM

## 2014-09-20 DIAGNOSIS — I10 Essential (primary) hypertension: Secondary | ICD-10-CM

## 2014-09-20 MED ORDER — HYDRALAZINE HCL 10 MG PO TABS: 10.0000 mg | ORAL_TABLET | Freq: Two times a day (BID) | ORAL | Status: DC

## 2014-09-20 NOTE — Patient Instructions (Signed)
Your physician has recommended you make the following change in your medication:  1.) hydralazine 10 mg twice a day  Your physician recommends that you schedule a follow-up appointment as scheduled.

## 2014-09-20 NOTE — Assessment & Plan Note (Signed)
Her blood pressures have been somewhat labile. She has had low blood pressure and high blood pressure on hydralazine. I am more concerned about the episodes of low blood pressure. I've recommended that she reduce her dose of hydralazine to 10 mg twice daily. It is my expectation that her pressure will be on the high side, but I believe this is better than living with low blood pressure and having frequent syncope which she is at increased risk of, particularly in the setting of Parkinson's disease.

## 2014-09-20 NOTE — Progress Notes (Signed)
HPI Kimberly Silva returns today for followup. She is a very pleasant 80 year old woman with Parkinson's disease, who was found to have symptomatic bradycardia while on beta blocker therapy. Her beta blocker was discontinued. She were cardiac monitor for several weeks. We currently do not have some of her data, but she did have episodes of intermittent heart block which appear to have occurred while sleeping, and off of her medical therapy. She has no recurrent syncopal episodes. The patient feels poorly at times. She notes lightheadedness after taking hydralazine which will last up to 3 hours. At other times, her hydralazine does not make her feel bad. Her Parkinson's disease has been fairly well-controlled. No Known Allergies   Current Outpatient Prescriptions  Medication Sig Dispense Refill  . aspirin 81 MG tablet Take 81 mg by mouth daily.        . carbidopa-levodopa (SINEMET IR) 25-100 MG per tablet Take 0.5-1 tablets by mouth 3 (three) times daily. Take 1 tablet in the morning, 1/2 tablet at 12, 1 tablet at before dinner      . Cholecalciferol (VITAMIN D3) 1000 UNITS CAPS Take 1,000 Units by mouth daily.       . cyanocobalamin (,VITAMIN B-12,) 1000 MCG/ML injection Inject 1 mL (1,000 mcg total) into the muscle every 30 (thirty) days.  10 mL  0  . fluticasone (FLONASE) 50 MCG/ACT nasal spray Place 2 sprays into both nostrils at bedtime.      . hydrALAZINE (APRESOLINE) 25 MG tablet Take 1 tablet (25 mg total) by mouth 2 (two) times daily.  90 tablet  1  . lisinopril (PRINIVIL,ZESTRIL) 10 MG tablet Take 1 tablet (10 mg total) by mouth daily.  30 tablet  1  . LORazepam (ATIVAN) 0.5 MG tablet TAKE 1/2 TO 1 TABLET TWICE DAILY AS NEEDED FOR ANXIETY.  30 tablet  2  . Magnesium 250 MG TABS Take 250 mg by mouth daily.       . pantoprazole (PROTONIX) 40 MG tablet Take 40 mg by mouth daily.      Kimberly Silva Glycol-Propyl Glycol (SYSTANE) 0.4-0.3 % SOLN Apply 1 drop to eye 2 (two) times daily.         . polyethylene glycol (MIRALAX / GLYCOLAX) packet Take 17 g by mouth every other day.       . potassium chloride (K-DUR) 10 MEQ tablet Take 10 mEq by mouth 2 (two) times daily.       Marland Kitchen triamterene-hydrochlorothiazide (MAXZIDE-25) 37.5-25 MG per tablet Take 1 tablet by mouth daily.       No current facility-administered medications for this visit.     Past Medical History  Diagnosis Date  . HYPERLIPIDEMIA 02/21/2009  . HYPOKALEMIA 08/17/2007  . ANEMIA, B12 DEFICIENCY 03/13/2009  . HYPERTENSION 05/14/2007  . Blood in stool 01/25/2008  . OSTEOARTHRITIS 05/14/2007  . OSTEOPENIA 05/14/2007    2007 hx of fosamax use   . TREMOR 07/08/2007  . FREQUENCY, URINARY 08/19/2008  . LUMBAR STRAIN 09/21/2007  . Gastric polyp     panendo  5 2009  . History of ankle fracture   . Parkinson's disease   . Adenomatous colon polyp     ROS:   All systems reviewed and negative except as noted in the HPI.   Past Surgical History  Procedure Laterality Date  . Abdominal hysterectomy    . Fracture  2007    left ankle- Hx of fosamax use  . Dilation and curettage of uterus    .  Cataract extraction, bilateral    . Colonoscopy N/A 07/19/2013    Procedure: COLONOSCOPY;  Surgeon: Lafayette Dragon, MD;  Location: [facility];  Service: Endoscopy;  Laterality: N/A;     Family History  Problem Relation Age of Onset  . Heart attack Mother   . Coronary artery disease Mother   . Colon cancer Sister     x 2  . Breast cancer Sister     x 4  . Parkinsonism Brother   . Peripheral vascular disease      sibling  . Stroke Father   . Colon cancer Other     nephew  . Colon cancer Paternal Aunt   . Breast cancer Sister   . Breast cancer Sister      History   Social History  . Marital Status: Widowed    Spouse Name: N/A    Number of Children: 39  . Years of Education: BA   Occupational History  . Retired     Pharmacist, hospital   Social History Main Topics  . Smoking status: Never Smoker   . Smokeless tobacco: Never  Used  . Alcohol Use: No  . Drug Use: No  . Sexual Activity: Not on file   Other Topics Concern  . Not on file   Social History Narrative   Husband died in [place] in 03/22/08 of [facility].   [facility]   G7P5   Caffeine Use: 1 soda daily     BP 150/72  Pulse 104  Ht 5\' 2"  (1.575 m)  Wt 124 lb 9.6 oz (56.518 kg)  BMI 22.78 kg/m2  Physical Exam:  Well appearing elderly woman, NAD HEENT: Unremarkable Neck:  No JVD, no thyromegally Back:  No CVA tenderness Lungs:  Clear with no wheezes, rales, or rhonchi. HEART:  Regular rate rhythm, no murmurs, no rubs, no clicks Abd:  soft, positive bowel sounds, no organomegally, no rebound, no guarding Ext:  2 plus pulses, no edema, no cyanosis, no clubbing Skin:  No rashes no nodules Neuro:  CN II through XII intact, motor grossly intact   Cardiac monitoring - cardiac monitoring has been reviewed today. She has sinus rhythm with periods of heart block occurring at nighttime presumably while sleeping.   Assess/Plan:

## 2014-09-20 NOTE — Telephone Encounter (Signed)
New message    Daughter calling regarding am medication hydralazine 25 mg should she take it or not.  Blood pressure this am  169 / ? .    Asking for a called this am

## 2014-09-20 NOTE — Telephone Encounter (Signed)
Ann called to ask if her mom should take her hydralazine.  She reported blood pressure almost at 170. Instructed her to have her mom take the medication, that her BP needs to be treated. She st that her mom gets a little lightheaded after she takes it, but this morning she is already a little lightheaded. Since the patient has an appointment this afternoon, I instructed her to take her other blood pressure medications to treat the BP and see if it comes down without the hydralazine. Informed Ann I would call her mother and speak to her personally for more details and direction.

## 2014-09-20 NOTE — Telephone Encounter (Signed)
Called patient to ask how she is feeling. Patient states that she already took her hydralazine and does not feel lightheaded yet. Instructed patient to call the office if she has any further questions and told her we would see her this afternoon.

## 2014-09-20 NOTE — Assessment & Plan Note (Signed)
This occurred on beta blocker therapy, and only persistent nighttime for which she is asymptomatic. She will undergo watchful waiting. I suspect she will ultimately demonstrate symptomatic bradycardia secondary to heart block and require permanent pacemaker insertion.

## 2014-09-22 ENCOUNTER — Ambulatory Visit: Payer: Medicare Other | Admitting: Interventional Cardiology

## 2014-09-26 ENCOUNTER — Encounter: Payer: Self-pay | Admitting: Interventional Cardiology

## 2014-10-05 ENCOUNTER — Encounter: Payer: Medicare Other | Admitting: Internal Medicine

## 2014-10-13 ENCOUNTER — Ambulatory Visit (INDEPENDENT_AMBULATORY_CARE_PROVIDER_SITE_OTHER): Payer: Medicare Other | Admitting: Internal Medicine

## 2014-10-13 ENCOUNTER — Encounter: Payer: Medicare Other | Admitting: Internal Medicine

## 2014-10-13 ENCOUNTER — Encounter: Payer: Self-pay | Admitting: Internal Medicine

## 2014-10-13 VITALS — BP 150/80 | HR 109 | Ht 62.0 in | Wt 119.0 lb

## 2014-10-13 DIAGNOSIS — I10 Essential (primary) hypertension: Secondary | ICD-10-CM

## 2014-10-13 DIAGNOSIS — R55 Syncope and collapse: Secondary | ICD-10-CM

## 2014-10-13 NOTE — Patient Instructions (Signed)
Your physician wants you to follow-up in: 6 months with Dr Taylor You will receive a reminder letter in the mail two months in advance. If you don't receive a letter, please call our office to schedule the follow-up appointment.  

## 2014-10-13 NOTE — Assessment & Plan Note (Signed)
The patient has 2 reasons to have syncope. One reason would be intermittent complete heart block. This appears to have resolved with discontinuation of her beta blocker. The second reason would be lability of blood pressure secondary to her Parkinson's disease. Most of these patients have some degree of autonomic dysfunction. I've recommended that the patient be allowed to keep her blood pressure on the high side. Hopefully she will out no recurrent syncope.

## 2014-10-13 NOTE — Progress Notes (Signed)
HPI Kimberly Silva returns today for followup. She is a very pleasant 80 year old woman with Parkinson's disease, who was found to have symptomatic bradycardia while on beta blocker therapy. Her beta blocker was discontinued. She wore a cardiac monitor for several weeks. She has no recurrent syncopal episodes. The patient feels poorly at times. She has had lability of her blood pressure. This is thought secondary to her Parkinson's disease. She notes lightheadedness after taking hydralazine which will last up to 3 hours. When she wakes up in the morning, she will note that her blood pressure may be up in the 170-180 range. Otherwise, her Parkinson's disease has been fairly well-controlled. No Known Allergies   Current Outpatient Prescriptions  Medication Sig Dispense Refill  . aspirin 81 MG tablet Take 81 mg by mouth daily.        . carbidopa-levodopa (SINEMET IR) 25-100 MG per tablet Take 0.5-1 tablets by mouth 3 (three) times daily. Take 1 tablet in the morning, 1/2 tablet at 12, 1 tablet at before dinner      . Cholecalciferol (VITAMIN D3) 1000 UNITS CAPS Take 1,000 Units by mouth daily.       . cyanocobalamin (,VITAMIN B-12,) 1000 MCG/ML injection Inject 1 mL (1,000 mcg total) into the muscle every 30 (thirty) days.  10 mL  0  . fluticasone (FLONASE) 50 MCG/ACT nasal spray Place 2 sprays into both nostrils at bedtime.      . hydrALAZINE (APRESOLINE) 10 MG tablet Take 1 tablet (10 mg total) by mouth 2 (two) times daily.  90 tablet  3  . HYDROcodone-acetaminophen (NORCO/VICODIN) 5-325 MG per tablet Take as directed for back pain      . lisinopril (PRINIVIL,ZESTRIL) 10 MG tablet Take 1 tablet (10 mg total) by mouth daily.  30 tablet  1  . LORazepam (ATIVAN) 0.5 MG tablet TAKE 1/2 TO 1 TABLET TWICE DAILY AS NEEDED FOR ANXIETY.  30 tablet  2  . Magnesium 250 MG TABS Take 250 mg by mouth daily.       . pantoprazole (PROTONIX) 40 MG tablet Take 40 mg by mouth daily.      Kimberly Silva  Glycol-Propyl Glycol (SYSTANE) 0.4-0.3 % SOLN Apply 1 drop to eye 2 (two) times daily.       . polyethylene glycol (MIRALAX / GLYCOLAX) packet Take 17 g by mouth every other day.       . potassium chloride (K-DUR) 10 MEQ tablet Take 10 mEq by mouth 2 (two) times daily.       Kimberly Silva triamterene-hydrochlorothiazide (MAXZIDE-25) 37.5-25 MG per tablet Take 1 tablet by mouth daily.       No current facility-administered medications for this visit.     Past Medical History  Diagnosis Date  . HYPERLIPIDEMIA 02/21/2009  . HYPOKALEMIA 08/17/2007  . ANEMIA, B12 DEFICIENCY 03/13/2009  . HYPERTENSION 05/14/2007  . Blood in stool 01/25/2008  . OSTEOARTHRITIS 05/14/2007  . OSTEOPENIA 05/14/2007    2007 hx of fosamax use   . TREMOR 07/08/2007  . FREQUENCY, URINARY 08/19/2008  . LUMBAR STRAIN 09/21/2007  . Gastric polyp     panendo  5 2009  . History of ankle fracture   . Parkinson's disease   . Adenomatous colon polyp     ROS:   All systems reviewed and negative except as noted in the HPI.   Past Surgical History  Procedure Laterality Date  . Abdominal hysterectomy    . Fracture  2007    left ankle-  Hx of fosamax use  . Dilation and curettage of uterus    . Cataract extraction, bilateral    . Colonoscopy N/A 07/19/2013    Procedure: COLONOSCOPY;  Surgeon: Lafayette Dragon, MD;  Location: [facility];  Service: Endoscopy;  Laterality: N/A;     Family History  Problem Relation Age of Onset  . Heart attack Mother   . Coronary artery disease Mother   . Colon cancer Sister     x 2  . Breast cancer Sister     x 4  . Parkinsonism Brother   . Peripheral vascular disease      sibling  . Stroke Father   . Colon cancer Other     nephew  . Colon cancer Paternal Aunt   . Breast cancer Sister   . Breast cancer Sister      History   Social History  . Marital Status: Widowed    Spouse Name: N/A    Number of Children: 32  . Years of Education: BA   Occupational History  . Retired     Pharmacist, hospital    Social History Main Topics  . Smoking status: Never Smoker   . Smokeless tobacco: Never Used  . Alcohol Use: No  . Drug Use: No  . Sexual Activity: Not on file   Other Topics Concern  . Not on file   Social History Narrative   Husband died in [place] in 03-20-08 of [facility].   [facility]   G7P5   Caffeine Use: 1 soda daily     BP 150/80  Pulse 109  Ht 5\' 2"  (1.575 m)  Wt 119 lb (53.978 kg)  BMI 21.76 kg/m2  Physical Exam:  Well appearing elderly woman, NAD HEENT: Unremarkable Neck:  6 cm JVD, no thyromegally Back:  No CVA tenderness Lungs:  Clear with no wheezes, rales, or rhonchi. HEART:  Regular rate rhythm, no murmurs, no rubs, no clicks Abd:  soft, positive bowel sounds, no organomegally, no rebound, no guarding Ext:  2 plus pulses, no edema, no cyanosis, no clubbing Skin:  No rashes no nodules Neuro:  CN II through XII intact, motor grossly intact   Cardiac monitoring - cardiac monitoring has been reviewed today. She has sinus rhythm with periods of heart block, with no recurrent heart block with discontinuation of her beta blocker therapy.  Assess/Plan:

## 2014-10-13 NOTE — Assessment & Plan Note (Signed)
Her blood pressure remains elevated most of the time. However because of lability in her blood pressure secondary to autonomic dysfunction, I recommended that she her blood pressure be allowed to remain elevated. Hopefully this will result in less syncope.

## 2014-10-17 ENCOUNTER — Other Ambulatory Visit: Payer: Self-pay | Admitting: Internal Medicine

## 2014-10-17 ENCOUNTER — Encounter: Payer: Self-pay | Admitting: Internal Medicine

## 2014-10-19 ENCOUNTER — Encounter: Payer: Self-pay | Admitting: Interventional Cardiology

## 2014-10-19 ENCOUNTER — Other Ambulatory Visit: Payer: Self-pay | Admitting: *Deleted

## 2014-11-08 ENCOUNTER — Encounter: Payer: Self-pay | Admitting: Internal Medicine

## 2014-11-08 ENCOUNTER — Ambulatory Visit (INDEPENDENT_AMBULATORY_CARE_PROVIDER_SITE_OTHER): Payer: Medicare Other | Admitting: Internal Medicine

## 2014-11-08 VITALS — BP 136/56 | Temp 97.9°F | Ht 62.0 in | Wt 120.2 lb

## 2014-11-08 DIAGNOSIS — M79604 Pain in right leg: Secondary | ICD-10-CM

## 2014-11-08 DIAGNOSIS — G20C Parkinsonism, unspecified: Secondary | ICD-10-CM

## 2014-11-08 DIAGNOSIS — M545 Low back pain, unspecified: Secondary | ICD-10-CM

## 2014-11-08 DIAGNOSIS — I10 Essential (primary) hypertension: Secondary | ICD-10-CM

## 2014-11-08 DIAGNOSIS — F4322 Adjustment disorder with anxiety: Secondary | ICD-10-CM

## 2014-11-08 DIAGNOSIS — G20 Parkinson's disease: Secondary | ICD-10-CM

## 2014-11-08 DIAGNOSIS — Z79899 Other long term (current) drug therapy: Secondary | ICD-10-CM

## 2014-11-08 LAB — BASIC METABOLIC PANEL
BUN: 19 mg/dL (ref 6–23)
CO2: 25 mEq/L (ref 19–32)
CREATININE: 1.2 mg/dL (ref 0.4–1.2)
Calcium: 9.3 mg/dL (ref 8.4–10.5)
Chloride: 99 mEq/L (ref 96–112)
GFR: 47.49 mL/min — AB (ref >60.00)
Glucose, Bld: 103 mg/dL — ABNORMAL HIGH (ref 70–99)
Potassium: 3.7 mEq/L (ref 3.5–5.1)
Sodium: 136 mEq/L (ref 135–145)

## 2014-11-08 LAB — MAGNESIUM: Magnesium: 1.8 mg/dL (ref 1.5–2.5)

## 2014-11-08 MED ORDER — LORAZEPAM 0.5 MG PO TABS: ORAL_TABLET | ORAL | Status: DC

## 2014-11-08 NOTE — Progress Notes (Signed)
Pre visit review using our clinic review tool, if applicable. No additional management support is needed unless otherwise documented below in the visit note.  Chief Complaint  Patient presents with  . Follow-up    HPI: Kimberly Silva 80 y.o. for Chronic disease management comes with her daughter today but is having problems and "not doing well". After hospitalization for recurrent syncope and rehabilitation she developed severe back pain right and right leg pain. Has been seeing the orthopedist and specialist currently on  hydrocodone for back  Tid  To help q 6  pain   Had mri  Nov 12 and 18th   To be seen dec 8th .   Hx of left injection with help in the left back But this time right  Leg  Pain at pt with lkeg raising   Right leg .    With the ativan was only taking in day and now taking some.  About 10 am . And at night with hydralazine . And sinemet.   One daughter suggest whether she should be on an antidepressant question Cymbalta question gabapentin. She is feeling more stressed and anxious. Her blood pressures can be normal in the 1:30 range pulses in the 80s 90s and then will shoot up into the 180s. She has had no more syncope is not allowed to take any beta blockers her tremors are somewhat worse but is unsure if it's from that or anxiety or the process of the tremor. Has noticed more lability of her blood pressure since being on the hydralazine at night.  ROS: See pertinent positives and negatives per HPI. No current chest pain shortness of breath edema. No unusual bleeding.  Past Medical History  Diagnosis Date  . HYPERLIPIDEMIA 02/21/2009  . HYPOKALEMIA 08/17/2007  . ANEMIA, B12 DEFICIENCY 03/13/2009  . HYPERTENSION 05/14/2007  . Blood in stool 01/25/2008  . OSTEOARTHRITIS 05/14/2007  . OSTEOPENIA 05/14/2007    04-11-2006 hx of fosamax use   . TREMOR 07/08/2007  . FREQUENCY, URINARY 08/19/2008  . LUMBAR STRAIN 09/21/2007  . Gastric polyp     panendo  5 April 11, 2008  . History of ankle  fracture   . Parkinson's disease   . Adenomatous colon polyp     Family History  Problem Relation Age of Onset  . Heart attack Mother   . Coronary artery disease Mother   . Colon cancer Sister     x 2  . Breast cancer Sister     x 4  . Parkinsonism Brother   . Peripheral vascular disease      sibling  . Stroke Father   . Colon cancer Other     nephew  . Colon cancer Paternal Aunt   . Breast cancer Sister   . Breast cancer Sister     History   Social History  . Marital Status: Widowed    Spouse Name: N/A    Number of Children: 30  . Years of Education: BA   Occupational History  . Retired     Pharmacist, hospital   Social History Main Topics  . Smoking status: Never Smoker   . Smokeless tobacco: Never Used  . Alcohol Use: No  . Drug Use: No  . Sexual Activity: None   Other Topics Concern  . None   Social History Narrative   Husband died in [place] in 04/11/2008 of [facility].   [facility]   [id number]   Caffeine Use:  1 soda daily    Outpatient Encounter Prescriptions as of 11/08/2014  Medication Sig  . aspirin 81 MG tablet Take 81 mg by mouth daily.    . carbidopa-levodopa (SINEMET IR) 25-100 MG per tablet Take 0.5-1 tablets by mouth 3 (three) times daily. Take 1 tablet in the morning, 1/2 tablet at 12, 1 tablet at before dinner  . Cholecalciferol (VITAMIN D3) 1000 UNITS CAPS Take 1,000 Units by mouth daily.   . cyanocobalamin (,VITAMIN B-12,) 1000 MCG/ML injection Inject 1 mL (1,000 mcg total) into the muscle every 30 (thirty) days.  . fluticasone (FLONASE) 50 MCG/ACT nasal spray Place 2 sprays into both nostrils at bedtime.  . hydrALAZINE (APRESOLINE) 10 MG tablet Take 1 tablet (10 mg total) by mouth 2 (two) times daily.  Marland Kitchen HYDROcodone-acetaminophen (NORCO/VICODIN) 5-325 MG per tablet Take as directed for back pain  . lisinopril (PRINIVIL,ZESTRIL) 10 MG tablet TAKE 1 TABLET ONCE DAILY.  Marland Kitchen LORazepam (ATIVAN) 0.5 MG tablet TAKE  1/2 TO 1 TABLET TWICE DAILY AS NEEDED FOR ANXIETY.  . Magnesium 250 MG TABS Take 250 mg by mouth daily.   . pantoprazole (PROTONIX) 40 MG tablet Take 40 mg by mouth daily.  [name] Glycol-Propyl Glycol (SYSTANE) 0.4-0.3 % SOLN Apply 1 drop to eye 2 (two) times daily.   . polyethylene glycol (MIRALAX / GLYCOLAX) packet Take 17 g by mouth every other day.   . potassium chloride (K-DUR) 10 MEQ tablet Take 10 mEq by mouth 2 (two) times daily.   Marland Kitchen triamterene-hydrochlorothiazide (MAXZIDE-25) 37.5-25 MG per tablet Take 1 tablet by mouth daily.  . [DISCONTINUED] LORazepam (ATIVAN) 0.5 MG tablet TAKE 1/2 TO 1 TABLET TWICE DAILY AS NEEDED FOR ANXIETY.    EXAM:  BP 136/56 mmHg  Temp(Src) 97.9 F (36.6 C) (Oral)  Ht 5\' 2"  (1.575 m)  Wt 120 lb 3.2 oz (54.522 kg)  BMI 21.98 kg/m2  Body mass index is 21.98 kg/(m^2).  GENERAL: vitals reviewed and listed above, alert, oriented, appears well hydrated and in no acute distress has moderate to mild tremor of her hands cognition appears intact mildly anxious HEENT: atraumatic, conjunctiva  clear, no obvious abnormalities on inspection of external nose and ears  NECK: no obvious masses on inspection palpation  LUNGS: clear to auscultation bilaterally, no wheezes, rales or rhonchi, good air movement CV: HRRR, about 90 no clubbing cyanosis or  peripheral edema nl cap refill  MS: moves all extremities  walks with a walker roller favoring right leg some uncomfortable no obvious weakness PSYCH: d cooperative, cognition appears normal mildly anxious appropriate.  have reviewed blood pressure readings appear to be up and down in the 140s sometimes 160 or 80 pulses in the 80s and 90s  ASSESSMENT AND PLAN:  Discussed the following assessment and plan:  Essential hypertension - Plan: Basic metabolic panel, Magnesium  Adjustment disorder with anxious mood  Medication management  Low back pain radiating to right leg - Possible lumbar stenosis under  evaluation MRI is pending now on daily opiates.  Parkinsonian tremor Labile hypertension possibly related to pain parkinsonian autonomic changes and medication. At this time would not change the medicine that Dr. Lovena Le gave her check her blood pressure in the morning and then after taking a pain medicine to see if it improves.  I agree with some type of controller medicine for anxiety. She states she sleeps well. Anxiety had improved and was only using Ativan occasionally until this pain began. Would prefer to use an SSRI instead of an  SSRI at this time because of side effect profile. Consider low-dose sertraline. Would okay this first with Dr. Lovena Le because of the low risk of QT changes etc. If so we can begin this and have her follow-up. With the hopes of decreasing her need for a benzodiazepine. Consideration of gabapentin is reasonable but would have that managed by the specialist managing the back. And leg pain. Caution was medicines and side effects. Check BMP today to ensure stability of potassium. -Patient advised to return or notify health care team  if symptoms worsen ,persist or new concerns arise.  Patient Instructions  Will notify you  of labs when available. Will let you know results . Consideration of adding low dose zoloft   Or similar but would have dr taylor to ok. Would take about 2-3 weeks to help  Anxiety.  And depressioin.  Can send in  If he agrees ok   Make sure  We get copy of specialists visits . ( ie ORTHO)   Sen in  bp readings and i can also disc with dr  T   But  The parkinsons can cause  Up and down bp as welkl as pain .    Standley Brooking. Deretha Ertle M.D.  Total visit 40 mins > 50% spent counseling and coordinating care

## 2014-11-08 NOTE — Patient Instructions (Signed)
Will notify you  of labs when available. Will let you know results . Consideration of adding low dose zoloft   Or similar but would have dr taylor to ok. Would take about 2-3 weeks to help  Anxiety.  And depressioin.  Can send in  If he agrees ok   Make sure  We get copy of specialists visits . ( ie ORTHO)   Sen in  bp readings and i can also disc with dr  T   But  The parkinsons can cause  Up and down bp as welkl as pain .

## 2014-11-09 ENCOUNTER — Telehealth: Payer: Self-pay | Admitting: Internal Medicine

## 2014-11-09 NOTE — Telephone Encounter (Signed)
emmi emailed °

## 2014-11-17 ENCOUNTER — Telehealth: Payer: Self-pay | Admitting: Family Medicine

## 2014-11-17 NOTE — Telephone Encounter (Signed)
Misty     please contact Ms plack and advise ok with dr Lovena Le so we can Add sertraline 25 mg po per day Disp 30 refill x 1         ROV in a month if not already on The appt schedule    WP    Have her tak

## 2014-11-18 ENCOUNTER — Telehealth: Payer: Self-pay | Admitting: Internal Medicine

## 2014-11-18 MED ORDER — SERTRALINE HCL 25 MG PO TABS: 25.0000 mg | ORAL_TABLET | Freq: Every day | ORAL | Status: DC

## 2014-11-18 NOTE — Telephone Encounter (Signed)
Pt daughter called to ask if Dr Regis Bill is going to start her on zoloft . Would like a call back today .Marland Kitchen They would like to get her started as soon as possible

## 2014-11-18 NOTE — Telephone Encounter (Signed)
Duplicate message.  Please see other note.

## 2014-11-18 NOTE — Telephone Encounter (Signed)
Pt notified.  Has an upcoming appt on 12/15/14

## 2014-11-22 ENCOUNTER — Other Ambulatory Visit: Payer: Self-pay | Admitting: Family Medicine

## 2014-11-23 ENCOUNTER — Telehealth: Payer: Self-pay | Admitting: Internal Medicine

## 2014-11-23 NOTE — Telephone Encounter (Signed)
PLEASE NOTE: All timestamps contained within this report are represented as [place] Standard Time. CONFIDENTIALTY NOTICE: This fax transmission is intended only for the addressee. It contains information that is legally privileged, confidential or otherwise protected from use or disclosure. If you are not the intended recipient, you are strictly prohibited from reviewing, disclosing, copying using or disseminating any of this information or taking any action in reliance on or regarding this information. If you have received this fax in error, please notify us immediately by telephone so that we can arrange for its return to [place]. Phone: [phone], Toll-Free: [phone], Fax: [phone] Page: 1 of 2 Call Id: [id number] [facility] - Client [place] Patient Name: Kimberly Silva Gender: Female DOB: October 31, 1926 Age: 80 Y 30 M 17 D Return Phone Number: [phone] (Primary) Address: [place]. City/State/Zip: [place] [address] Client [facility] - Client Client Site [facility] - Day Physician Shanon Ace Contact Type Call Call Type Triage / Clinical Relationship To Patient Self Return Phone Number [phone] (Primary) Chief Complaint Nausea Initial Comment Caller states she has been nauseas; needs to know if she can take medication PreDisposition Did not know what to do Nurse Assessment Nurse: Mechele Dawley, RN, Amy Date/Time Eilene Ghazi Time): 11/23/2014 10:47:29 AM Confirm and document reason for call. If symptomatic, describe symptoms. ---CALLER STATES THAT SHE HAD GONE TO A SPECIALIST FOR BACK PAIN. SHE STATES SHE HAD TO GO REAL EARLY AND SHE WAS ON AN EMPTY STOMACH AND SHE FELT NAUSEATED ALL DAY. HE RECOMMENDED THAT SHE WAS SUPPOSE TO SEE ORTHO SURGEON. SHE IS TAKING HYDROCODONE FOR THE SEVERE BACK PAIN. SHE IS ON THE SAME MEDICATIONS THAT SHE WAS ON THE LAST  TIME HER PCP APPT WITH DR. PANOSH. - SHE IS WANTING TO CONSULT WITH [facility] ABOUT THE SURGERY. Has the patient traveled out of the country within the last 30 days? ---Not Applicable Does the patient require triage? ---Yes Related visit to physician within the last 2 weeks? ---Yes Does the PT have any chronic conditions? (i.e. diabetes, asthma, etc.) ---Yes List chronic conditions. ---HTN, ?PACER, SEVERE BACK PAIN, PARKINSONS, Guidelines Guideline Title Affirmed Question Affirmed Notes Nurse Date/Time (Eastern Time) Nausea Taking prescription medication that could cause nausea (e.g., narcotics/opiates, antibiotics, OCPs, many others) SHE HAS BEEN TAKING THEM FOR A WHILE [place], RN, Amy 11/23/2014 10:52:51 AM Disp. Time Eilene Ghazi Time) Disposition Final User PLEASE NOTE: All timestamps contained within this report are represented as [place] Standard Time. CONFIDENTIALTY NOTICE: This fax transmission is intended only for the addressee. It contains information that is legally privileged, confidential or otherwise protected from use or disclosure. If you are not the intended recipient, you are strictly prohibited from reviewing, disclosing, copying using or disseminating any of this information or taking any action in reliance on or regarding this information. If you have received this fax in error, please notify us immediately by telephone so that we can arrange for its return to [place]. Phone: [phone], Toll-Free: [phone], Fax: [phone] Page: 2 of 2 Call Id: [id number] 11/23/2014 10:59:08 AM Call PCP within 24 Hours Yes Mechele Dawley, [place], Amy Caller Understands: Yes Disagree/Comply: Comply Care Advice Given Per Guideline CALL PCP WITHIN 24 HOURS: You need to discuss this with your doctor within the next 24 hours. CLEAR FLUIDS - Take clear fluids in small amounts until the nausea is resolved for 8 hours: SOLIDS: Gradually return to a normal diet. Start with saltine crackers, white  bread, rice, mashed potatoes, cereal, apple sauce etc. CALL BACK IF: * You become worse. CARE ADVICE given per Nausea (Adult) guideline. After Care Instructions Given Call Event Type User Date / Time Description Comments User: Susanne Borders, RN Date/Time Eilene Ghazi Time): 11/23/2014 11:00:20 AM CALLER IS GOING TO TAKE HER ZOFRAN MEDICATION THAT HAS BEEN PRESCRIBED AND SHE WILL FOLLOW UP AT THE MD OFFICE IF NEEDED. Referrals REFERRED TO PCP OFFICE

## 2014-11-24 NOTE — Telephone Encounter (Signed)
Noted  

## 2014-12-08 ENCOUNTER — Telehealth: Payer: Self-pay | Admitting: *Deleted

## 2014-12-08 ENCOUNTER — Ambulatory Visit: Payer: Medicare Other | Admitting: Family Medicine

## 2014-12-08 ENCOUNTER — Encounter (HOSPITAL_COMMUNITY): Payer: Self-pay

## 2014-12-08 ENCOUNTER — Emergency Department (HOSPITAL_COMMUNITY): Payer: Medicare Other

## 2014-12-08 ENCOUNTER — Inpatient Hospital Stay (HOSPITAL_COMMUNITY)
Admission: EM | Admit: 2014-12-08 | Discharge: 2014-12-12 | DRG: 689 | Disposition: A | Discharge status: Skilled Nursing Facility | Payer: Medicare Other | Attending: Internal Medicine | Admitting: Internal Medicine

## 2014-12-08 DIAGNOSIS — G20 Parkinson's disease: Secondary | ICD-10-CM | POA: Diagnosis present

## 2014-12-08 DIAGNOSIS — E785 Hyperlipidemia, unspecified: Secondary | ICD-10-CM | POA: Diagnosis present

## 2014-12-08 DIAGNOSIS — Z9841 Cataract extraction status, right eye: Secondary | ICD-10-CM

## 2014-12-08 DIAGNOSIS — S32030S Wedge compression fracture of third lumbar vertebra, sequela: Secondary | ICD-10-CM

## 2014-12-08 DIAGNOSIS — Z90710 Acquired absence of both cervix and uterus: Secondary | ICD-10-CM

## 2014-12-08 DIAGNOSIS — Z86010 Personal history of colonic polyps: Secondary | ICD-10-CM | POA: Diagnosis not present

## 2014-12-08 DIAGNOSIS — E860 Dehydration: Secondary | ICD-10-CM | POA: Diagnosis present

## 2014-12-08 DIAGNOSIS — M4806 Spinal stenosis, lumbar region: Secondary | ICD-10-CM | POA: Diagnosis present

## 2014-12-08 DIAGNOSIS — Z7951 Long term (current) use of inhaled steroids: Secondary | ICD-10-CM

## 2014-12-08 DIAGNOSIS — E43 Unspecified severe protein-calorie malnutrition: Secondary | ICD-10-CM | POA: Diagnosis present

## 2014-12-08 DIAGNOSIS — Z7982 Long term (current) use of aspirin: Secondary | ICD-10-CM

## 2014-12-08 DIAGNOSIS — Z9181 History of falling: Secondary | ICD-10-CM | POA: Diagnosis not present

## 2014-12-08 DIAGNOSIS — R531 Weakness: Secondary | ICD-10-CM | POA: Diagnosis not present

## 2014-12-08 DIAGNOSIS — Z9842 Cataract extraction status, left eye: Secondary | ICD-10-CM | POA: Diagnosis not present

## 2014-12-08 DIAGNOSIS — I10 Essential (primary) hypertension: Secondary | ICD-10-CM | POA: Diagnosis present

## 2014-12-08 DIAGNOSIS — S32039S Unspecified fracture of third lumbar vertebra, sequela: Secondary | ICD-10-CM | POA: Diagnosis not present

## 2014-12-08 DIAGNOSIS — E871 Hypo-osmolality and hyponatremia: Secondary | ICD-10-CM

## 2014-12-08 DIAGNOSIS — M1990 Unspecified osteoarthritis, unspecified site: Secondary | ICD-10-CM | POA: Diagnosis present

## 2014-12-08 DIAGNOSIS — N390 Urinary tract infection, site not specified: Principal | ICD-10-CM

## 2014-12-08 DIAGNOSIS — Z79899 Other long term (current) drug therapy: Secondary | ICD-10-CM

## 2014-12-08 DIAGNOSIS — M8580 Other specified disorders of bone density and structure, unspecified site: Secondary | ICD-10-CM | POA: Diagnosis present

## 2014-12-08 DIAGNOSIS — R05 Cough: Secondary | ICD-10-CM

## 2014-12-08 DIAGNOSIS — R059 Cough, unspecified: Secondary | ICD-10-CM

## 2014-12-08 DIAGNOSIS — Z6820 Body mass index (BMI) 20.0-20.9, adult: Secondary | ICD-10-CM | POA: Diagnosis not present

## 2014-12-08 DIAGNOSIS — R110 Nausea: Secondary | ICD-10-CM

## 2014-12-08 HISTORY — DX: Wedge compression fracture of unspecified lumbar vertebra, initial encounter for closed fracture: S32.000A

## 2014-12-08 LAB — COMPREHENSIVE METABOLIC PANEL WITH GFR
ALT: <5 U/L (ref 0–35)
AST: 11 U/L (ref 0–37)
Albumin: 3.8 g/dL (ref 3.5–5.2)
Alkaline Phosphatase: 59 U/L (ref 39–117)
Anion gap: 7 (ref 5–15)
BUN: 15 mg/dL (ref 6–23)
CO2: 26 mmol/L (ref 19–32)
Calcium: 9.3 mg/dL (ref 8.4–10.5)
Chloride: 98 meq/L (ref 96–112)
Creatinine, Ser: 1.12 mg/dL — ABNORMAL HIGH (ref 0.50–1.10)
GFR calc Af Amer: 50 mL/min — ABNORMAL LOW
GFR calc non Af Amer: 43 mL/min — ABNORMAL LOW
Glucose, Bld: 105 mg/dL — ABNORMAL HIGH (ref 70–99)
Potassium: 4 mmol/L (ref 3.5–5.1)
Sodium: 131 mmol/L — ABNORMAL LOW (ref 135–145)
Total Bilirubin: 0.5 mg/dL (ref 0.3–1.2)
Total Protein: 6.6 g/dL (ref 6.0–8.3)

## 2014-12-08 LAB — CBC WITH DIFFERENTIAL/PLATELET
Basophils Absolute: 0 K/uL (ref 0.0–0.1)
Basophils Relative: 0 % (ref 0–1)
Eosinophils Absolute: 0 K/uL (ref 0.0–0.7)
Eosinophils Relative: 0 % (ref 0–5)
HCT: 32.5 % — ABNORMAL LOW (ref 36.0–46.0)
Hemoglobin: 10.7 g/dL — ABNORMAL LOW (ref 12.0–15.0)
Lymphocytes Relative: 11 % — ABNORMAL LOW (ref 12–46)
Lymphs Abs: 0.8 K/uL (ref 0.7–4.0)
MCH: 27.3 pg (ref 26.0–34.0)
MCHC: 32.9 g/dL (ref 30.0–36.0)
MCV: 82.9 fL (ref 78.0–100.0)
Monocytes Absolute: 0.7 K/uL (ref 0.1–1.0)
Monocytes Relative: 10 % (ref 3–12)
Neutro Abs: 5.5 K/uL (ref 1.7–7.7)
Neutrophils Relative %: 79 % — ABNORMAL HIGH (ref 43–77)
Platelets: 269 K/uL (ref 150–400)
RBC: 3.92 MIL/uL (ref 3.87–5.11)
RDW: 13.5 % (ref 11.5–15.5)
WBC: 7 K/uL (ref 4.0–10.5)

## 2014-12-08 LAB — URINALYSIS, ROUTINE W REFLEX MICROSCOPIC
BILIRUBIN URINE: NEGATIVE
GLUCOSE, UA: NEGATIVE mg/dL
HGB URINE DIPSTICK: NEGATIVE
Ketones, ur: NEGATIVE mg/dL
Nitrite: NEGATIVE
PH: 6.5 (ref 5.0–8.0)
Protein, ur: NEGATIVE mg/dL
SPECIFIC GRAVITY, URINE: 1.008 (ref 1.005–1.030)
Urobilinogen, UA: 0.2 mg/dL (ref 0.0–1.0)

## 2014-12-08 LAB — TROPONIN I
Troponin I: <0.03 ng/mL (ref <0.031)
Troponin I: <0.03 ng/mL (ref <0.031)

## 2014-12-08 LAB — MAGNESIUM: Magnesium: 1.7 mg/dL (ref 1.5–2.5)

## 2014-12-08 LAB — URINE MICROSCOPIC-ADD ON

## 2014-12-08 LAB — LIPASE, BLOOD: LIPASE: 27 U/L (ref 11–59)

## 2014-12-08 LAB — I-STAT TROPONIN, ED: TROPONIN I, POC: 0 ng/mL (ref 0.00–0.08)

## 2014-12-08 LAB — TSH: TSH: 0.767 u[IU]/mL (ref 0.350–4.500)

## 2014-12-08 MED ORDER — CARBIDOPA-LEVODOPA 25-100 MG PO TABS
1.0000 | ORAL_TABLET | ORAL | Status: DC
  Administered 2014-12-08 – 2014-12-12 (×8): 1 via ORAL
  Filled 2014-12-08 (×12): qty 1

## 2014-12-08 MED ORDER — POLYETHYLENE GLYCOL 3350 17 G PO PACK
17.0000 g | PACK | Freq: Every day | ORAL | Status: DC | PRN
  Filled 2014-12-08: qty 1

## 2014-12-08 MED ORDER — DEXTROSE 5 % IV SOLN
1.0000 g | Freq: Once | INTRAVENOUS | Status: AC
  Administered 2014-12-08: 1 g via INTRAVENOUS
  Filled 2014-12-08: qty 10

## 2014-12-08 MED ORDER — ASPIRIN 81 MG PO CHEW
81.0000 mg | CHEWABLE_TABLET | Freq: Every day | ORAL | Status: DC
  Administered 2014-12-09 – 2014-12-12 (×4): 81 mg via ORAL
  Filled 2014-12-08 (×7): qty 1

## 2014-12-08 MED ORDER — ASPIRIN 81 MG PO TABS: 81.0000 mg | ORAL_TABLET | Freq: Every day | ORAL | Status: DC

## 2014-12-08 MED ORDER — HYDROCODONE-ACETAMINOPHEN 5-325 MG PO TABS
1.0000 | ORAL_TABLET | Freq: Four times a day (QID) | ORAL | Status: DC | PRN
  Filled 2014-12-08: qty 1

## 2014-12-08 MED ORDER — ONDANSETRON HCL 4 MG PO TABS: 4.0000 mg | ORAL_TABLET | Freq: Four times a day (QID) | ORAL | Status: DC | PRN

## 2014-12-08 MED ORDER — METOCLOPRAMIDE HCL 5 MG/ML IJ SOLN
10.0000 mg | Freq: Once | INTRAMUSCULAR | Status: AC
  Administered 2014-12-08: 10 mg via INTRAVENOUS
  Filled 2014-12-08: qty 2

## 2014-12-08 MED ORDER — SODIUM CHLORIDE 0.9 % IV SOLN
INTRAVENOUS | Status: DC
  Administered 2014-12-08 – 2014-12-09 (×2): via INTRAVENOUS

## 2014-12-08 MED ORDER — PANTOPRAZOLE SODIUM 40 MG PO TBEC
40.0000 mg | DELAYED_RELEASE_TABLET | Freq: Every day | ORAL | Status: DC
  Administered 2014-12-09 – 2014-12-12 (×4): 40 mg via ORAL
  Filled 2014-12-08 (×4): qty 1

## 2014-12-08 MED ORDER — POLYVINYL ALCOHOL 1.4 % OP SOLN
1.0000 [drp] | Freq: Two times a day (BID) | OPHTHALMIC | Status: DC
  Administered 2014-12-08 – 2014-12-12 (×8): 1 [drp] via OPHTHALMIC
  Filled 2014-12-08: qty 15

## 2014-12-08 MED ORDER — FLUTICASONE PROPIONATE 50 MCG/ACT NA SUSP
2.0000 | Freq: Every day | NASAL | Status: DC
  Administered 2014-12-08 – 2014-12-11 (×4): 2 via NASAL
  Filled 2014-12-08: qty 16

## 2014-12-08 MED ORDER — CARBIDOPA-LEVODOPA 25-100 MG PO TABS
0.5000 | ORAL_TABLET | Freq: Once | ORAL | Status: AC
  Administered 2014-12-08: 0.5 via ORAL
  Filled 2014-12-08: qty 0.5

## 2014-12-08 MED ORDER — ACETAMINOPHEN 325 MG PO TABS
650.0000 mg | ORAL_TABLET | Freq: Four times a day (QID) | ORAL | Status: DC | PRN
  Administered 2014-12-09 – 2014-12-11 (×4): 650 mg via ORAL
  Filled 2014-12-08 (×4): qty 2

## 2014-12-08 MED ORDER — ENSURE COMPLETE PO LIQD
237.0000 mL | Freq: Two times a day (BID) | ORAL | Status: DC
  Administered 2014-12-09 – 2014-12-12 (×3): 237 mL via ORAL

## 2014-12-08 MED ORDER — HYDRALAZINE HCL 10 MG PO TABS
10.0000 mg | ORAL_TABLET | Freq: Two times a day (BID) | ORAL | Status: DC
Start: 2014-12-08 — End: 2014-12-11
  Administered 2014-12-08 – 2014-12-11 (×6): 10 mg via ORAL
  Filled 2014-12-08 (×9): qty 1

## 2014-12-08 MED ORDER — POTASSIUM CHLORIDE ER 10 MEQ PO TBCR
10.0000 meq | EXTENDED_RELEASE_TABLET | Freq: Two times a day (BID) | ORAL | Status: DC
  Administered 2014-12-08 – 2014-12-12 (×8): 10 meq via ORAL
  Filled 2014-12-08 (×10): qty 1

## 2014-12-08 MED ORDER — DOCUSATE SODIUM 100 MG PO CAPS
100.0000 mg | ORAL_CAPSULE | Freq: Two times a day (BID) | ORAL | Status: DC
  Administered 2014-12-09 – 2014-12-12 (×3): 100 mg via ORAL
  Filled 2014-12-08 (×9): qty 1

## 2014-12-08 MED ORDER — LORAZEPAM 0.5 MG PO TABS
0.5000 mg | ORAL_TABLET | Freq: Two times a day (BID) | ORAL | Status: DC | PRN
Start: 2014-12-08 — End: 2014-12-12
  Administered 2014-12-09 – 2014-12-11 (×4): 0.5 mg via ORAL
  Filled 2014-12-08 (×4): qty 1

## 2014-12-08 MED ORDER — MAGNESIUM GLUCONATE 500 MG PO TABS
250.0000 mg | ORAL_TABLET | Freq: Every day | ORAL | Status: DC
  Administered 2014-12-09: 250 mg via ORAL
  Filled 2014-12-08 (×2): qty 1

## 2014-12-08 MED ORDER — CEFTRIAXONE SODIUM 1 G IJ SOLR
1.0000 g | INTRAMUSCULAR | Status: DC
Start: 2014-12-08 — End: 2014-12-08

## 2014-12-08 MED ORDER — CYANOCOBALAMIN 1000 MCG/ML IJ SOLN
1000.0000 ug | INTRAMUSCULAR | Status: DC
Start: 2014-12-28 — End: 2014-12-12

## 2014-12-08 MED ORDER — MAGNESIUM 250 MG PO TABS: 250.0000 mg | ORAL_TABLET | Freq: Every day | ORAL | Status: DC

## 2014-12-08 MED ORDER — LEVALBUTEROL HCL 0.63 MG/3ML IN NEBU: 0.6300 mg | INHALATION_SOLUTION | Freq: Four times a day (QID) | RESPIRATORY_TRACT | Status: DC | PRN

## 2014-12-08 MED ORDER — DIPHENHYDRAMINE HCL 50 MG/ML IJ SOLN
25.0000 mg | Freq: Once | INTRAMUSCULAR | Status: AC
  Administered 2014-12-08: 25 mg via INTRAVENOUS
  Filled 2014-12-08: qty 1

## 2014-12-08 MED ORDER — ENOXAPARIN SODIUM 40 MG/0.4ML ~~LOC~~ SOLN: 40.0000 mg | SUBCUTANEOUS | Status: DC

## 2014-12-08 MED ORDER — SODIUM CHLORIDE 0.9 % IV BOLUS (SEPSIS)
1000.0000 mL | Freq: Once | INTRAVENOUS | Status: AC
  Administered 2014-12-08: 1000 mL via INTRAVENOUS

## 2014-12-08 MED ORDER — DEXTROSE 5 % IV SOLN
1.0000 g | INTRAVENOUS | Status: DC
  Administered 2014-12-09 – 2014-12-11 (×3): 1 g via INTRAVENOUS
  Filled 2014-12-08 (×4): qty 10

## 2014-12-08 MED ORDER — CARBIDOPA-LEVODOPA 25-100 MG PO TABS
0.5000 | ORAL_TABLET | ORAL | Status: DC
  Administered 2014-12-09 – 2014-12-11 (×5): 0.5 via ORAL
  Filled 2014-12-08 (×4): qty 0.5

## 2014-12-08 MED ORDER — ACETAMINOPHEN 650 MG RE SUPP: 650.0000 mg | Freq: Four times a day (QID) | RECTAL | Status: DC | PRN

## 2014-12-08 MED ORDER — ONDANSETRON HCL 4 MG/2ML IJ SOLN: 4.0000 mg | Freq: Four times a day (QID) | INTRAMUSCULAR | Status: DC | PRN

## 2014-12-08 MED ORDER — POLYETHYL GLYCOL-PROPYL GLYCOL 0.4-0.3 % OP SOLN: 1.0000 [drp] | Freq: Two times a day (BID) | OPHTHALMIC | Status: DC

## 2014-12-08 NOTE — Telephone Encounter (Signed)
Dr. Regis Bill spoke with daughter

## 2014-12-08 NOTE — Telephone Encounter (Signed)
Spoke with daughter and son in law  Has had dec po intake and nausea beflt from back and pain med but last night worse  Only taking  Clear soup and crackers type  inake  Worsening  / dehydration  Concern with dec UO .    Disc with daughter  concern about metabolic   Hyponatremia  Renal function etc . With meds  They will go to [facility] ed for evaluation  For  Dec po intake weakness and dehydration .

## 2014-12-08 NOTE — Progress Notes (Signed)
Utilization Review completed.  Gaelen Brager RN CM  

## 2014-12-08 NOTE — ED Notes (Signed)
Bed: JP36 Expected date:  Expected time:  Means of arrival:  Comments: EMS- elderly, n/v/d

## 2014-12-08 NOTE — ED Provider Notes (Signed)
CSN: [id number]     Arrival date & time 12/08/14  7591 History   First MD Initiated Contact with Patient 12/08/14 1020     Chief Complaint  Patient presents with  . Nausea  . Weakness     (Consider location/radiation/quality/duration/timing/severity/associated sxs/prior Treatment) The history is provided by the patient and a relative.  ROTHA CASSELS is a 80 y.o. female hx of HL, HTN here with  Nausea and weakness.  She has been nauseated for the last month.  She fell about a month ago and had spinal fracture and has been on hydrocodone since then. Has poor appetite since then and has been nauseated. Denies any vomiting or abdominal pain or constipation. Called PMD and was sent here for evaluation for dehydration. Denies fevers.    Past Medical History  Diagnosis Date  . HYPERLIPIDEMIA 02/21/2009  . HYPOKALEMIA 08/17/2007  . ANEMIA, B12 DEFICIENCY 03/13/2009  . HYPERTENSION 05/14/2007  . Blood in stool 01/25/2008  . OSTEOARTHRITIS 05/14/2007  . OSTEOPENIA 05/14/2007    2007 hx of fosamax use   . TREMOR 07/08/2007  . FREQUENCY, URINARY 08/19/2008  . LUMBAR STRAIN 09/21/2007  . Gastric polyp     panendo  5 2009  . History of ankle fracture   . Parkinson's disease   . Adenomatous colon polyp   . Lumbar compression fracture    Past Surgical History  Procedure Laterality Date  . Abdominal hysterectomy    . Fracture  2007    left ankle- Hx of fosamax use  . Dilation and curettage of uterus    . Cataract extraction, bilateral    . Colonoscopy N/A 07/19/2013    Procedure: COLONOSCOPY;  Surgeon: Lafayette Dragon, MD;  Location: [facility] ENDOSCOPY;  Service: Endoscopy;  Laterality: N/A;   Family History  Problem Relation Age of Onset  . Heart attack Mother   . Coronary artery disease Mother   . Colon cancer Sister     x 2  . Breast cancer Sister     x 4  . Parkinsonism Brother   . Peripheral vascular disease      sibling  . Stroke Father   . Colon cancer Other     nephew  . Colon cancer  Paternal Aunt   . Breast cancer Sister   . Breast cancer Sister    History  Substance Use Topics  . Smoking status: Never Smoker   . Smokeless tobacco: Never Used  . Alcohol Use: No   OB History    Gravida Para Term Preterm AB TAB SAB Ectopic Multiple Living   5 5             Review of Systems  Gastrointestinal: Positive for nausea.  Neurological: Positive for weakness.  All other systems reviewed and are negative.     Allergies  Review of patient's allergies indicates no known allergies.  Home Medications   Prior to Admission medications   Medication Sig Start Date End Date Taking? Authorizing Provider  aspirin 81 MG tablet Take 81 mg by mouth daily with breakfast.    Yes Historical Provider, MD  carbidopa-levodopa (SINEMET IR) 25-100 MG per tablet Take 0.5-1 tablets by mouth 3 (three) times daily. Take 1 tablet in the morning, 1/2 tablet at 1600, 1 tablet at bedtime   Yes Historical Provider, MD  Cholecalciferol (VITAMIN D3) 1000 UNITS CAPS Take 1,000 Units by mouth daily with breakfast.    Yes Historical Provider, MD  cyanocobalamin (,VITAMIN B-12,) 1000 MCG/ML injection Inject  1 mL (1,000 mcg total) into the muscle every 30 (thirty) days. 07/05/14  Yes Burnis Medin, MD  fluticasone (FLONASE) 50 MCG/ACT nasal spray Place 2 sprays into both nostrils at bedtime.   Yes Historical Provider, MD  hydrALAZINE (APRESOLINE) 10 MG tablet Take 1 tablet (10 mg total) by mouth 2 (two) times daily. 09/20/14  Yes Evans Lance, MD  HYDROcodone-acetaminophen (NORCO/VICODIN) 5-325 MG per tablet Take 1 tablet by mouth every 6 (six) hours as needed (for back pain).  10/06/14  Yes Historical Provider, MD  lisinopril (PRINIVIL,ZESTRIL) 10 MG tablet TAKE 1 TABLET ONCE DAILY. 10/19/14  Yes Thompson Grayer, MD  LORazepam (ATIVAN) 0.5 MG tablet TAKE 1/2 TO 1 TABLET TWICE DAILY AS NEEDED FOR ANXIETY. 11/08/14  Yes Burnis Medin, MD  Magnesium 250 MG TABS Take 250 mg by mouth daily with breakfast.     Yes Historical Provider, MD  ondansetron (ZOFRAN) 4 MG tablet Take 4 mg by mouth every 8 (eight) hours as needed for nausea or vomiting.   Yes Historical Provider, MD  pantoprazole (PROTONIX) 40 MG tablet Take 40 mg by mouth daily.   Yes Historical Provider, MD  Polyethyl Glycol-Propyl Glycol (SYSTANE) 0.4-0.3 % SOLN Apply 1 drop to eye 2 (two) times daily.    Yes Historical Provider, MD  polyethylene glycol (MIRALAX / GLYCOLAX) packet Take 17 g by mouth daily as needed for mild constipation.    Yes Historical Provider, MD  potassium chloride (K-DUR) 10 MEQ tablet Take 10 mEq by mouth 2 (two) times daily.    Yes Historical Provider, MD  PRESCRIPTION MEDICATION Steroid injection in lumbar area - at dr's office   Yes Historical Provider, MD  sertraline (ZOLOFT) 25 MG tablet Take 1 tablet (25 mg total) by mouth daily. Patient taking differently: Take 25 mg by mouth at bedtime.  11/18/14  Yes Burnis Medin, MD  triamterene-hydrochlorothiazide (MAXZIDE-25) 37.5-25 MG per tablet Take 1 tablet by mouth daily with breakfast.    Yes Historical Provider, MD   BP 143/66 mmHg  Pulse 69  Temp(Src) 98.2 F (36.8 C) (Oral)  Resp 19  SpO2 99% Physical Exam  Constitutional: She is oriented to person, place, and time.  Chronically ill, slightly dehydrated   HENT:  Head: Normocephalic.  MM dry   Eyes: Conjunctivae and EOM are normal. Pupils are equal, round, and reactive to light.  Neck: Normal range of motion. Neck supple.  Cardiovascular: Normal rate, regular rhythm and normal heart sounds.   Pulmonary/Chest: Effort normal and breath sounds normal. No respiratory distress. She has no wheezes. She has no rales.  Abdominal: Soft. Bowel sounds are normal. She exhibits no distension. There is no tenderness. There is no rebound and no guarding.  Musculoskeletal: Normal range of motion. She exhibits no edema or tenderness.  Neurological: She is alert and oriented to person, place, and time. No cranial nerve  deficit. Coordination normal.  Skin: Skin is warm and dry.  Psychiatric: She has a normal mood and affect. Her behavior is normal. Judgment and thought content normal.  Nursing note and vitals reviewed.   ED Course  Procedures (including critical care time) Labs Review Labs Reviewed  CBC WITH DIFFERENTIAL - Abnormal; Notable for the following:    Hemoglobin 10.7 (*)    HCT 32.5 (*)    Neutrophils Relative % 79 (*)    Lymphocytes Relative 11 (*)    All other components within normal limits  COMPREHENSIVE METABOLIC PANEL - Abnormal; Notable for the following:  Sodium 131 (*)    Glucose, Bld 105 (*)    Creatinine, Ser 1.12 (*)    GFR calc non Af Amer 43 (*)    GFR calc Af Amer 50 (*)    All other components within normal limits  URINALYSIS, ROUTINE W REFLEX MICROSCOPIC - Abnormal; Notable for the following:    Leukocytes, UA MODERATE (*)    All other components within normal limits  URINE MICROSCOPIC-ADD ON - Abnormal; Notable for the following:    Bacteria, UA FEW (*)    Casts HYALINE CASTS (*)    All other components within normal limits  URINE CULTURE  LIPASE, BLOOD  I-STAT TROPOININ, ED    Imaging Review Dg Abd Acute W/chest  12/08/2014   CLINICAL DATA:  Nausea and vomiting, decreased oral intake  EXAM: ACUTE ABDOMEN SERIES (ABDOMEN 2 VIEW & CHEST 1 VIEW)  COMPARISON:  08/22/2014  FINDINGS: The heart and pulmonary vascularity are within normal limits. Elevation of left hemidiaphragm is again seen. No acute bony abnormality is noted.  And scattered large and small bowel gas is seen. No free air is noted. No obstructive changes are seen. No abnormal mass or abnormal calcifications are noted. Old compression deformities are noted within the lumbar spine with evidence of a degenerative scoliosis concave to the right in the lumbar spine.  IMPRESSION: L3 compression deformity consistent with the patient's given clinical history. No acute abnormality is noted in the chest and  abdomen.   Electronically Signed   By: Inez Catalina M.D.   On: 12/08/2014 13:13     EKG Interpretation   Date/Time:  Thursday December 08 2014 10:31:17 EST Ventricular Rate:  73 PR Interval:  167 QRS Duration: 89 QT Interval:  378 QTC Calculation: 416 R Axis:   38 Text Interpretation:  Sinus rhythm Inferior infarct, age indeterminate  Probable anteroseptal infarct, old Lateral leads are also involved TWI  inferior leads new Confirmed by Lisandro Meggett  MD, Azell Bill ([id number]) on 12/08/2014  10:45:49 AM      MDM   Final diagnoses:  Nausea    CHRISTIAN TREADWAY is a 80 y.o. female here with dehydration worse with vicodin use. Will check labs and hydrate.   1:33 PM Labs showed Na 131. UA + UTI. Given rocephin. Still nauseated. Will admit for dehydration, hyponatremia, UTI.     Wandra Arthurs, MD 12/08/14 [phone]

## 2014-12-08 NOTE — H&P (Addendum)
[facility] History and Physical  Kimberly Silva WUJ:[id number] DOB: [birth year] DOA: 12/08/2014  Referring physician:  PCP: Lottie Dawson, MD   Chief Complaint: Poor oral intake  HPI:  80 year old female with a history of hypertension, lumbar spinal stenosis, living in [facility]   [place], presents with nausea and weakness for the last 1 month. Patient had a fall one month ago for which he has been taking hydrocodone. She has not been constipated and usually takes miralax  for constipation however she has had nausea and poor oral intake for which she has been taking Zofran with no relief. She denies any fever chills, denies any shortness of breath or chest pain. She was admitted in September for bradycardia secondary to AV nodal blocking medications. Her Coreg was discontinued. This has  not been an  issue since. Patient was recently told that she has a  L3 compression fracture last week. rx with epidural steroid injection last week   today the patient also found to have a urinary tract infection     Review of Systems: negative for the following  Constitutional: Denies fever, chills, diaphoresis, appetite change and fatigue.  HEENT: Denies photophobia, eye pain, redness, hearing loss, ear pain, congestion, sore throat, rhinorrhea, sneezing, mouth sores, trouble swallowing, neck pain, neck stiffness and tinnitus.  Respiratory: Denies SOB, DOE, cough, chest tightness, and wheezing.  Cardiovascular: Denies chest pain, palpitations and leg swelling.  Gastrointestinal: Positive foromiting, abdominal pain, diarrhea, constipation, blood in stool and abdominal distention.  Genitourinary: Denies dysuria, urgency, frequency, hematuria, flank pain and difficulty urinating.  Musculoskeletal: Denies myalgias, back pain, joint swelling, arthralgias and gait problem.  Skin: Denies pallor, rash and wound.  Neurological: Denies dizziness, seizures, syncope,  positive for weakness,  light-headedness, numbness and headaches.  Hematological: Denies adenopathy. Easy bruising, personal or family bleeding history  Psychiatric/Behavioral: Denies suicidal ideation, mood changes, confusion, nervousness, sleep disturbance and agitation       Past Medical History  Diagnosis Date  . HYPERLIPIDEMIA 02/21/2009  . HYPOKALEMIA 08/17/2007  . ANEMIA, B12 DEFICIENCY 03/13/2009  . HYPERTENSION 05/14/2007  . Blood in stool 01/25/2008  . OSTEOARTHRITIS 05/14/2007  . OSTEOPENIA 05/14/2007    2007 hx of fosamax use   . TREMOR 07/08/2007  . FREQUENCY, URINARY 08/19/2008  . LUMBAR STRAIN 09/21/2007  . Gastric polyp     panendo  5 2009  . History of ankle fracture   . Parkinson's disease   . Adenomatous colon polyp   . Lumbar compression fracture      Past Surgical History  Procedure Laterality Date  . Abdominal hysterectomy    . Fracture  2007    left ankle- Hx of fosamax use  . Dilation and curettage of uterus    . Cataract extraction, bilateral    . Colonoscopy N/A 07/19/2013    Procedure: COLONOSCOPY;  Surgeon: Lafayette Dragon, MD;  Location: [facility];  Service: Endoscopy;  Laterality: N/A;      Social History:  reports that she has never smoked. She has never used smokeless tobacco. She reports that she does not drink alcohol or use illicit drugs.    No Known Allergies  Family History  Problem Relation Age of Onset  . Heart attack Mother   . Coronary artery disease Mother   . Colon cancer Sister     x 2  . Breast cancer Sister     x 4  . Parkinsonism Brother   . Peripheral vascular disease  sibling  . Stroke Father   . Colon cancer Other     nephew  . Colon cancer Paternal Aunt   . Breast cancer Sister   . Breast cancer Sister      Prior to Admission medications   Medication Sig Start Date End Date Taking? Authorizing Provider  aspirin 81 MG tablet Take 81 mg by mouth daily with breakfast.    Yes Historical Provider, MD  carbidopa-levodopa (SINEMET IR)  25-100 MG per tablet Take 0.5-1 tablets by mouth 3 (three) times daily. Take 1 tablet in the morning, 1/2 tablet at 1600, 1 tablet at bedtime   Yes Historical Provider, MD  Cholecalciferol (VITAMIN D3) 1000 UNITS CAPS Take 1,000 Units by mouth daily with breakfast.    Yes Historical Provider, MD  cyanocobalamin (,VITAMIN B-12,) 1000 MCG/ML injection Inject 1 mL (1,000 mcg total) into the muscle every 30 (thirty) days. 07/05/14  Yes Burnis Medin, MD  fluticasone (FLONASE) 50 MCG/ACT nasal spray Place 2 sprays into both nostrils at bedtime.   Yes Historical Provider, MD  hydrALAZINE (APRESOLINE) 10 MG tablet Take 1 tablet (10 mg total) by mouth 2 (two) times daily. 09/20/14  Yes Evans Lance, MD  HYDROcodone-acetaminophen (NORCO/VICODIN) 5-325 MG per tablet Take 1 tablet by mouth every 6 (six) hours as needed (for back pain).  10/06/14  Yes Historical Provider, MD  lisinopril (PRINIVIL,ZESTRIL) 10 MG tablet TAKE 1 TABLET ONCE DAILY. 10/19/14  Yes Thompson Grayer, MD  LORazepam (ATIVAN) 0.5 MG tablet TAKE 1/2 TO 1 TABLET TWICE DAILY AS NEEDED FOR ANXIETY. 11/08/14  Yes Burnis Medin, MD  Magnesium 250 MG TABS Take 250 mg by mouth daily with breakfast.    Yes Historical Provider, MD  ondansetron (ZOFRAN) 4 MG tablet Take 4 mg by mouth every 8 (eight) hours as needed for nausea or vomiting.   Yes Historical Provider, MD  pantoprazole (PROTONIX) 40 MG tablet Take 40 mg by mouth daily.   Yes Historical Provider, MD  Polyethyl Glycol-Propyl Glycol (SYSTANE) 0.4-0.3 % SOLN Apply 1 drop to eye 2 (two) times daily.    Yes Historical Provider, MD  polyethylene glycol (MIRALAX / GLYCOLAX) packet Take 17 g by mouth daily as needed for mild constipation.    Yes Historical Provider, MD  potassium chloride (K-DUR) 10 MEQ tablet Take 10 mEq by mouth 2 (two) times daily.    Yes Historical Provider, MD  PRESCRIPTION MEDICATION Steroid injection in lumbar area - at dr's office   Yes Historical Provider, MD  sertraline  (ZOLOFT) 25 MG tablet Take 1 tablet (25 mg total) by mouth daily. Patient taking differently: Take 25 mg by mouth at bedtime.  11/18/14  Yes Burnis Medin, MD  triamterene-hydrochlorothiazide (MAXZIDE-25) 37.5-25 MG per tablet Take 1 tablet by mouth daily with breakfast.    Yes Historical Provider, MD     Physical Exam: Filed Vitals:   12/08/14 1030 12/08/14 1100 12/08/14 1200 12/08/14 1306  BP: 153/63 141/55 140/51 143/66  Pulse: 78 64  69  Temp:      TempSrc:      Resp: 17 19 22 19   SpO2: 100% 98%  99%     Constitutional: Vital signs reviewChronically ill-appearing,and oriented x3.  Head: Normocephalic and atraumatic  Ear: TM normal bilaterally  Mouth: no erythema or exudates, MMM  Eyes: PERRL, EOMI, conjunctivae normal, No scleral icterus.  Neck: Supple, Trachea midline normal ROM, No JVD, mass, thyromegaly, or carotid bruit present.  Cardiovascular: RRR, S1 normal, S2 normal,  no MRG, pulses symmetric and intact bilaterally  Pulmonary/Chest: CTAB, no wheezes, rales, or rhonchi  Abdominal: Soft. Non-tender, non-distended, bowel sounds are normal, no masses, organomegaly, or guarding present.  GU: no CVA tenderness Musculoskeletal: No joint deformities, erythema, or stiffness, ROM full and no nontender Ext: no edema and no cyanosis, pulses palpable bilaterally (DP and PT)  Hematology: no cervical, inginal, or axillary adenopathy.  Neurological: A&O x3, Strenght is normal and symmetric bilaterally, cranial nerve II-XII are grossly intact, no focal motor deficit, sensory intact to light touch bilaterally.  Skin: Warm, dry and intact. No rash, cyanosis, or clubbing.  Psychiatric: Normal mood and affect. speech and behavior is normal. Judgment and thought content normal. Cognition and memory are normal.       Labs on Admission:    Basic Metabolic Panel:  Recent Labs Lab 12/08/14 1036  NA 131*  K 4.0  CL 98  CO2 26  GLUCOSE 105*  BUN 15  CREATININE 1.12*  CALCIUM 9.3    Liver Function Tests:  Recent Labs Lab 12/08/14 1036  AST 11  ALT <5  ALKPHOS 59  BILITOT 0.5  PROT 6.6  ALBUMIN 3.8    Recent Labs Lab 12/08/14 1036  LIPASE 27   No results for input(s): AMMONIA in the last 168 hours. CBC:  Recent Labs Lab 12/08/14 1036  WBC 7.0  NEUTROABS 5.5  HGB 10.7*  HCT 32.5*  MCV 82.9  PLT 269   Cardiac Enzymes: No results for input(s): CKTOTAL, CKMB, CKMBINDEX, TROPONINI in the last 168 hours.  BNP (last 3 results) No results for input(s): PROBNP in the last 8760 hours.    CBG: No results for input(s): GLUCAP in the last 168 hours.  Radiological Exams on Admission: Dg Abd Acute W/chest  12/08/2014   CLINICAL DATA:  Nausea and vomiting, decreased oral intake  EXAM: ACUTE ABDOMEN SERIES (ABDOMEN 2 VIEW & CHEST 1 VIEW)  COMPARISON:  08/22/2014  FINDINGS: The heart and pulmonary vascularity are within normal limits. Elevation of left hemidiaphragm is again seen. No acute bony abnormality is noted.  And scattered large and small bowel gas is seen. No free air is noted. No obstructive changes are seen. No abnormal mass or abnormal calcifications are noted. Old compression deformities are noted within the lumbar spine with evidence of a degenerative scoliosis concave to the right in the lumbar spine.  IMPRESSION: L3 compression deformity consistent with the patient's given clinical history. No acute abnormality is noted in the chest and abdomen.   Electronically Signed   By: Inez Catalina M.D.   On: 12/08/2014 13:13    EKG: Independently revi sessment/Plan Active Problems:   Dehydration, moderate   UTI Will start the patient on Rocephin Follow urine culture  Hyponatremia Could be secondary to poor oral intake and dehydration but also secondary to backside Discontinue Maxzide and start the patient on IV hydration Check TSH   L3 compression fracture Patient received epidural steroid injection last week for this Patient has been told  that she is not a candidate for surgery for lumbar spinal stenosis  Hypertension Will continue hydralazine, lisinopril  Parkinson's disease Continue with Sinemet     Code Status:   full Family Communication: bedside Disposition Plan: admit   Time spent: 70 mins   [facility] Pager [phone]  If 7PM-7AM, please contact night-coverage [email or web address] Password [email or web address] 12/08/2014, 1:45 PM

## 2014-12-08 NOTE — ED Notes (Signed)
Per EMS- Patient is a resident of [facility]. Patient has had nausea and weakness x 1 month. Patient also has a back fracture and has been on hydrocodone approx 1 month as well. Patient was being treated with Zofran at the facility with no relief. Patient has had decreased po intake due tp nausea.

## 2014-12-08 NOTE — ED Notes (Signed)
Patient transported to X-ray 

## 2014-12-08 NOTE — Telephone Encounter (Signed)
[facility] - Client [facility] Patient Name: Kimberly Silva Gender: Female DOB: March 30, 1926 Age: 80 Y 69 M 2 D Return Phone Number: [phone] (Primary), [phone] (Secondary) Address[place] [address] Client [facility] - Client Client [facility] - Night Physician Shanon Ace Contact Type Call Call Type Triage / Clinical Caller Name Margie Hodgin Relationship To Patient Daughter Return Phone Number [phone] (Primary) Chief Complaint Dehydration Initial Comment Caller states her mother has a hx of chronic back pain. She has not been eating well and is always nauseated. Daughter feels like she might be malnourished and dehydrated. Nurse Assessment Nurse: Myrle Sheng RN, [facility] Date/Time Eilene Ghazi Time): 12/07/2014 7:58:12 PM Confirm and document reason for call. If symptomatic, describe symptoms. ---Caller states her mother has a hx of chronic back pain. She has not been eating well and is always nauseated, caller states feels like she might be malnourished and dehydrated. Her mom lives in a retirement facility. The doctor is aware of these issues. Has been hospitalized a couple times because of heart and blood pressure issues. She recently was dx with spinal fx and stenosis. She is in a lot of pain. She is on Vicodin and Zofran. She also has Parkinson's. She has been making statements that she is miserable and thinks she needs to go to the hospital. 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 She has lost a lot of weight. Wants mother seen tomorrow. She is fine tonight and is not critical but just wants appt. Has the patient traveled out of the country within the last 30 days? ---Not Applicable Does the patient require triage? ---Declined Triage Please document clinical information provided and list any resource used. ---I advised  caller that she can call the office first thing in the morning and go from there, if anything changes tonight, she can give [place] a call back, she agrees and states understanding. Guidelines Guideline Title Affirmed Question Affirmed Notes Nurse Date/Time (Eastern Time) Disp. Time Eilene Ghazi Time) Disposition Final User 12/07/2014 8:06:37 PM Clinical Call Yes Myrle Sheng, RN, [facility] After Care Instructions Given Call Event Type User Date / Time Description

## 2014-12-09 ENCOUNTER — Inpatient Hospital Stay (HOSPITAL_COMMUNITY): Payer: Medicare Other

## 2014-12-09 LAB — COMPREHENSIVE METABOLIC PANEL
ALK PHOS: 51 U/L (ref 39–117)
AST: 9 U/L (ref 0–37)
Albumin: 3.1 g/dL — ABNORMAL LOW (ref 3.5–5.2)
Anion gap: 5 (ref 5–15)
BUN: 13 mg/dL (ref 6–23)
CO2: 22 mmol/L (ref 19–32)
Calcium: 8.2 mg/dL — ABNORMAL LOW (ref 8.4–10.5)
Chloride: 98 mEq/L (ref 96–112)
Creatinine, Ser: 0.96 mg/dL (ref 0.50–1.10)
GFR calc non Af Amer: 52 mL/min — ABNORMAL LOW (ref >90)
GFR, EST AFRICAN AMERICAN: 60 mL/min — AB (ref >90)
Glucose, Bld: 103 mg/dL — ABNORMAL HIGH (ref 70–99)
POTASSIUM: 3.5 mmol/L (ref 3.5–5.1)
SODIUM: 125 mmol/L — AB (ref 135–145)
TOTAL PROTEIN: 5.5 g/dL — AB (ref 6.0–8.3)
Total Bilirubin: 0.3 mg/dL (ref 0.3–1.2)

## 2014-12-09 LAB — BASIC METABOLIC PANEL
Anion gap: 8 (ref 5–15)
BUN: 13 mg/dL (ref 6–23)
CO2: 22 mmol/L (ref 19–32)
CREATININE: 0.91 mg/dL (ref 0.50–1.10)
Calcium: 8.7 mg/dL (ref 8.4–10.5)
Chloride: 105 mEq/L (ref 96–112)
GFR calc Af Amer: 64 mL/min — ABNORMAL LOW (ref >90)
GFR calc non Af Amer: 56 mL/min — ABNORMAL LOW (ref >90)
GLUCOSE: 94 mg/dL (ref 70–99)
Potassium: 4.3 mmol/L (ref 3.5–5.1)
Sodium: 135 mmol/L (ref 135–145)

## 2014-12-09 LAB — CBC
HCT: 28.1 % — ABNORMAL LOW (ref 36.0–46.0)
HEMOGLOBIN: 9.3 g/dL — AB (ref 12.0–15.0)
MCH: 27.7 pg (ref 26.0–34.0)
MCHC: 33.1 g/dL (ref 30.0–36.0)
MCV: 83.6 fL (ref 78.0–100.0)
Platelets: 233 10*3/uL (ref 150–400)
RBC: 3.36 MIL/uL — ABNORMAL LOW (ref 3.87–5.11)
RDW: 13.6 % (ref 11.5–15.5)
WBC: 6.7 10*3/uL (ref 4.0–10.5)

## 2014-12-09 LAB — OSMOLALITY, URINE: OSMOLALITY UR: 476 mosm/kg (ref 390–1090)

## 2014-12-09 LAB — OSMOLALITY: Osmolality: 278 mOsm/kg (ref 275–300)

## 2014-12-09 LAB — TROPONIN I: Troponin I: <0.03 ng/mL (ref <0.031)

## 2014-12-09 MED ORDER — SODIUM CHLORIDE 1 G PO TABS
1.0000 g | ORAL_TABLET | Freq: Three times a day (TID) | ORAL | Status: DC
  Administered 2014-12-09 – 2014-12-12 (×10): 1 g via ORAL
  Filled 2014-12-09 (×14): qty 1

## 2014-12-09 MED ORDER — MAGNESIUM OXIDE 400 (241.3 MG) MG PO TABS
400.0000 mg | ORAL_TABLET | Freq: Every day | ORAL | Status: DC
  Administered 2014-12-09 – 2014-12-12 (×4): 400 mg via ORAL
  Filled 2014-12-09 (×4): qty 1

## 2014-12-09 NOTE — Progress Notes (Addendum)
[facility] PROGRESS NOTE  Kimberly Silva NIO:[id number] DOB: 20-Sep-1926 DOA: 12/08/2014 PCP: Lottie Dawson, MD  Assessment/Plan: Active Problems:   Dehydration, moderate     UTI Continue Rocephin Follow urine culture  Hyponatremia-worsening Could be secondary to poor oral intake and dehydration but also secondary to [employer] , discontinue IV fluids TSH within normal limits Serum osmolality, urine osmolality, urine sodium pending Start patient on sodium chloride, fluid restriction    L3 compression fracture Patient received epidural steroid injection last week for this Patient has been told that she is not a candidate for surgery for lumbar spinal stenosis  Hypertension Will continue hydralazine, lisinopril  Parkinson's disease Continue with Sinemet    Code Status: full Family Communication: family updated about patient's clinical progress Disposition Plan:  As above    Brief narrative: 80 year old female with a history of hypertension, lumbar spinal stenosis, living in [facility] [place], presents with nausea and weakness for the last 1 month. Patient had a fall one month ago for which he has been taking hydrocodone. She has not been constipated and usually takes miralax for constipation however she has had nausea and poor oral intake for which she has been taking Zofran with no relief. She denies any fever chills, denies any shortness of breath or chest pain. She was admitted in September for bradycardia secondary to AV nodal blocking medications. Her Coreg was discontinued. This has not been an issue since. Patient was recently told that she has a L3 compression fracture last week. rx with epidural steroid injection last week   Consultants:  None  Procedures:  None  Antibiotics: Rocephin  HPI/Subjective: I did not feel well, no denies any nausea vomiting shortness of breath chest pain abdominal  pain  Objective: Filed Vitals:   12/08/14 1500 12/08/14 1536 12/08/14 2150 12/09/14 0542  BP: 122/54 152/53 144/52 158/50  Pulse: 67 60 68 71  Temp:  98.4 F (36.9 C) 98.9 F (37.2 C) 98.9 F (37.2 C)  TempSrc:  Oral Oral Oral  Resp: 23 18 20 20   Height:  5\' 2"  (1.575 m)    Weight:  51.529 kg (113 lb 9.6 oz)    SpO2: 98% 98% 97% 96%    Intake/Output Summary (Last 24 hours) at 12/09/14 1157 Last data filed at 12/09/14 8182  Gross per 24 hour  Intake 2643.34 ml  Output    550 ml  Net 2093.34 ml    Exam:  General: alert & oriented x 3 In NAD  Cardiovascular: RRR, nl S1 s2  Respiratory: Decreased breath sounds at the bases, scattered rhonchi, no crackles  Abdomen: soft +BS NT/ND, no masses palpable  Extremities: No cyanosis and no edema      Data Reviewed: Basic Metabolic Panel:  Recent Labs Lab 12/08/14 1036 12/08/14 1421 12/09/14 0158  NA 131*  --  125*  K 4.0  --  3.5  CL 98  --  98  CO2 26  --  22  GLUCOSE 105*  --  103*  BUN 15  --  13  CREATININE 1.12*  --  0.96  CALCIUM 9.3  --  8.2*  MG  --  1.7  --     Liver Function Tests:  Recent Labs Lab 12/08/14 1036 12/09/14 0158  AST 11 9  ALT <5 <5  ALKPHOS 59 51  BILITOT 0.5 0.3  PROT 6.6 5.5*  ALBUMIN 3.8 3.1*    Recent Labs Lab 12/08/14 1036  LIPASE 27   No  results for input(s): AMMONIA in the last 168 hours.  CBC:  Recent Labs Lab 12/08/14 1036 12/09/14 0158  WBC 7.0 6.7  NEUTROABS 5.5  --   HGB 10.7* 9.3*  HCT 32.5* 28.1*  MCV 82.9 83.6  PLT 269 233    Cardiac Enzymes:  Recent Labs Lab 12/08/14 1421 12/08/14 1940 12/09/14 0158  TROPONINI <0.03 <0.03 <0.03   BNP (last 3 results) No results for input(s): PROBNP in the last 8760 hours.   CBG: No results for input(s): GLUCAP in the last 168 hours.  No results found for this or any previous visit (from the past 240 hour(s)).   Studies: Dg Chest 2 View  12/09/2014   CLINICAL DATA:  Cough and hypertension  EXAM:  CHEST  2 VIEW  COMPARISON:  12/08/2014  FINDINGS: Cardiac shadow is within normal limits. Elevation of the left hemidiaphragm is again seen. Lungs are well aerated bilaterally without focal infiltrate or sizable effusion. Mild degenerative changes of the thoracic spine are noted. A mid thoracic compression deformity is noted better visualized on the current exam.  IMPRESSION: Thoracic compression deformity of uncertain age. No other focal abnormality is seen.   Electronically Signed   By: Inez Catalina M.D.   On: 12/09/2014 10:17   Dg Abd Acute W/chest  12/08/2014   CLINICAL DATA:  Nausea and vomiting, decreased oral intake  EXAM: ACUTE ABDOMEN SERIES (ABDOMEN 2 VIEW & CHEST 1 VIEW)  COMPARISON:  08/22/2014  FINDINGS: The heart and pulmonary vascularity are within normal limits. Elevation of left hemidiaphragm is again seen. No acute bony abnormality is noted.  And scattered large and small bowel gas is seen. No free air is noted. No obstructive changes are seen. No abnormal mass or abnormal calcifications are noted. Old compression deformities are noted within the lumbar spine with evidence of a degenerative scoliosis concave to the right in the lumbar spine.  IMPRESSION: L3 compression deformity consistent with the patient's given clinical history. No acute abnormality is noted in the chest and abdomen.   Electronically Signed   By: Inez Catalina M.D.   On: 12/08/2014 13:13    Scheduled Meds: . aspirin  81 mg Oral Q breakfast  . carbidopa-levodopa  0.5 tablet Oral Q24H  . carbidopa-levodopa  1 tablet Oral BH-qamhs  . cefTRIAXone (ROCEPHIN)  IV  1 g Intravenous Q24H  . [START ON 12/28/2014] cyanocobalamin  1,000 mcg Intramuscular Q30 days  . docusate sodium  100 mg Oral BID  . feeding supplement (ENSURE COMPLETE)  237 mL Oral BID BM  . fluticasone  2 spray Each Nare QHS  . hydrALAZINE  10 mg Oral BID  . magnesium gluconate  250 mg Oral Q breakfast  . magnesium oxide  400 mg Oral Daily  .  pantoprazole  40 mg Oral Daily  . polyvinyl alcohol  1 drop Both Eyes BID  . potassium chloride  10 mEq Oral BID  . sodium chloride  1 g Oral TID WC   Continuous Infusions:   Active Problems:   Dehydration, moderate    Time spent: 40 minutes   [facility] Pager [phone]. If 7PM-7AM, please contact night-coverage at [email or web address], password [email or web address] 12/09/2014, 11:57 AM  LOS: 1 day

## 2014-12-10 LAB — URINE CULTURE: Colony Count: 25000

## 2014-12-10 LAB — BASIC METABOLIC PANEL
Anion gap: 5 (ref 5–15)
BUN: 12 mg/dL (ref 6–23)
CHLORIDE: 104 meq/L (ref 96–112)
CO2: 23 mmol/L (ref 19–32)
Calcium: 8.5 mg/dL (ref 8.4–10.5)
Creatinine, Ser: 0.9 mg/dL (ref 0.50–1.10)
GFR calc Af Amer: 65 mL/min — ABNORMAL LOW (ref >90)
GFR calc non Af Amer: 56 mL/min — ABNORMAL LOW (ref >90)
Glucose, Bld: 98 mg/dL (ref 70–99)
Potassium: 3.8 mmol/L (ref 3.5–5.1)
SODIUM: 132 mmol/L — AB (ref 135–145)

## 2014-12-10 LAB — SODIUM, URINE, RANDOM: Sodium, Ur: 124 mEq/L

## 2014-12-10 NOTE — Progress Notes (Signed)
[facility] PROGRESS NOTE  ABEGAIL KLOEPPEL WJX:[id number] DOB: [birth year] DOA: 12/08/2014 PCP: Lottie Dawson, MD  Assessment/Plan: Active Problems:   Dehydration, moderate     UTI Continue Rocephin Follow urine culture  Hyponatremia-improving Could be secondary to poor oral intake and dehydration but also secondary to [employer] , discontinue IV fluids TSH within normal limits Serum osmolality, urine osmolality 476, urine sodium 123 Continue sodium chloride tablets, fluid restriction    L3 compression fracture Patient received epidural steroid injection last week for this Patient has been told that she is not a candidate for surgery for lumbar spinal stenosis PT/OT eval  Hypertension Will continue hydralazine, lisinopril  Parkinson's disease Continue with Sinemet    Code Status: full Family Communication: family updated about patient's clinical progress Disposition Plan:  Anticipate discharge tomorrow if able   Brief narrative: 80 year old female with a history of hypertension, lumbar spinal stenosis, living in [facility], presents with nausea and weakness for the last 1 month. Patient had a fall one month ago for which he has been taking hydrocodone. She has not been constipated and usually takes miralax for constipation however she has had nausea and poor oral intake for which she has been taking Zofran with no relief. She denies any fever chills, denies any shortness of breath or chest pain. She was admitted in September for bradycardia secondary to AV nodal blocking medications. Her Coreg was discontinued. This has not been an issue since. Patient was recently told that she has a L3 compression fracture last week. rx with epidural steroid injection last week   Consultants:  None  Procedures:  None  Antibiotics: Rocephin  HPI/Subjective: I did not feel well, no denies any nausea vomiting shortness of breath  chest pain abdominal pain  Objective: Filed Vitals:   12/09/14 0542 12/09/14 1357 12/09/14 2025 12/10/14 0638  BP: 158/50 125/41 132/53 152/62  Pulse: 71 70 63 70  Temp: 98.9 F (37.2 C) 98 F (36.7 C) 99.5 F (37.5 C) 99.1 F (37.3 C)  TempSrc: Oral Oral Oral Oral  Resp: 20 18 18 18   Height:      Weight:      SpO2: 96% 98% 98% 99%    Intake/Output Summary (Last 24 hours) at 12/10/14 1156 Last data filed at 12/10/14 0820  Gross per 24 hour  Intake    600 ml  Output    450 ml  Net    150 ml    Exam:  General: alert & oriented x 3 In NAD  Cardiovascular: RRR, nl S1 s2  Respiratory: Decreased breath sounds at the bases, scattered rhonchi, no crackles  Abdomen: soft +BS NT/ND, no masses palpable  Extremities: No cyanosis and no edema      Data Reviewed: Basic Metabolic Panel:  Recent Labs Lab 12/08/14 1036 12/08/14 1421 12/09/14 0158 12/09/14 1630 12/10/14 0438  NA 131*  --  125* 135 132*  K 4.0  --  3.5 4.3 3.8  CL 98  --  98 105 104  CO2 26  --  22 22 23   GLUCOSE 105*  --  103* 94 98  BUN 15  --  13 13 12   CREATININE 1.12*  --  0.96 0.91 0.90  CALCIUM 9.3  --  8.2* 8.7 8.5  MG  --  1.7  --   --   --     Liver Function Tests:  Recent Labs Lab 12/08/14 1036 12/09/14 0158  AST 11 9  ALT <5 <  5  ALKPHOS 59 51  BILITOT 0.5 0.3  PROT 6.6 5.5*  ALBUMIN 3.8 3.1*    Recent Labs Lab 12/08/14 1036  LIPASE 27   No results for input(s): AMMONIA in the last 168 hours.  CBC:  Recent Labs Lab 12/08/14 1036 12/09/14 0158  WBC 7.0 6.7  NEUTROABS 5.5  --   HGB 10.7* 9.3*  HCT 32.5* 28.1*  MCV 82.9 83.6  PLT 269 233    Cardiac Enzymes:  Recent Labs Lab 12/08/14 1421 12/08/14 1940 12/09/14 0158  TROPONINI <0.03 <0.03 <0.03   BNP (last 3 results) No results for input(s): PROBNP in the last 8760 hours.   CBG: No results for input(s): GLUCAP in the last 168 hours.  Recent Results (from the past 240 hour(s))  Urine culture      Status: None   Collection Time: 12/08/14  1:50 PM  Result Value Ref Range Status   Specimen Description URINE, RANDOM  Final   Special Requests NONE  Final   Colony Count   Final    25,000 COLONIES/ML Performed at [facility]    Culture   Final    Multiple bacterial morphotypes present, none predominant. Suggest appropriate recollection if clinically indicated. Performed at [facility]    Report Status 12/10/2014 FINAL  Final     Studies: Dg Chest 2 View  12/09/2014   CLINICAL DATA:  Cough and hypertension  EXAM: CHEST  2 VIEW  COMPARISON:  12/08/2014  FINDINGS: Cardiac shadow is within normal limits. Elevation of the left hemidiaphragm is again seen. Lungs are well aerated bilaterally without focal infiltrate or sizable effusion. Mild degenerative changes of the thoracic spine are noted. A mid thoracic compression deformity is noted better visualized on the current exam.  IMPRESSION: Thoracic compression deformity of uncertain age. No other focal abnormality is seen.   Electronically Signed   By: Inez Catalina M.D.   On: 12/09/2014 10:17   Dg Abd Acute W/chest  12/08/2014   CLINICAL DATA:  Nausea and vomiting, decreased oral intake  EXAM: ACUTE ABDOMEN SERIES (ABDOMEN 2 VIEW & CHEST 1 VIEW)  COMPARISON:  08/22/2014  FINDINGS: The heart and pulmonary vascularity are within normal limits. Elevation of left hemidiaphragm is again seen. No acute bony abnormality is noted.  And scattered large and small bowel gas is seen. No free air is noted. No obstructive changes are seen. No abnormal mass or abnormal calcifications are noted. Old compression deformities are noted within the lumbar spine with evidence of a degenerative scoliosis concave to the right in the lumbar spine.  IMPRESSION: L3 compression deformity consistent with the patient's given clinical history. No acute abnormality is noted in the chest and abdomen.   Electronically Signed   By: Inez Catalina M.D.   On:  12/08/2014 13:13    Scheduled Meds: . aspirin  81 mg Oral Q breakfast  . carbidopa-levodopa  0.5 tablet Oral Q24H  . carbidopa-levodopa  1 tablet Oral BH-qamhs  . cefTRIAXone (ROCEPHIN)  IV  1 g Intravenous Q24H  . [START ON 12/28/2014] cyanocobalamin  1,000 mcg Intramuscular Q30 days  . docusate sodium  100 mg Oral BID  . feeding supplement (ENSURE COMPLETE)  237 mL Oral BID BM  . fluticasone  2 spray Each Nare QHS  . hydrALAZINE  10 mg Oral BID  . magnesium oxide  400 mg Oral Daily  . pantoprazole  40 mg Oral Daily  . polyvinyl alcohol  1 drop Both Eyes BID  .  potassium chloride  10 mEq Oral BID  . sodium chloride  1 g Oral TID WC   Continuous Infusions:   Active Problems:   Dehydration, moderate    Time spent: 40 minutes   [facility] Pager [phone]. If 7PM-7AM, please contact night-coverage at [email or web address], password [facility] 12/10/2014, 11:56 AM  LOS: 2 days

## 2014-12-10 NOTE — Evaluation (Signed)
Physical Therapy Evaluation Patient Details Name: Kimberly Silva MRN: [id number] DOB: [birth year] Today's Date: 12/10/2014   History of Present Illness  Pt was admitted for dehydration.  She has a h/o Parkinson's Disease and has recent L3 compression fx for which she received an injection  H/O fall approximately one month ago  Clinical Impression  Pt admitted with dehydration and presenting with functional mobility limitations 2* R LE discomfort and endurance limitations.  Pt excited by ability to ambulate distance she did with min discomfort and hoping to d.c back to Independent Living apartment.  Pt could benefit from follow up HHPT at apt but states she is willing to consider SNF level rehab at [facility] if she does not progress well enough to return home.    Follow Up Recommendations Home health PT    Equipment Recommendations  None recommended by PT    Recommendations for Other Services OT consult     Precautions / Restrictions Precautions Precautions: Fall Restrictions Weight Bearing Restrictions: No      Mobility  Bed Mobility Overal bed mobility: Modified Independent             General bed mobility comments: No assist back into bed  Transfers Overall transfer level: Needs assistance Equipment used: Rolling walker (2 wheeled) Transfers: Sit to/from Stand Sit to Stand: Min guard         General transfer comment: cues for UE placement; pt is used to 4 wheel walker with brakes  Ambulation/Gait Ambulation/Gait assistance: Min guard Ambulation Distance (Feet): 150 Feet (distance ltd by R LE discomfort) Assistive device: Rolling walker (2 wheeled) Gait Pattern/deviations: Step-through pattern;Decreased step length - right;Decreased step length - left;Shuffle;Trunk flexed Gait velocity: mod pace   General Gait Details: min cues for posture and position from RW - pt is used to 4 wheeled [employer] (Stroke Patients Only)       Balance                                             Pertinent Vitals/Pain Pain Assessment: 0-10 Pain Score: 2  Pain Location: R LE Pain Descriptors / Indicators: Aching Pain Intervention(s): Limited activity within patient's tolerance;Monitored during session    Home Living Family/patient expects to be discharged to:: Private residence Living Arrangements: Alone Available Help at Discharge: Available PRN/intermittently Type of Home: Independent living facility Home Access: Level entry     Home Layout: One level Home Equipment: Environmental consultant - 4 wheels;Cane - single point;Grab bars - tub/shower;Grab bars - toilet;Tub bench;Hand held shower head Additional Comments: [facility] is from [facility] independent living    Prior Function Level of Independence: Independent with assistive device(s)         Comments: since last week, she has gotten meals in her room     Hand Dominance   Dominant Hand: Right    Extremity/Trunk Assessment   Upper Extremity Assessment: Overall WFL for tasks assessed           Lower Extremity Assessment: Overall WFL for tasks assessed      Cervical / Trunk Assessment: Kyphotic  Communication   Communication: No difficulties  Cognition Arousal/Alertness: Awake/alert Behavior During Therapy: WFL for tasks assessed/performed Overall Cognitive Status: Within Functional Limits for tasks assessed  General Comments General comments (skin integrity, edema, etc.): pt shaky:  she has a h/o Parkinson's and states that sometimes shaking is worse than other times    Exercises        Assessment/Plan    PT Assessment Patient needs continued PT services  PT Diagnosis Difficulty walking   PT Problem List Decreased activity tolerance;Decreased mobility;Pain  PT Treatment Interventions DME instruction;Gait training;Functional mobility training;Therapeutic  activities;Patient/family education   PT Goals (Current goals can be found in the Care Plan section) Acute Rehab PT Goals Patient Stated Goal: get strength back PT Goal Formulation: With patient Time For Goal Achievement: 12/24/14 Potential to Achieve Goals: Good    Frequency Min 3X/week   Barriers to discharge        Co-evaluation               End of Session Equipment Utilized During Treatment: Gait belt Activity Tolerance: Patient tolerated treatment well Patient left: in bed;with call bell/phone within reach Nurse Communication: Mobility status         Time: 7972-8206 PT Time Calculation (min) (ACUTE ONLY): 21 min   Charges:   PT Evaluation $Initial PT Evaluation Tier I: 1 Procedure PT Treatments $Gait Training: 8-22 mins   PT G Codes:        Lot [place] 12/12/14, 1:55 PM

## 2014-12-10 NOTE — Evaluation (Signed)
Occupational Therapy Evaluation Patient Details Name: Kimberly Silva MRN: [id number] DOB: 09-16-1926 Today's Date: 12/10/2014    History of Present Illness Pt was admitted for dehydration.  She has a h/o Parkinson's Disease and has recent L3 compression fx for which she received an injection  H/O fall approximately one month ago   Clinical Impression   This 80 year old female was admitted for the above.  At baseline, pt functions at mod I level.  Since last week, she has taken meals in her room and had assistance with laundry.  Pt needs overall min A at this time.    Pt will benefit from skilled OT to increase safety and independence with adls    Follow Up Recommendations  SNF (pt willing to consider [facility] SN[facility])    Equipment Recommendations  None recommended by OT    Recommendations for Other Services       Precautions / Restrictions Precautions Precautions: Fall Restrictions Weight Bearing Restrictions: No      Mobility Bed Mobility Overal bed mobility: Modified Independent             General bed mobility comments: HOB raised  Transfers Overall transfer level: Needs assistance Equipment used: Rolling walker (2 wheeled) Transfers: Sit to/from Stand Sit to Stand: Min assist         General transfer comment: cues for UE placement; pt is used to 4 wheel walker with brakes    Balance                                            ADL Overall ADL's : Needs assistance/impaired     Grooming: Wash/dry hands;Supervision/safety;Standing   Upper Body Bathing: Set up;Sitting   Lower Body Bathing: Minimal assistance;Sit to/from stand   Upper Body Dressing : Set up;Sitting   Lower Body Dressing: Minimal assistance;Sit to/from stand   Toilet Transfer: Minimal assistance;Ambulation;Comfort height toilet;Grab bars             General ADL Comments: ambulated to bathroom and completed adl from commode.  Pt had a little pain after  sitting on commode.  She feels weaker than when she came in and was eager to sit up in chair to start regaining strength     Vision                     Perception     Praxis      Pertinent Vitals/Pain Pain Assessment: 0-10 Pain Score: 2  Pain Location: back  Pain Descriptors / Indicators: Sore Pain Intervention(s): Limited activity within patient's tolerance;Monitored during session;Repositioned     Hand Dominance     Extremity/Trunk Assessment Upper Extremity Assessment Upper Extremity Assessment: Overall WFL for tasks assessed           Communication Communication Communication: No difficulties   Cognition Arousal/Alertness: Awake/alert Behavior During Therapy: WFL for tasks assessed/performed Overall Cognitive Status: Within Functional Limits for tasks assessed                     General Comments       Exercises       Shoulder Instructions      Home Living  Additional Comments: pt is from [place], independent living      Prior Functioning/Environment          Comments: since last week, she has gotten meals in her room    OT Diagnosis: Generalized weakness   OT Problem List: Decreased strength;Decreased activity tolerance;Impaired balance (sitting and/or standing);Pain;Decreased knowledge of use of DME or AE   OT Treatment/Interventions: Self-care/ADL training;DME and/or AE instruction;Patient/family education;Balance training;Therapeutic activities    OT Goals(Current goals can be found in the care plan section) Acute Rehab OT Goals Patient Stated Goal: get strength back OT Goal Formulation: With patient Time For Goal Achievement: 12/17/14 Potential to Achieve Goals: Good ADL Goals Pt Will Perform Lower Body Bathing: with supervision;with adaptive equipment;sit to/from stand Pt Will Perform Lower Body Dressing: with supervision;with adaptive equipment;sit to/from  stand Pt Will Transfer to Toilet: with supervision;bedside commode;ambulating Pt Will Perform Toileting - Clothing Manipulation and hygiene: with supervision;sit to/from stand  OT Frequency: Min 2X/week   Barriers to D/C:            Co-evaluation              End of Session    Activity Tolerance: Patient tolerated treatment well Patient left: in chair;with call bell/phone within reach;with chair alarm set   Time: 0947-0962 OT Time Calculation (min): 34 min Charges:  OT General Charges $OT Visit: 1 Procedure OT Evaluation $Initial OT Evaluation Tier I: 1 Procedure OT Treatments $Self Care/Home Management : 23-37 mins G-Codes:    Kimberly Silva 01/06/15, 11:16 AM   Kimberly Silva, OTR/L [phone] 01-06-15

## 2014-12-10 NOTE — Progress Notes (Signed)
INITIAL NUTRITION ASSESSMENT  DOCUMENTATION CODES Per approved criteria  -Severe malnutrition in the context of acute illness or injury  Pt meets criteria for severe MALNUTRITION in the context of acute illness/injury as evidenced by 14% weight loss x 4 months and energy intake <75% for >1 month.  INTERVENTION: -Continue Ensure Complete po BID, each supplement provides 350 kcal and 13 grams of protein -Provide Magic cup TID with meals, each supplement provides 290 kcal and 9 grams of protein -Encourage PO intake -RD to continue to monitor  NUTRITION DIAGNOSIS: Inadequate oral intake related to decreased appetite as evidenced by 14% weight loss x 4 months.   Goal: Pt to meet >/= 90% of their estimated nutrition needs   Monitor:  PO and supplemental intake, weight, labs, I/O's  Reason for Assessment: Pt identified as at nutrition risk on the Malnutrition Screen Tool  Admitting Dx: Dehydration  ASSESSMENT: 80 year old female with a history of hypertension, lumbar spinal stenosis, living in [facility] [place], presents with nausea and weakness for the last 1 month. She has had nausea and poor oral intake for which she has been taking Zofran with no relief.  Pt was eating lunch during visit. Pt states that she feels like she cannot eat that much at a time. Encouraged pt to eat as much as she can tolerate.   Per weight history documentation, pt has lost 19 lb since August (14% weight loss x 4 months, significant for time frame).  Pt states that her appetite has been poor since taking pain medications. Says she had stopped going to the dining room for meals. For several weeks she would eat a slice of toast in the morning and that would be it.  Per documentation, pt was not drinking Ensures. Encouraged pt to drink as much as she can of the supplements. Pt interested in trying magic cups with meals.  Labs reviewed: Low Na  Height: Ht Readings from Last 1 Encounters:  12/08/14  5\' 2"  (1.575 m)    Weight: Wt Readings from Last 1 Encounters:  12/08/14 113 lb 9.6 oz (51.529 kg)    Ideal Body Weight: 110 lb  % Ideal Body Weight: 103%  Wt Readings from Last 10 Encounters:  12/08/14 113 lb 9.6 oz (51.529 kg)  11/08/14 120 lb 3.2 oz (54.522 kg)  10/13/14 119 lb (53.978 kg)  09/20/14 124 lb 9.6 oz (56.518 kg)  08/22/14 128 lb (58.06 kg)  08/17/14 127 lb 1.6 oz (57.652 kg)  08/03/14 132 lb (59.875 kg)  07/25/14 126 lb (57.153 kg)  07/05/14 128 lb (58.06 kg)  05/13/14 128 lb (58.06 kg)    Usual Body Weight: 130 lb  % Usual Body Weight: 87%  BMI:  Body mass index is 20.77 kg/(m^2).  Estimated Nutritional Needs: Kcal: 1300-1500 Protein: 60-70g Fluid: 1.5L/day  Skin: intact  Diet Order: Diet regular  EDUCATION NEEDS: -No education needs identified at this time   Intake/Output Summary (Last 24 hours) at 12/10/14 1345 Last data filed at 12/10/14 1119  Gross per 24 hour  Intake    530 ml  Output    350 ml  Net    180 ml    Last BM: 12/25  Labs:   Recent Labs Lab 12/08/14 1421 12/09/14 0158 12/09/14 1630 12/10/14 0438  NA  --  125* 135 132*  K  --  3.5 4.3 3.8  CL  --  98 105 104  CO2  --  22 22 23   BUN  --  13  13 12  CREATININE  --  0.96 0.91 0.90  CALCIUM  --  8.2* 8.7 8.5  MG 1.7  --   --   --   GLUCOSE  --  103* 94 98    CBG (last 3)  No results for input(s): GLUCAP in the last 72 hours.  Scheduled Meds: . aspirin  81 mg Oral Q breakfast  . carbidopa-levodopa  0.5 tablet Oral Q24H  . carbidopa-levodopa  1 tablet Oral BH-qamhs  . cefTRIAXone (ROCEPHIN)  IV  1 g Intravenous Q24H  . [START ON 12/28/2014] cyanocobalamin  1,000 mcg Intramuscular Q30 days  . docusate sodium  100 mg Oral BID  . feeding supplement (ENSURE COMPLETE)  237 mL Oral BID BM  . fluticasone  2 spray Each Nare QHS  . hydrALAZINE  10 mg Oral BID  . magnesium oxide  400 mg Oral Daily  . pantoprazole  40 mg Oral Daily  . polyvinyl alcohol  1 drop Both  Eyes BID  . potassium chloride  10 mEq Oral BID  . sodium chloride  1 g Oral TID WC    Continuous Infusions:   Past Medical History  Diagnosis Date  . HYPERLIPIDEMIA 02/21/2009  . HYPOKALEMIA 08/17/2007  . ANEMIA, B12 DEFICIENCY 03/13/2009  . HYPERTENSION 05/14/2007  . Blood in stool 01/25/2008  . OSTEOARTHRITIS 05/14/2007  . OSTEOPENIA 05/14/2007    2007 hx of fosamax use   . TREMOR 07/08/2007  . FREQUENCY, URINARY 08/19/2008  . LUMBAR STRAIN 09/21/2007  . Gastric polyp     panendo  5 2009  . History of ankle fracture   . Parkinson's disease   . Adenomatous colon polyp   . Lumbar compression fracture     Past Surgical History  Procedure Laterality Date  . Abdominal hysterectomy    . Fracture  2007    left ankle- Hx of fosamax use  . Dilation and curettage of uterus    . Cataract extraction, bilateral    . Colonoscopy N/A 07/19/2013    Procedure: COLONOSCOPY;  Surgeon: Lafayette Dragon, MD;  Location: [facility];  Service: Endoscopy;  Laterality: N/A;    Clayton Bibles, MS, RD, LDN Pager: [phone] After Hours Pager: [phone]

## 2014-12-11 DIAGNOSIS — E43 Unspecified severe protein-calorie malnutrition: Secondary | ICD-10-CM | POA: Insufficient documentation

## 2014-12-11 LAB — BASIC METABOLIC PANEL
Anion gap: 4 — ABNORMAL LOW (ref 5–15)
BUN: 14 mg/dL (ref 6–23)
CHLORIDE: 106 meq/L (ref 96–112)
CO2: 24 mmol/L (ref 19–32)
Calcium: 8.7 mg/dL (ref 8.4–10.5)
Creatinine, Ser: 0.8 mg/dL (ref 0.50–1.10)
GFR calc non Af Amer: 65 mL/min — ABNORMAL LOW (ref >90)
GFR, EST AFRICAN AMERICAN: 75 mL/min — AB (ref >90)
GLUCOSE: 92 mg/dL (ref 70–99)
Potassium: 3.5 mmol/L (ref 3.5–5.1)
SODIUM: 134 mmol/L — AB (ref 135–145)

## 2014-12-11 LAB — URINALYSIS, ROUTINE W REFLEX MICROSCOPIC
Bilirubin Urine: NEGATIVE
GLUCOSE, UA: NEGATIVE mg/dL
Hgb urine dipstick: NEGATIVE
KETONES UR: NEGATIVE mg/dL
Nitrite: NEGATIVE
Protein, ur: NEGATIVE mg/dL
SPECIFIC GRAVITY, URINE: 1.023 (ref 1.005–1.030)
Urobilinogen, UA: 0.2 mg/dL (ref 0.0–1.0)
pH: 5.5 (ref 5.0–8.0)

## 2014-12-11 LAB — CBC
HCT: 30.4 % — ABNORMAL LOW (ref 36.0–46.0)
HEMOGLOBIN: 9.9 g/dL — AB (ref 12.0–15.0)
MCH: 27.3 pg (ref 26.0–34.0)
MCHC: 32.6 g/dL (ref 30.0–36.0)
MCV: 84 fL (ref 78.0–100.0)
Platelets: 226 10*3/uL (ref 150–400)
RBC: 3.62 MIL/uL — ABNORMAL LOW (ref 3.87–5.11)
RDW: 13.6 % (ref 11.5–15.5)
WBC: 6.9 10*3/uL (ref 4.0–10.5)

## 2014-12-11 LAB — URINE MICROSCOPIC-ADD ON

## 2014-12-11 MED ORDER — HYDRALAZINE HCL 10 MG PO TABS
15.0000 mg | ORAL_TABLET | Freq: Two times a day (BID) | ORAL | Status: DC
  Administered 2014-12-11: 15 mg via ORAL
  Filled 2014-12-11 (×4): qty 2

## 2014-12-11 NOTE — Clinical Social Work Note (Signed)
CSW met with pt at bedside   Pt would like to return back to Independent living at [facility] if possible  CSW reviewed PT evaluation which stated home health PT  CSW left referral message for case manager with pt information  No further CSW needs   .Dede Query, LCSW [facility] Clinical Social Worker - Weekend Coverage cell #: [phone]

## 2014-12-11 NOTE — Progress Notes (Addendum)
[facility] PROGRESS NOTE  Kimberly Silva KTG:[id number] DOB: April 09, 1926 DOA: 12/08/2014 PCP: Lottie Dawson, MD  Assessment/Plan: Active Problems:   Dehydration, moderate   Protein-calorie malnutrition, severe     UTI Continue Rocephin, repeat UA, if UA negative will DC antibiotics Urine culture showed 25,000 colonies   Hyponatremia-improving Could be secondary to poor oral intake and dehydration but also secondary to [employer] , discontinue IV fluids TSH within normal limits Serum osmolality, urine osmolality 476, urine sodium 123 Continue sodium chloride tablets, fluid restriction BMP in the morning    L3 compression fracture Patient received epidural steroid injection last week for this Patient has been told that she is not a candidate for surgery for lumbar spinal stenosis PT/OT eval  Hypertension Will continue hydralazine,  Recommended patient to uptitrate hydralazine for uncontrolled blood pressure, patient should be able to discontinue her other antihypertensive medications   Parkinson's disease Continue with Sinemet    Code Status: full Family Communication: family updated about patient's clinical progress Disposition Plan:  [facility] OT recommends SNF and the patient would like to go to SNF   Brief narrative: 80 year old female with a history of hypertension, lumbar spinal stenosis, living in [facility] [place], presents with nausea and weakness for the last 1 month. Patient had a fall one month ago for which he has been taking hydrocodone. She has not been constipated and usually takes miralax for constipation however she has had nausea and poor oral intake for which she has been taking Zofran with no relief. She denies any fever chills, denies any shortness of breath or chest pain. She was admitted in September for bradycardia secondary to AV nodal blocking medications.  Her Coreg was discontinued. This has not been an issue since. Patient was recently told that she has a L3 compression fracture last week. rx with epidural steroid injection last week   Consultants:  None  Procedures:  None  Antibiotics: Rocephin  HPI/Subjective: I did not feel well, no denies any nausea vomiting shortness of breath chest pain abdominal pain  Objective: Filed Vitals:   12/10/14 0638 12/10/14 1428 12/10/14 2122 12/11/14 0500  BP: 152/62 143/62 127/43 166/67  Pulse: 70 70 70 65  Temp: 99.1 F (37.3 C) 98.1 F (36.7 C) 97.9 F (36.6 C) 98.1 F (36.7 C)  TempSrc: Oral Oral Oral Oral  Resp: 18 18 18 16   Height:      Weight:      SpO2: 99% 99% 97% 99%    Intake/Output Summary (Last 24 hours) at 12/11/14 1129 Last data filed at 12/11/14 0900  Gross per 24 hour  Intake    300 ml  Output    675 ml  Net   -375 ml    Exam:  General: alert & oriented x 3 In NAD  Cardiovascular: RRR, nl S1 s2  Respiratory: Decreased breath sounds at the bases, scattered rhonchi, no crackles  Abdomen: soft +BS NT/ND, no masses palpable  Extremities: No cyanosis and no edema      Data Reviewed: Basic Metabolic Panel:  Recent Labs Lab 12/08/14 1036 12/08/14 1421 12/09/14 0158 12/09/14 1630 12/10/14 0438 12/11/14 0505  NA 131*  --  125* 135 132* 134*  K 4.0  --  3.5 4.3 3.8 3.5  CL 98  --  98 105 104 106  CO2 26  --  22 22 23 24   GLUCOSE 105*  --  103* 94 98 92  BUN 15  --  13 13 12 14   CREATININE 1.12*  --  0.96 0.91 0.90 0.80  CALCIUM 9.3  --  8.2* 8.7 8.5 8.7  MG  --  1.7  --   --   --   --     Liver Function Tests:  Recent Labs Lab 12/08/14 1036 12/09/14 0158  AST 11 9  ALT <5 <5  ALKPHOS 59 51  BILITOT 0.5 0.3  PROT 6.6 5.5*  ALBUMIN 3.8 3.1*    Recent Labs Lab 12/08/14 1036  LIPASE 27   No results for input(s): AMMONIA in the last 168 hours.  CBC:  Recent Labs Lab 12/08/14 1036 12/09/14 0158 12/11/14 0505  WBC 7.0 6.7 6.9   NEUTROABS 5.5  --   --   HGB 10.7* 9.3* 9.9*  HCT 32.5* 28.1* 30.4*  MCV 82.9 83.6 84.0  PLT 269 233 226    Cardiac Enzymes:  Recent Labs Lab 12/08/14 1421 12/08/14 1940 12/09/14 0158  TROPONINI <0.03 <0.03 <0.03   BNP (last 3 results) No results for input(s): PROBNP in the last 8760 hours.   CBG: No results for input(s): GLUCAP in the last 168 hours.  Recent Results (from the past 240 hour(s))  Urine culture     Status: None   Collection Time: 12/08/14 11:22 AM  Result Value Ref Range Status   Specimen Description URINE, CLEAN CATCH  Final   Special Requests NONE  Final   Colony Count   Final    75,000 COLONIES/ML Performed at [facility]    Culture   Final    Multiple bacterial morphotypes present, none predominant. Suggest appropriate recollection if clinically indicated. Performed at [facility]    Report Status 12/10/2014 FINAL  Final  Urine culture     Status: None   Collection Time: 12/08/14  1:50 PM  Result Value Ref Range Status   Specimen Description URINE, RANDOM  Final   Special Requests NONE  Final   Colony Count   Final    25,000 COLONIES/ML Performed at [facility]    Culture   Final    Multiple bacterial morphotypes present, none predominant. Suggest appropriate recollection if clinically indicated. Performed at [facility]    Report Status 12/10/2014 FINAL  Final     Studies: Dg Chest 2 View  12/09/2014   CLINICAL DATA:  Cough and hypertension  EXAM: CHEST  2 VIEW  COMPARISON:  12/08/2014  FINDINGS: Cardiac shadow is within normal limits. Elevation of the left hemidiaphragm is again seen. Lungs are well aerated bilaterally without focal infiltrate or sizable effusion. Mild degenerative changes of the thoracic spine are noted. A mid thoracic compression deformity is noted better visualized on the current exam.  IMPRESSION: Thoracic compression deformity of uncertain age. No other focal abnormality is seen.    Electronically Signed   By: Inez Catalina M.D.   On: 12/09/2014 10:17   Dg Abd Acute W/chest  12/08/2014   CLINICAL DATA:  Nausea and vomiting, decreased oral intake  EXAM: ACUTE ABDOMEN SERIES (ABDOMEN 2 VIEW & CHEST 1 VIEW)  COMPARISON:  08/22/2014  FINDINGS: The heart and pulmonary vascularity are within normal limits. Elevation of left hemidiaphragm is again seen. No acute bony abnormality is noted.  And scattered large and small bowel gas is seen. No free air is noted. No obstructive changes are seen. No abnormal mass or abnormal calcifications are noted. Old compression deformities are noted within the lumbar spine with  evidence of a degenerative scoliosis concave to the right in the lumbar spine.  IMPRESSION: L3 compression deformity consistent with the patient's given clinical history. No acute abnormality is noted in the chest and abdomen.   Electronically Signed   By: Inez Catalina M.D.   On: 12/08/2014 13:13    Scheduled Meds: . aspirin  81 mg Oral Q breakfast  . carbidopa-levodopa  0.5 tablet Oral Q24H  . carbidopa-levodopa  1 tablet Oral BH-qamhs  . cefTRIAXone (ROCEPHIN)  IV  1 g Intravenous Q24H  . [START ON 12/28/2014] cyanocobalamin  1,000 mcg Intramuscular Q30 days  . docusate sodium  100 mg Oral BID  . feeding supplement (ENSURE COMPLETE)  237 mL Oral BID BM  . fluticasone  2 spray Each Nare QHS  . hydrALAZINE  10 mg Oral BID  . magnesium oxide  400 mg Oral Daily  . pantoprazole  40 mg Oral Daily  . polyvinyl alcohol  1 drop Both Eyes BID  . potassium chloride  10 mEq Oral BID  . sodium chloride  1 g Oral TID WC   Continuous Infusions:   Active Problems:   Dehydration, moderate   Protein-calorie malnutrition, severe    Time spent: 40 minutes   [facility] Pager [phone]. If 7PM-7AM, please contact night-coverage at [email or web address], password [facility] 12/11/2014, 11:29 AM  LOS: 3 days

## 2014-12-11 NOTE — Progress Notes (Addendum)
Occupational Therapy Treatment Patient Details Name: Kimberly Silva MRN: [id number] DOB: 11-03-1926 Today's Date: 12/11/2014    History of present illness Pt was admitted for dehydration.  She has a h/o Parkinson's Disease and has recent L3 compression fx for which she received an injection  H/O fall approximately one month ago   OT comments  Pt fatiques easily, overall min A to min guard for adls.  She believes she is getting stronger  Follow Up Recommendations  SNF --Pt is only willing to consider [facility].  If she cannot get into this unit, she states she is willing to go to their ALF. Would recommend HHOT if she goes there   Equipment Recommendations  None recommended by OT    Recommendations for Other Services      Precautions / Restrictions Precautions Precautions: Fall Restrictions Weight Bearing Restrictions: No       Mobility Bed Mobility      sit to supine:  Modified independent            Transfers   Equipment used: Rolling walker (2 wheeled) Transfers: Sit to/from Stand Sit to Stand: Min guard;Min assist         General transfer comment: min A to control descent on comfort height commode    Balance                                   ADL       Grooming: Oral care;Wash/dry hands;Supervision/safety;Standing               Lower Body Dressing: Minimal assistance;Sit to/from stand;With adaptive equipment   Toilet Transfer: Minimal assistance;Ambulation;Comfort height toilet;Grab bars             General ADL Comments: ambulated to bathroom; used commode and stood for oral care and washing hands.  Educated on Secondary school teacher for ADLs when she was sitting back on EOB  Pt was shaky walking:  she states that she did not have her medication for Parkinson's yet      Vision                     Perception     Praxis      Cognition   Behavior During Therapy: WFL for tasks assessed/performed Overall Cognitive Status:  Within Functional Limits for tasks assessed                       Extremity/Trunk Assessment               Exercises     Shoulder Instructions       General Comments      Pertinent Vitals/ Pain       Pain Score: 2  Pain Location: back/RLE Pain Descriptors / Indicators: Sore Pain Intervention(s): Limited activity within patient's tolerance;Monitored during session;Repositioned (Pt did not feel she needed pain medication)  Home Living Family/patient expects to be discharged to:: Private residence Living Arrangements: Alone Available Help at Discharge: Available PRN/intermittently Type of Home: Independent living facility                                  Prior Functioning/Environment              Frequency Min 2X/week     Progress Toward Goals  OT Goals(current goals can  now be found in the care plan section)  Progress towards OT goals: Progressing toward goals     Plan      Co-evaluation                 End of Session     Activity Tolerance Patient limited by fatigue   Patient Left in bed;with call bell/phone within reach;with family/visitor present   Nurse Communication          Time:  - 858 -926 28 minutes    Charges: OT General Charges $OT Visit: 1 Procedure OT Treatments $Self Care/Home Management : 23-37 mins  Bilal Manzer 12/11/2014, 9:52 AM  Lesle Chris, OTR/L [phone] 12/11/2014

## 2014-12-11 NOTE — Clinical Social Work Psychosocial (Signed)
Clinical Social Work Department BRIEF PSYCHOSOCIAL ASSESSMENT 12/11/2014  Patient:  Kimberly Silva, Kimberly Silva     Account Number:  [id number]     [place] date:  12/08/2014  Clinical Social Worker:  Dede Query, CLINICAL SOCIAL WORKER  Date/Time:  12/11/2014 03:52 PM  Referred by:  CSW  Date Referred:  12/08/2014 Referred for  SNF Placement   Other Referral:   Interview type:  Patient Other interview type:   and daughter Kimberly Silva    PSYCHOSOCIAL DATA Living Status:  ALONE Admitted from facility:   Level of care:   Primary support name:  Margie Primary support relationship to patient:  CHILD, ADULT Degree of support available:   high    CURRENT CONCERNS  Other Concerns:    SOCIAL WORK ASSESSMENT / PLAN CSW met with pt and her daughter Kimberly Silva at bedside to discuss placement after discharge  CSW had reviewed PT evaluation yesterday which reflected [facility] and so a referral was made.  CSW reviewed PT progress not today which reflected SNF  CSW called PT to confirm SNF recommendation. Pt is currently residing at [place] and [facility].  Pt will also be open to [facility] bed [facility]   Assessment/plan status:  Psychosocial Support/Ongoing Assessment of Needs Other assessment/ plan:   Information/referral to community resources:    PATIENT'S/FAMILY'S RESPONSE TO PLAN OF CARE: Family stated that MD was confused with recommendation for SNF versus HH so CSW agreed to follow up with PT.  PT confirmed that they had changed from [facility] to SNF and CSW provided that info to pt's daughter via phone call.  Pt agreeable to SNF or ALF at [facility].  Will not go to any other SNF   .Dede Query, LCSW [facility] Clinical Social Worker - Weekend Coverage cell #: [phone]

## 2014-12-11 NOTE — Progress Notes (Signed)
Physical Therapy Treatment Patient Details Name: Kimberly Silva MRN: [id number] DOB: 12/31/1925 Today's Date: December 15, 2014    History of Present Illness Pt was admitted for dehydration.  She has a h/o Parkinson's Disease and has recent L3 compression fx for which she received an injection  H/O fall approximately one month ago    PT Comments    Progressing with activity tolerance.  Pt plans d.c to rehab setting at [facility] if bed available  Follow Up Recommendations  SNF (rehab setting at [facility])     Equipment Recommendations  None recommended by PT    Recommendations for Other Services OT consult     Precautions / Restrictions Precautions Precautions: Fall Restrictions Weight Bearing Restrictions: No    Mobility  Bed Mobility Overal bed mobility: Modified Independent                Transfers Overall transfer level: Needs assistance Equipment used: Rolling walker (2 wheeled) Transfers: Sit to/from Stand Sit to Stand: Min guard         General transfer comment: cues for use of UEs to self assist  Ambulation/Gait Ambulation/Gait assistance: Min guard Ambulation Distance (Feet): 450 Feet Assistive device: Rolling walker (2 wheeled) Gait Pattern/deviations: Step-through pattern;Decreased step length - right;Decreased step length - left;Shuffle;Trunk flexed Gait velocity: mod pace   General Gait Details: min cues for posture and position from RW - pt is used to 4 wheeled [employer] Rankin (Stroke Patients Only)       Balance                                    Cognition Arousal/Alertness: Awake/alert Behavior During Therapy: WFL for tasks assessed/performed Overall Cognitive Status: Within Functional Limits for tasks assessed                      Exercises      General Comments        Pertinent Vitals/Pain Pain Assessment: 0-10 Pain Score: 2  Pain  Location: R LE Pain Descriptors / Indicators: Aching Pain Intervention(s): Limited activity within patient's tolerance;Monitored during session    Home Living Family/patient expects to be discharged to:: Private residence Living Arrangements: Alone Available Help at Discharge: Available PRN/intermittently Type of Home: Independent living facility              Prior Function            PT Goals (current goals can now be found in the care plan section) Acute Rehab PT Goals Patient Stated Goal: get strength back PT Goal Formulation: With patient Time For Goal Achievement: 12/24/14 Potential to Achieve Goals: Good Progress towards PT goals: Progressing toward goals    Frequency  Min 3X/week    PT Plan Discharge plan needs to be updated    Co-evaluation             End of Session   Activity Tolerance: Patient tolerated treatment well Patient left: in bed;with call bell/phone within reach;with family/visitor present     Time: 4098-1191 PT Time Calculation (min) (ACUTE ONLY): 16 min  Charges:  $Gait Training: 8-22 mins                    G Codes:      Gagandeep Pettet Dec 15, 2014, 12:25  PM   

## 2014-12-11 NOTE — Clinical Social Work Note (Signed)
CSW had referred pt to case manager yesterday due to PT evaluation reflecting [facility]  CSW reviewed PT progress note today and it reflected SNF  CSW called and confirmed with PT who stated that they updated the progress note to reflect SNF  CSW met with pt again today and her daughter was presentLesleigh Silva).  Pt currently resides at [place] [facility] and wants to go to their SNF if they have a bed.  If there is no bed at [facility] then pt is willing to go to [facility] with home health services  CSW will continue to monitor  .Dede Query, LCSW [facility] Clinical Social Worker - Weekend Coverage cell #: [phone]

## 2014-12-12 LAB — BASIC METABOLIC PANEL
Anion gap: 1 — ABNORMAL LOW (ref 5–15)
BUN: 13 mg/dL (ref 6–23)
CO2: 25 mmol/L (ref 19–32)
CREATININE: 0.73 mg/dL (ref 0.50–1.10)
Calcium: 8.4 mg/dL (ref 8.4–10.5)
Chloride: 106 mEq/L (ref 96–112)
GFR calc non Af Amer: 75 mL/min — ABNORMAL LOW (ref >90)
GFR, EST AFRICAN AMERICAN: 87 mL/min — AB (ref >90)
Glucose, Bld: 98 mg/dL (ref 70–99)
Potassium: 3.5 mmol/L (ref 3.5–5.1)
Sodium: 132 mmol/L — ABNORMAL LOW (ref 135–145)

## 2014-12-12 LAB — CBC
HEMATOCRIT: 28.4 % — AB (ref 36.0–46.0)
HEMOGLOBIN: 9.2 g/dL — AB (ref 12.0–15.0)
MCH: 27.1 pg (ref 26.0–34.0)
MCHC: 32.4 g/dL (ref 30.0–36.0)
MCV: 83.8 fL (ref 78.0–100.0)
Platelets: 208 10*3/uL (ref 150–400)
RBC: 3.39 MIL/uL — ABNORMAL LOW (ref 3.87–5.11)
RDW: 13.7 % (ref 11.5–15.5)
WBC: 6.9 10*3/uL (ref 4.0–10.5)

## 2014-12-12 MED ORDER — ENSURE COMPLETE PO LIQD: 237.0000 mL | Freq: Two times a day (BID) | ORAL | Status: DC

## 2014-12-12 MED ORDER — HYDRALAZINE HCL 25 MG PO TABS
25.0000 mg | ORAL_TABLET | Freq: Three times a day (TID) | ORAL | Status: DC
  Administered 2014-12-12 (×2): 25 mg via ORAL
  Filled 2014-12-12 (×4): qty 1

## 2014-12-12 MED ORDER — CIPROFLOXACIN HCL 500 MG PO TABS
500.0000 mg | ORAL_TABLET | Freq: Two times a day (BID) | ORAL | Status: DC
  Administered 2014-12-12: 500 mg via ORAL
  Filled 2014-12-12 (×3): qty 1

## 2014-12-12 MED ORDER — TRAMADOL HCL 50 MG PO TABS: 50.0000 mg | ORAL_TABLET | Freq: Three times a day (TID) | ORAL | Status: DC | PRN

## 2014-12-12 MED ORDER — LORAZEPAM 0.5 MG PO TABS: 0.5000 mg | ORAL_TABLET | Freq: Two times a day (BID) | ORAL | Status: DC | PRN

## 2014-12-12 MED ORDER — TRAMADOL HCL 50 MG PO TABS
50.0000 mg | ORAL_TABLET | Freq: Three times a day (TID) | ORAL | Status: DC | PRN
  Administered 2014-12-12: 50 mg via ORAL
  Filled 2014-12-12: qty 1

## 2014-12-12 MED ORDER — ACETAMINOPHEN 325 MG PO TABS: 650.0000 mg | ORAL_TABLET | Freq: Four times a day (QID) | ORAL | Status: DC | PRN

## 2014-12-12 MED ORDER — CIPROFLOXACIN HCL 500 MG PO TABS: 500.0000 mg | ORAL_TABLET | Freq: Two times a day (BID) | ORAL | Status: AC

## 2014-12-12 MED ORDER — HYDRALAZINE HCL 25 MG PO TABS: 25.0000 mg | ORAL_TABLET | Freq: Three times a day (TID) | ORAL | Status: DC

## 2014-12-12 MED ORDER — SODIUM CHLORIDE 1 G PO TABS: 1.0000 g | ORAL_TABLET | Freq: Three times a day (TID) | ORAL | Status: DC

## 2014-12-12 MED ORDER — DSS 100 MG PO CAPS: 100.0000 mg | ORAL_CAPSULE | Freq: Two times a day (BID) | ORAL | Status: DC

## 2014-12-12 MED ORDER — HYDRALAZINE HCL 25 MG PO TABS: 25.0000 mg | ORAL_TABLET | Freq: Two times a day (BID) | ORAL | Status: DC

## 2014-12-12 NOTE — Progress Notes (Signed)
Physical Therapy Treatment Patient Details Name: Kimberly Silva MRN: [id number] DOB: 12/03/1926 Today's Date: 12-14-14    History of Present Illness Pt was admitted for dehydration.  She has a h/o Parkinson's Disease and has recent L3 compression fx for which she received an injection  H/O fall approximately one month ago    PT Comments    Pt reports feeling better today.  Pt plans on d/c to SNF hoping for placement at [facility].  Follow Up Recommendations  SNF (prefers rehab section at [facility])     Equipment Recommendations  None recommended by PT    Recommendations for Other Services       Precautions / Restrictions Precautions Precautions: Fall Restrictions Weight Bearing Restrictions: No    Mobility  Bed Mobility Overal bed mobility: Modified Independent                Transfers Overall transfer level: Needs assistance Equipment used: 4-wheeled walker Transfers: Sit to/from Stand Sit to Stand: Min guard         General transfer comment: cues for use of UEs to self assist, use of brakes with rollator  Ambulation/Gait Ambulation/Gait assistance: Min guard Ambulation Distance (Feet): 400 Feet Assistive device: Rolling walker (2 wheeled) Gait Pattern/deviations: Step-through pattern;Decreased stride length     General Gait Details: pt reports feeling better and steadier with rollator   Stairs            Wheelchair Mobility    Modified Rankin (Stroke Patients Only)       Balance                                    Cognition Arousal/Alertness: Awake/alert Behavior During Therapy: WFL for tasks assessed/performed Overall Cognitive Status: Within Functional Limits for tasks assessed                      Exercises      General Comments        Pertinent Vitals/Pain Pain Assessment: 0-10 Pain Score: 2  Pain Location: back Pain Descriptors / Indicators: Aching Pain Intervention(s): Monitored  during session;Repositioned    Home Living                      Prior Function            PT Goals (current goals can now be found in the care plan section) Progress towards PT goals: Progressing toward goals    Frequency  Min 3X/week    PT Plan Current plan remains appropriate    Co-evaluation             End of Session   Activity Tolerance: Patient tolerated treatment well Patient left: in bed;with call bell/phone within reach;with family/visitor present     Time: 6270-3500 PT Time Calculation (min) (ACUTE ONLY): 15 min  Charges:  $Gait Training: 8-22 mins                    G Codes:      Vaniyah Lansky,KATHrine E 14-Dec-2014, 12:21 PM Carmelia Bake, PT, DPT 14-Dec-2014 Pager: [phone]

## 2014-12-12 NOTE — Discharge Summary (Addendum)
Physician Discharge Summary  Kimberly Silva MRN: [id number] DOB/AGE: [birth year] 80 y.o.  PCP: Lottie Dawson, MD   Admit date: 12/08/2014 Discharge date: 12/12/2014  Discharge Diagnoses:  Urinary tract infection   Dehydration, moderate   Protein-calorie malnutrition, severe Hyponatremia, improving Hypertension, benign, uncontrolled L3 compression fracture Parkinson disease    Follow-up recommendations Follow-up CBC, CMP in 1 week Regular diet without salt restriction given hyponatremia, fluid restriction to 1500 mL a day Follow-up with PCP in 5-7 days      Medication List    STOP taking these medications        HYDROcodone-acetaminophen 5-325 MG per tablet  Commonly known as:  NORCO/VICODIN     lisinopril 10 MG tablet  Commonly known as:  PRINIVIL,ZESTRIL     triamterene-hydrochlorothiazide 37.5-25 MG per tablet  Commonly known as:  MAXZIDE-25      TAKE these medications        acetaminophen 325 MG tablet  Commonly known as:  TYLENOL  Take 2 tablets (650 mg total) by mouth every 6 (six) hours as needed for mild pain (or Fever >/= 101).     aspirin 81 MG tablet  Take 81 mg by mouth daily with breakfast.     carbidopa-levodopa 25-100 MG per tablet  Commonly known as:  SINEMET IR  Take 0.5-1 tablets by mouth 3 (three) times daily. Take 1 tablet in the morning, 1/2 tablet at 1600, 1 tablet at bedtime     ciprofloxacin 500 MG tablet  Commonly known as:  CIPRO  Take 1 tablet (500 mg total) by mouth 2 (two) times daily.     cyanocobalamin 1000 MCG/ML injection  Commonly known as:  (VITAMIN B-12)  Inject 1 mL (1,000 mcg total) into the muscle every 30 (thirty) days.     DSS 100 MG Caps  Take 100 mg by mouth 2 (two) times daily.     feeding supplement (ENSURE COMPLETE) Liqd  Take 237 mLs by mouth 2 (two) times daily between meals.     fluticasone 50 MCG/ACT nasal spray  Commonly known as:  FLONASE  Place 2 sprays into both nostrils at  bedtime.     hydrALAZINE 25 MG tablet  Commonly known as:  APRESOLINE  Take 1 tablet (25 mg total) by mouth every 8 (eight) hours.     LORazepam 0.5 MG tablet  Commonly known as:  ATIVAN  Take 1 tablet (0.5 mg total) by mouth 2 (two) times daily as needed for anxiety.     Magnesium 250 MG Tabs  Take 250 mg by mouth daily with breakfast.     ondansetron 4 MG tablet  Commonly known as:  ZOFRAN  Take 4 mg by mouth every 8 (eight) hours as needed for nausea or vomiting.     pantoprazole 40 MG tablet  Commonly known as:  PROTONIX  Take 40 mg by mouth daily.     polyethylene glycol packet  Commonly known as:  MIRALAX / GLYCOLAX  Take 17 g by mouth daily as needed for mild constipation.     potassium chloride 10 MEQ tablet  Commonly known as:  K-DUR  Take 10 mEq by mouth 2 (two) times daily.     PRESCRIPTION MEDICATION  Steroid injection in lumbar area - at dr's office     sertraline 25 MG tablet  Commonly known as:  ZOLOFT  Take 1 tablet (25 mg total) by mouth daily.     SYSTANE 0.4-0.3 % Soln  Generic drug:  Polyethyl Glycol-Propyl Glycol  Apply 1 drop to eye 2 (two) times daily.     traMADol 50 MG tablet  Commonly known as:  ULTRAM  Take 1 tablet (50 mg total) by mouth every 8 (eight) hours as needed for moderate pain.     Vitamin D3 1000 UNITS Caps  Take 1,000 Units by mouth daily with breakfast.        Discharge Condition: Stable  Disposition: 04-Intermediate Care Facility   Consults: None  Significant Diagnostic Studies: Dg Chest 2 View  12/09/2014   CLINICAL DATA:  Cough and hypertension  EXAM: CHEST  2 VIEW  COMPARISON:  12/08/2014  FINDINGS: Cardiac shadow is within normal limits. Elevation of the left hemidiaphragm is again seen. Lungs are well aerated bilaterally without focal infiltrate or sizable effusion. Mild degenerative changes of the thoracic spine are noted. A mid thoracic compression deformity is noted better visualized on the current exam.   IMPRESSION: Thoracic compression deformity of uncertain age. No other focal abnormality is seen.   Electronically Signed   By: Inez Catalina M.D.   On: 12/09/2014 10:17   Dg Abd Acute W/chest  12/08/2014   CLINICAL DATA:  Nausea and vomiting, decreased oral intake  EXAM: ACUTE ABDOMEN SERIES (ABDOMEN 2 VIEW & CHEST 1 VIEW)  COMPARISON:  08/22/2014  FINDINGS: The heart and pulmonary vascularity are within normal limits. Elevation of left hemidiaphragm is again seen. No acute bony abnormality is noted.  And scattered large and small bowel gas is seen. No free air is noted. No obstructive changes are seen. No abnormal mass or abnormal calcifications are noted. Old compression deformities are noted within the lumbar spine with evidence of a degenerative scoliosis concave to the right in the lumbar spine.  IMPRESSION: L3 compression deformity consistent with the patient's given clinical history. No acute abnormality is noted in the chest and abdomen.   Electronically Signed   By: Inez Catalina M.D.   On: 12/08/2014 13:13      Microbiology: Recent Results (from the past 240 hour(s))  Urine culture     Status: None   Collection Time: 12/08/14 11:22 AM  Result Value Ref Range Status   Specimen Description URINE, CLEAN CATCH  Final   Special Requests NONE  Final   Colony Count   Final    75,000 COLONIES/ML Performed at [facility]    Culture   Final    Multiple bacterial morphotypes present, none predominant. Suggest appropriate recollection if clinically indicated. Performed at [facility]    Report Status 12/10/2014 FINAL  Final  Urine culture     Status: None   Collection Time: 12/08/14  1:50 PM  Result Value Ref Range Status   Specimen Description URINE, RANDOM  Final   Special Requests NONE  Final   Colony Count   Final    25,000 COLONIES/ML Performed at [facility]    Culture   Final    Multiple bacterial morphotypes present, none predominant. Suggest  appropriate recollection if clinically indicated. Performed at [facility]    Report Status 12/10/2014 FINAL  Final     Labs: Results for orders placed or performed during the hospital encounter of 12/08/14 (from the past 48 hour(s))  Basic metabolic panel     Status: Abnormal   Collection Time: 12/11/14  5:05 AM  Result Value Ref Range   Sodium 134 (L) 135 - 145 mmol/L    Comment: Please note change in reference range.  Potassium 3.5 3.5 - 5.1 mmol/L    Comment: Please note change in reference range.   Chloride 106 96 - 112 mEq/L   CO2 24 19 - 32 mmol/L   Glucose, Bld 92 70 - 99 mg/dL   BUN 14 6 - 23 mg/dL   Creatinine, Ser 0.80 0.50 - 1.10 mg/dL   Calcium 8.7 8.4 - 10.5 mg/dL   GFR calc non Af Amer 65 (L) >90 mL/min   GFR calc Af Amer 75 (L) >90 mL/min    Comment: (NOTE) The eGFR has been calculated using the CKD EPI equation. This calculation has not been validated in all clinical situations. eGFR's persistently <90 mL/min signify possible Chronic Kidney Disease.    Anion gap 4 (L) 5 - 15  CBC     Status: Abnormal   Collection Time: 12/11/14  5:05 AM  Result Value Ref Range   WBC 6.9 4.0 - 10.5 K/uL   RBC 3.62 (L) 3.87 - 5.11 MIL/uL   Hemoglobin 9.9 (L) 12.0 - 15.0 g/dL   HCT 30.4 (L) 36.0 - 46.0 %   MCV 84.0 78.0 - 100.0 fL   MCH 27.3 26.0 - 34.0 pg   MCHC 32.6 30.0 - 36.0 g/dL   RDW 13.6 11.5 - 15.5 %   Platelets 226 150 - 400 K/uL  Urinalysis, Routine w reflex microscopic     Status: Abnormal   Collection Time: 12/11/14  5:23 PM  Result Value Ref Range   Color, Urine YELLOW YELLOW   APPearance CLEAR CLEAR   Specific Gravity, Urine 1.023 1.005 - 1.030   pH 5.5 5.0 - 8.0   Glucose, UA NEGATIVE NEGATIVE mg/dL   Hgb urine dipstick NEGATIVE NEGATIVE   Bilirubin Urine NEGATIVE NEGATIVE   Ketones, ur NEGATIVE NEGATIVE mg/dL   Protein, ur NEGATIVE NEGATIVE mg/dL   Urobilinogen, UA 0.2 0.0 - 1.0 mg/dL   Nitrite NEGATIVE NEGATIVE   Leukocytes, UA SMALL  (A) NEGATIVE  Urine microscopic-add on     Status: None   Collection Time: 12/11/14  5:23 PM  Result Value Ref Range   Squamous Epithelial / LPF RARE RARE   WBC, UA 11-20 <3 WBC/hpf   RBC / HPF 0-2 <3 RBC/hpf   Urine-Other MUCOUS PRESENT   Basic metabolic panel     Status: Abnormal   Collection Time: 12/12/14  4:53 AM  Result Value Ref Range   Sodium 132 (L) 135 - 145 mmol/L    Comment: Please note change in reference range. REPEATED TO VERIFY    Potassium 3.5 3.5 - 5.1 mmol/L    Comment: Please note change in reference range. REPEATED TO VERIFY    Chloride 106 96 - 112 mEq/L    Comment: REPEATED TO VERIFY   CO2 25 19 - 32 mmol/L    Comment: REPEATED TO VERIFY   Glucose, Bld 98 70 - 99 mg/dL   BUN 13 6 - 23 mg/dL   Creatinine, Ser 0.73 0.50 - 1.10 mg/dL   Calcium 8.4 8.4 - 10.5 mg/dL    Comment: REPEATED TO VERIFY   GFR calc non Af Amer 75 (L) >90 mL/min   GFR calc Af Amer 87 (L) >90 mL/min    Comment: (NOTE) The eGFR has been calculated using the CKD EPI equation. This calculation has not been validated in all clinical situations. eGFR's persistently <90 mL/min signify possible Chronic Kidney Disease.    Anion gap 1 (L) 5 - 15    Comment: REPEATED TO VERIFY  CBC  Status: Abnormal   Collection Time: 12/12/14  4:53 AM  Result Value Ref Range   WBC 6.9 4.0 - 10.5 K/uL   RBC 3.39 (L) 3.87 - 5.11 MIL/uL   Hemoglobin 9.2 (L) 12.0 - 15.0 g/dL   HCT 28.4 (L) 36.0 - 46.0 %   MCV 83.8 78.0 - 100.0 fL   MCH 27.1 26.0 - 34.0 pg   MCHC 32.4 30.0 - 36.0 g/dL   RDW 13.7 11.5 - 15.5 %   Platelets 208 150 - 400 K/uL     Kimberly Silva :80 year old female with a history of hypertension, lumbar spinal stenosis, living in [facility], presents with nausea and weakness for the last 1 month. Patient had a fall one month ago for which he has been taking hydrocodone. She has not been constipated and usually takes miralax for constipation however she has had nausea and poor oral  intake for which she has been taking Zofran with no relief. She denies any fever chills, denies any shortness of breath or chest pain. She was admitted in September for bradycardia secondary to AV nodal blocking medications. Her Coreg was discontinued. This has not been an issue since. Patient was recently told that she has a L3 compression fracture last week. rx with epidural steroid injection last week  HOSPITAL COURSE:  UTI Initially treated with Rocephin, repeat UA, shows persistent UTI Urine culture shows greater than 25,000 colonies, no sensitivity reported Continue with ciprofloxacin for another 3 days then discontinue  Hyponatremia-improving Could be secondary to poor oral intake and dehydration but also secondary to maxide  Discontinued maxzide , discontinue IV fluids TSH within normal limits Serum osmolality, urine osmolality 476, urine sodium 123 Continue sodium chloride tablets for one month, fluid restriction 1500 mL per day Monitor BMP weekly    L3 compression fracture Patient received epidural steroid injection last week for this Patient has been told that she is not a candidate for surgery for lumbar spinal stenosis Does not like Vicodin, recommend continuing tramadol and Tylenol for pain control PT/OT eval recommended SNF  Hypertension Will continue hydralazine, uptitrate as needed, increase to 25 mg 3 times a day Recommended patient to uptitrate hydralazine for uncontrolled blood pressure, patient should be able to discontinue her other antihypertensive medications including Maxzide and lisinopril   Parkinson's disease Continue with Sinemet     Discharge Exam: *   Blood pressure 133/49, pulse 67, temperature 99.4 F (37.4 C), temperature source Oral, resp. rate 16, height _0  (1.575 m), weight 51.529 kg (113 lb 9.6 oz), SpO2 100 %.    General: alert & oriented x 3 In NAD   Cardiovascular: RRR, nl S1 s2   Respiratory: Decreased breath sounds at  the bases, scattered rhonchi, no crackles   Abdomen: soft +BS NT/ND, no masses palpable   Extremities: No cyanosis and no edema        Discharge Instructions    Diet - low sodium heart healthy    Complete by:  As directed      Increase activity slowly    Complete by:  As directed              Signed: Loretta Kluender 12/12/2014, 12:12 PM

## 2014-12-12 NOTE — Progress Notes (Signed)
Pt was D/C to assisted living at [facility] in stable condition. Her daughter transported her to the facility. I gave report to Kaylyn Lim.

## 2014-12-12 NOTE — Progress Notes (Signed)
Patient is set to discharge back to [facility] today. Patient & daughter, Lesleigh Noe aware. Discharge packet given to RN, Catie. Family to transport back to facility.     Raynaldo Opitz, [facility] Clinical Social Worker cell #: [phone]

## 2014-12-13 ENCOUNTER — Telehealth: Payer: Self-pay | Admitting: Family Medicine

## 2014-12-13 NOTE — Telephone Encounter (Signed)
Cal family and patient Has Routine appt for dec 31     Advise change appt to a FU hospital appt To next week    30 minutes     Thanks    [facility]

## 2014-12-13 NOTE — Telephone Encounter (Signed)
Pt is last pt on morning. Can we change type to hospital fup. Dr Regis Bill does not have any 30 min appt next week.

## 2014-12-14 ENCOUNTER — Non-Acute Institutional Stay (SKILLED_NURSING_FACILITY): Payer: Medicare Other | Admitting: Nurse Practitioner

## 2014-12-14 ENCOUNTER — Encounter: Payer: Self-pay | Admitting: Nurse Practitioner

## 2014-12-14 DIAGNOSIS — I10 Essential (primary) hypertension: Secondary | ICD-10-CM

## 2014-12-14 DIAGNOSIS — S32030A Wedge compression fracture of third lumbar vertebra, initial encounter for closed fracture: Secondary | ICD-10-CM | POA: Insufficient documentation

## 2014-12-14 DIAGNOSIS — F4322 Adjustment disorder with anxiety: Secondary | ICD-10-CM

## 2014-12-14 DIAGNOSIS — S32030S Wedge compression fracture of third lumbar vertebra, sequela: Secondary | ICD-10-CM

## 2014-12-14 DIAGNOSIS — E871 Hypo-osmolality and hyponatremia: Secondary | ICD-10-CM

## 2014-12-14 DIAGNOSIS — D518 Other vitamin B12 deficiency anemias: Secondary | ICD-10-CM

## 2014-12-14 DIAGNOSIS — G20 Parkinson's disease: Secondary | ICD-10-CM

## 2014-12-14 DIAGNOSIS — N390 Urinary tract infection, site not specified: Secondary | ICD-10-CM

## 2014-12-14 DIAGNOSIS — E876 Hypokalemia: Secondary | ICD-10-CM

## 2014-12-14 DIAGNOSIS — K5900 Constipation, unspecified: Secondary | ICD-10-CM

## 2014-12-14 DIAGNOSIS — K219 Gastro-esophageal reflux disease without esophagitis: Secondary | ICD-10-CM | POA: Insufficient documentation

## 2014-12-14 HISTORY — DX: Wedge compression fracture of third lumbar vertebra, initial encounter for closed fracture: S32.030A

## 2014-12-14 NOTE — Assessment & Plan Note (Signed)
Could be secondary to poor oral intake and dehydration but also secondary to maxide   Discontinued maxzide , discontinue IV fluids TSH within normal limits Serum osmolality, urine osmolality 476, urine sodium 123 Hold sodium chloride tablets, fluid restriction 1500 mL per day Monitor CMP and TSH

## 2014-12-14 NOTE — Assessment & Plan Note (Signed)
Initially treated with Rocephin, repeat UA, shows persistent UTI Urine culture shows greater than 25,000 colonies, no sensitivity reported Continue with ciprofloxacin for another 3 days then discontinue

## 2014-12-14 NOTE — Assessment & Plan Note (Signed)
Patient received epidural steroid injection last week for this Patient has been told that she is not a candidate for surgery for lumbar spinal stenosis Does not like Vicodin, recommend continuing tramadol and Tylenol for pain control PT/OT eval recommended SNF

## 2014-12-14 NOTE — Assessment & Plan Note (Signed)
Continue PPI-observe.

## 2014-12-14 NOTE — Assessment & Plan Note (Signed)
Continue Sinemet ?

## 2014-12-14 NOTE — Telephone Encounter (Signed)
Pt daughter in law Lesleigh Noe is calling back to cancelled the appt for tomorrow and pt must see md at facility due to medicare. They will call back once pt is discharge from facility

## 2014-12-14 NOTE — Telephone Encounter (Signed)
Daughter Lesleigh Noe hodgin is wanting to know if she and her sister can come in the place of pt  Tomorrow?  Pt had a fup tomoorow at 11 am. However, pt has been in the hospital so now this should be a hospital fup. She states pt does not feel well enough to come, but will need a hospital fup next week.   Pls advise on both issues.Marland Kitchentommorrow's appt for daughter and where to put a hospital fup for pt next week. Thanks!

## 2014-12-14 NOTE — Assessment & Plan Note (Addendum)
Continue Sertraline 25mg  daily. Desires to resume home dose Ativan 0.5mg  bid 9am and 8pm.  Observe.

## 2014-12-14 NOTE — Assessment & Plan Note (Signed)
Continue Colace bid and MiraLax prn.

## 2014-12-14 NOTE — Telephone Encounter (Signed)
11 30 friday 8th or 345 and 4 pm on Monday .

## 2014-12-14 NOTE — Assessment & Plan Note (Signed)
continue hydralazine,  uptitrate as needed, increase to 25 mg 3 times a day Recommended patient to uptitrate hydralazine for uncontrolled blood pressure, patient should be able to discontinue her other antihypertensive medications including Maxzide and lisinopril

## 2014-12-14 NOTE — Assessment & Plan Note (Signed)
Continue B12. Update CBC

## 2014-12-14 NOTE — Progress Notes (Signed)
Patient ID: Kimberly Silva, female   DOB: 04-May-1926, 80 y.o.   MRN: [id number]   Code Status: DNR  No Known Allergies  Chief Complaint  Patient presents with  . Medical Management of Chronic Issues  . Acute Visit    back pain.     HPI: Patient is a 80 y.o. female seen in the SNF at [facility] today for evaluation of  Hospitalization for back pain and other chronic medical conditions.     Hospitalized 12/08/2014-12/12/2014 for Urinary tract infection Dehydration, moderate Protein-calorie malnutrition, severe Hyponatremia, improving Hypertension, benign, uncontrolled, L3 compression fracture Parkinson disease   Presentation to NO:[facility] and weakness for the last 1 month. Patient had a fall one month ago for which he has been taking hydrocodone. She was admitted in September for bradycardia secondary to AV nodal blocking medications. Her Coreg was discontinued. This has  not been an  issue since. Patient was recently told that she has a  L3 compression fracture last week. rx with epidural steroid injection last week  Parkinson's disease Continue with Sinemet    Problem List Items Addressed This Visit    UTI (urinary tract infection) - Primary     Initially treated with Rocephin, repeat UA, shows persistent UTI Urine culture shows greater than 25,000 colonies, no sensitivity reported Continue with ciprofloxacin for another 3 days then discontinue     Parkinsonian tremor    Continue Sinemet    Hyponatremia   HYPOKALEMIA    Could be secondary to poor oral intake and dehydration but also secondary to maxide   Discontinued maxzide , discontinue IV fluids TSH within normal limits Serum osmolality, urine osmolality 476, urine sodium 123 Hold sodium chloride tablets, fluid restriction 1500 mL per day Monitor CMP and TSH     GERD (gastroesophageal reflux disease)    Continue PPI-observe.     Essential hypertension    continue hydralazine,  uptitrate as needed, increase  to 25 mg 3 times a day Recommended patient to uptitrate hydralazine for uncontrolled blood pressure, patient should be able to discontinue her other antihypertensive medications including Maxzide and lisinopril     Constipation    Continue Colace bid and MiraLax prn.     Compression fracture of L3 lumbar vertebra    Patient received epidural steroid injection last week for this Patient has been told that she is not a candidate for surgery for lumbar spinal stenosis Does not like Vicodin, recommend continuing tramadol and Tylenol for pain control PT/OT eval recommended SNF     ANEMIA, B12 DEFICIENCY    Continue B12. Update CBC    Adjustment disorder with anxious mood    Continue Sertraline 25mg  daily. Desires to resume home dose Ativan 0.5mg  bid 9am and 8pm.  Observe.        Review of Systems:  Review of Systems  Constitutional: Positive for malaise/fatigue. Negative for fever, chills, weight loss and diaphoresis.  HENT: Positive for hearing loss. Negative for congestion, ear discharge, ear pain, nosebleeds, sore throat and tinnitus.   Eyes: Negative for blurred vision, double vision, photophobia, pain, discharge and redness.  Respiratory: Negative for cough, hemoptysis, sputum production, shortness of breath, wheezing and stridor.   Cardiovascular: Positive for leg swelling. Negative for chest pain, palpitations, orthopnea, claudication and PND.       Trace BLE on and off  Gastrointestinal: Positive for vomiting and abdominal pain. Negative for heartburn, nausea, diarrhea, constipation, blood in stool and melena.       Nauseated  sometimes.   Genitourinary: Negative for dysuria, urgency, frequency, hematuria and flank pain.  Musculoskeletal: Positive for back pain. Negative for joint pain, falls and neck pain.       Lower back pain radiates to the right thigh and knee.   Skin: Negative for rash.  Neurological: Positive for tremors. Negative for dizziness, tingling, sensory change,  speech change, focal weakness, seizures, loss of consciousness, weakness and headaches.  Endo/Heme/Allergies: Negative for environmental allergies and polydipsia. Does not bruise/bleed easily.  Psychiatric/Behavioral: Negative for depression, suicidal ideas, hallucinations, memory loss and substance abuse. The patient is nervous/anxious and has insomnia.      Past Medical History  Diagnosis Date  . HYPERLIPIDEMIA 02/21/2009  . HYPOKALEMIA 08/17/2007  . ANEMIA, B12 DEFICIENCY 03/13/2009  . HYPERTENSION 05/14/2007  . Blood in stool 01/25/2008  . OSTEOARTHRITIS 05/14/2007  . OSTEOPENIA 05/14/2007    2007 hx of fosamax use   . TREMOR 07/08/2007  . FREQUENCY, URINARY 08/19/2008  . LUMBAR STRAIN 09/21/2007  . Gastric polyp     panendo  5 2009  . History of ankle fracture   . Parkinson's disease   . Adenomatous colon polyp   . Lumbar compression fracture    Past Surgical History  Procedure Laterality Date  . Abdominal hysterectomy    . Fracture  2007    left ankle- Hx of fosamax use  . Dilation and curettage of uterus    . Cataract extraction, bilateral    . Colonoscopy N/A 07/19/2013    Procedure: COLONOSCOPY;  Surgeon: Lafayette Dragon, MD;  Location: [facility] ENDOSCOPY;  Service: Endoscopy;  Laterality: N/A;   Social History:   reports that she has never smoked. She has never used smokeless tobacco. She reports that she does not drink alcohol or use illicit drugs.  Family History  Problem Relation Age of Onset  . Heart attack Mother   . Coronary artery disease Mother   . Colon cancer Sister     x 2  . Breast cancer Sister     x 4  . Parkinsonism Brother   . Peripheral vascular disease      sibling  . Stroke Father   . Colon cancer Other     nephew  . Colon cancer Paternal Aunt   . Breast cancer Sister   . Breast cancer Sister     Medications: Patient's Medications  New Prescriptions   No medications on file  Previous Medications   ACETAMINOPHEN (TYLENOL) 325 MG TABLET    Take 2  tablets (650 mg total) by mouth every 6 (six) hours as needed for mild pain (or Fever >/= 101).   ASPIRIN 81 MG TABLET    Take 81 mg by mouth daily with breakfast.    CARBIDOPA-LEVODOPA (SINEMET IR) 25-100 MG PER TABLET    Take 0.5-1 tablets by mouth 3 (three) times daily. Take 1 tablet in the morning, 1/2 tablet at 1600, 1 tablet at bedtime   CHOLECALCIFEROL (VITAMIN D3) 1000 UNITS CAPS    Take 1,000 Units by mouth daily with breakfast.    CIPROFLOXACIN (CIPRO) 500 MG TABLET    Take 1 tablet (500 mg total) by mouth 2 (two) times daily.   CYANOCOBALAMIN (,VITAMIN B-12,) 1000 MCG/ML INJECTION    Inject 1 mL (1,000 mcg total) into the muscle every 30 (thirty) days.   DOCUSATE SODIUM 100 MG CAPS    Take 100 mg by mouth 2 (two) times daily.   FEEDING SUPPLEMENT, ENSURE COMPLETE, (ENSURE COMPLETE) LIQD  Take 237 mLs by mouth 2 (two) times daily between meals.   FLUTICASONE (FLONASE) 50 MCG/ACT NASAL SPRAY    Place 2 sprays into both nostrils at bedtime.   HYDRALAZINE (APRESOLINE) 25 MG TABLET    Take 1 tablet (25 mg total) by mouth every 8 (eight) hours.   LORAZEPAM (ATIVAN) 0.5 MG TABLET    Take 1 tablet (0.5 mg total) by mouth 2 (two) times daily as needed for anxiety.   MAGNESIUM 250 MG TABS    Take 250 mg by mouth daily with breakfast.    ONDANSETRON (ZOFRAN) 4 MG TABLET    Take 4 mg by mouth every 8 (eight) hours as needed for nausea or vomiting.   PANTOPRAZOLE (PROTONIX) 40 MG TABLET    Take 40 mg by mouth daily.   POLYETHYL GLYCOL-PROPYL GLYCOL (SYSTANE) 0.4-0.3 % SOLN    Apply 1 drop to eye 2 (two) times daily.    POLYETHYLENE GLYCOL (MIRALAX / GLYCOLAX) PACKET    Take 17 g by mouth daily as needed for mild constipation.    POTASSIUM CHLORIDE (K-DUR) 10 MEQ TABLET    Take 10 mEq by mouth 2 (two) times daily.    PRESCRIPTION MEDICATION    Steroid injection in lumbar area - at dr's office   SERTRALINE (ZOLOFT) 25 MG TABLET    Take 1 tablet (25 mg total) by mouth daily.   SODIUM CHLORIDE 1 G  TABLET    Take 1 tablet (1 g total) by mouth 3 (three) times daily with meals.   TRAMADOL (ULTRAM) 50 MG TABLET    Take 1 tablet (50 mg total) by mouth every 8 (eight) hours as needed for moderate pain.  Modified Medications   No medications on file  Discontinued Medications   No medications on file     Physical Exam: Physical Exam  Constitutional: She is oriented to person, place, and time. She appears well-developed and well-nourished. No distress.  HENT:  Head: Normocephalic and atraumatic.  Right Ear: External ear normal.  Left Ear: External ear normal.  Nose: Nose normal.  Mouth/Throat: Oropharynx is clear and moist. No oropharyngeal exudate.  Eyes: Conjunctivae and EOM are normal. Pupils are equal, round, and reactive to light. Right eye exhibits no discharge. Left eye exhibits no discharge. No scleral icterus.  Neck: Normal range of motion. Neck supple. No JVD present. No tracheal deviation present. No thyromegaly present.  Cardiovascular: Normal rate, regular rhythm, normal heart sounds and intact distal pulses.  Exam reveals no friction rub.   No murmur heard. Pulmonary/Chest: Effort normal and breath sounds normal. No stridor. No respiratory distress. She has no wheezes. She has no rales. She exhibits no tenderness.  Abdominal: Soft. Bowel sounds are normal. She exhibits no distension and no mass. There is no tenderness. There is no rebound and no guarding.  Musculoskeletal: Normal range of motion. She exhibits tenderness. She exhibits no edema.  Lower back pain refers to the right thigh and knee.   Lymphadenopathy:    She has no cervical adenopathy.  Neurological: She is alert and oriented to person, place, and time. She has normal reflexes. She displays normal reflexes. No cranial nerve deficit. She exhibits normal muscle tone. Coordination normal.  Skin: Skin is warm and dry. No rash noted. She is not diaphoretic. No erythema. No pallor.  Psychiatric: She has a normal mood  and affect. Her behavior is normal. Judgment and thought content normal.  Anxious.     Filed Vitals:   12/14/14 1046  BP: 140/86  Pulse: 79  Temp: 97.8 F (36.6 C)  TempSrc: Tympanic  Resp: 18      Labs reviewed: Basic Metabolic Panel:  Recent Labs  01/05/14 1152  08/22/14 2137  11/08/14 1149  12/08/14 1421  12/10/14 0438 12/11/14 0505 12/12/14 0453  NA 137  < >  --   < > 136  < >  --   < > 132* 134* 132*  K 3.7  < >  --   < > 3.7  < >  --   < > 3.8 3.5 3.5  CL 101  < >  --   < > 99  < >  --   < > 104 106 106  CO2 30  < >  --   < > 25  < >  --   < > 23 24 25   GLUCOSE 81  < >  --   < > 103*  < >  --   < > 98 92 98  BUN 20  < >  --   < > 19  < >  --   < > 12 14 13   CREATININE 1.1  < >  --   < > 1.2  < >  --   < > 0.90 0.80 0.73  CALCIUM 9.2  < >  --   < > 9.3  < >  --   < > 8.5 8.7 8.4  MG 1.9  < > 1.7  --  1.8  --  1.7  --   --   --   --   TSH 0.81  --   --   --   --   --  0.767  --   --   --   --   < > = values in this interval not displayed. Liver Function Tests:  Recent Labs  03/24/14 1027 12/08/14 1036 12/09/14 0158  AST 12 11 9   ALT 3 <5 <5  ALKPHOS 41 59 51  BILITOT 0.7 0.5 0.3  PROT 6.9 6.6 5.5*  ALBUMIN 3.9 3.8 3.1*    Recent Labs  12/08/14 1036  LIPASE 27   No results for input(s): AMMONIA in the last 8760 hours. CBC:  Recent Labs  03/24/14 1027 07/19/14 1438  12/08/14 1036 12/09/14 0158 12/11/14 0505 12/12/14 0453  WBC 8.8 7.6  < > 7.0 6.7 6.9 6.9  NEUTROABS 7.0 5.8  --  5.5  --   --   --   HGB 13.4 12.3  < > 10.7* 9.3* 9.9* 9.2*  HCT 40.2 38.0  < > 32.5* 28.1* 30.4* 28.4*  MCV 86.1 86.0  < > 82.9 83.6 84.0 83.8  PLT 257.0 205  < > 269 233 226 208  < > = values in this interval not displayed. Lipid Panel: No results for input(s): CHOL, HDL, LDLCALC, TRIG, CHOLHDL, LDLDIRECT in the last 8760 hours.  Past Procedures:     Assessment/Plan UTI (urinary tract infection)  Initially treated with Rocephin, repeat UA, shows  persistent UTI Urine culture shows greater than 25,000 colonies, no sensitivity reported Continue with ciprofloxacin for another 3 days then discontinue   HYPOKALEMIA Could be secondary to poor oral intake and dehydration but also secondary to maxide   Discontinued maxzide , discontinue IV fluids TSH within normal limits Serum osmolality, urine osmolality 476, urine sodium 123 Hold sodium chloride tablets, fluid restriction 1500 mL per day Monitor CMP and TSH   Compression fracture of L3 lumbar  vertebra Patient received epidural steroid injection last week for this Patient has been told that she is not a candidate for surgery for lumbar spinal stenosis Does not like Vicodin, recommend continuing tramadol and Tylenol for pain control PT/OT eval recommended SNF   Essential hypertension continue hydralazine,  uptitrate as needed, increase to 25 mg 3 times a day Recommended patient to uptitrate hydralazine for uncontrolled blood pressure, patient should be able to discontinue her other antihypertensive medications including Maxzide and lisinopril   Parkinsonian tremor Continue Sinemet  ANEMIA, B12 DEFICIENCY Continue B12. Update CBC  Constipation Continue Colace bid and MiraLax prn.   Adjustment disorder with anxious mood Continue Sertraline 25mg  daily. Desires to resume home dose Ativan 0.5mg  bid 9am and 8pm.  Observe.   GERD (gastroesophageal reflux disease) Continue PPI-observe.     Family/ Staff Communication: observe the patient  Goals of Care: [place]  Labs/tests ordered: CBC, CMP, TSH

## 2014-12-15 ENCOUNTER — Ambulatory Visit: Payer: Medicare Other | Admitting: Internal Medicine

## 2014-12-15 LAB — BASIC METABOLIC PANEL
BUN: 13 mg/dL (ref 4–21)
Creatinine: 1 mg/dL (ref 0.5–1.1)
Glucose: 82 mg/dL
Potassium: 4 mmol/L (ref 3.4–5.3)
SODIUM: 134 mmol/L — AB (ref 137–147)

## 2014-12-15 LAB — HEPATIC FUNCTION PANEL
ALT: 8 U/L (ref 7–35)
AST: 8 U/L — AB (ref 13–35)
Alkaline Phosphatase: 44 U/L (ref 25–125)
Bilirubin, Total: 0.5 mg/dL

## 2014-12-15 LAB — CBC AND DIFFERENTIAL
HCT: 29 % — AB (ref 36–46)
HEMOGLOBIN: 9.5 g/dL — AB (ref 12.0–16.0)
Platelets: 230 10*3/uL (ref 150–399)
WBC: 6.3 10^3/mL

## 2014-12-15 LAB — TSH: TSH: 1.31 u[IU]/mL (ref 0.41–5.90)

## 2014-12-19 ENCOUNTER — Other Ambulatory Visit: Payer: Self-pay | Admitting: Nurse Practitioner

## 2014-12-19 DIAGNOSIS — E871 Hypo-osmolality and hyponatremia: Secondary | ICD-10-CM

## 2014-12-21 ENCOUNTER — Encounter: Payer: Self-pay | Admitting: Nurse Practitioner

## 2014-12-21 ENCOUNTER — Non-Acute Institutional Stay (SKILLED_NURSING_FACILITY): Payer: Medicare Other | Admitting: Nurse Practitioner

## 2014-12-21 DIAGNOSIS — E876 Hypokalemia: Secondary | ICD-10-CM

## 2014-12-21 DIAGNOSIS — R609 Edema, unspecified: Secondary | ICD-10-CM

## 2014-12-21 DIAGNOSIS — K2100 Gastro-esophageal reflux disease with esophagitis, without bleeding: Secondary | ICD-10-CM

## 2014-12-21 DIAGNOSIS — K5900 Constipation, unspecified: Secondary | ICD-10-CM

## 2014-12-21 DIAGNOSIS — F4322 Adjustment disorder with anxiety: Secondary | ICD-10-CM

## 2014-12-21 DIAGNOSIS — D518 Other vitamin B12 deficiency anemias: Secondary | ICD-10-CM

## 2014-12-21 DIAGNOSIS — E871 Hypo-osmolality and hyponatremia: Secondary | ICD-10-CM

## 2014-12-21 DIAGNOSIS — G20 Parkinson's disease: Secondary | ICD-10-CM

## 2014-12-21 DIAGNOSIS — I10 Essential (primary) hypertension: Secondary | ICD-10-CM

## 2014-12-21 DIAGNOSIS — S32030S Wedge compression fracture of third lumbar vertebra, sequela: Secondary | ICD-10-CM

## 2014-12-21 NOTE — Progress Notes (Signed)
Patient ID: Kimberly Silva, female   DOB: Jan 29, 1926, 81 y.o.   MRN: [id number]   Code Status: DNR  No Known Allergies  Chief Complaint  Patient presents with  . Medical Management of Chronic Issues  . Acute Visit    BLE edema    HPI: Patient is a 81 y.o. female seen in the SNF at [facility] today for evaluation of BLE edema and other chronic medical conditions.     Hospitalized 12/08/2014-12/12/2014 for Urinary tract infection Dehydration, moderate Protein-calorie malnutrition, severe Hyponatremia, improving Hypertension, benign, uncontrolled, L3 compression fracture Parkinson disease   Presentation to JE:HUDJSH and weakness for the last 1 month. Patient had a fall one month ago for which he has been taking hydrocodone. She was admitted in September for bradycardia secondary to AV nodal blocking medications. Her Coreg was discontinued. This has  not been an  issue since. Patient was recently told that she has a  L3 compression fracture last week. rx with epidural steroid injection last week  Parkinson's disease Continue with Sinemet    Problem List Items Addressed This Visit    Parkinsonian tremor    Desires Sinemet tid-fine resting tremor fingers.     Hyponatremia - Primary    12/15/14 Na 134 12/22/14 Na 136-dc fluid restriction and Nacl(held since SNF admission), update BMP one week.     HYPOKALEMIA    12/22/14 K 3.8    GERD (gastroesophageal reflux disease)    Continue PPI-observe.     Essential hypertension    continue hydralazine,  uptitrate as needed, increase to 25 mg 3 times a day Recommended patient to uptitrate hydralazine for uncontrolled blood pressure, patient should be able to discontinue her other antihypertensive medications including Maxzide and lisinopril      Edema    BLE 1+, weight daily, update BMP and BNP-may consider diuretics.     Constipation    Continue Colace bid and change MiraLax daily.      Compression fracture of L3 lumbar  vertebra    Patient received epidural steroid injection last week for this Patient has been told that she is not a candidate for surgery for lumbar spinal stenosis Does not like Vicodin, recommend continuing tramadol and Tylenol for pain control PT/OT eval recommended SNF      ANEMIA, B12 DEFICIENCY    Continue B12.12/15/14 Hgb 9.5 12/22/14 Hgb 9.6       Adjustment disorder with anxious mood    Continue Sertraline 25mg  daily.  Ativan 0.5mg  bid 9am and 8pm.  Observe.         Review of Systems:  Review of Systems  Constitutional: Positive for malaise/fatigue. Negative for fever, chills, weight loss and diaphoresis.  HENT: Positive for hearing loss. Negative for congestion, ear discharge, ear pain, nosebleeds, sore throat and tinnitus.   Eyes: Negative for blurred vision, double vision, photophobia, pain, discharge and redness.  Respiratory: Negative for cough, hemoptysis, sputum production, shortness of breath, wheezing and stridor.   Cardiovascular: Positive for leg swelling. Negative for chest pain, palpitations, orthopnea, claudication and PND.       1+ BLE   Gastrointestinal: Negative for heartburn, nausea, vomiting, abdominal pain, diarrhea, constipation, blood in stool and melena.  Genitourinary: Negative for dysuria, urgency, frequency, hematuria and flank pain.  Musculoskeletal: Positive for back pain. Negative for joint pain, falls and neck pain.       Lower back pain radiates to the right thigh and knee.   Skin: Negative for rash.  Neurological: Positive  for tremors. Negative for dizziness, tingling, sensory change, speech change, focal weakness, seizures, loss of consciousness, weakness and headaches.  Endo/Heme/Allergies: Negative for environmental allergies and polydipsia. Does not bruise/bleed easily.  Psychiatric/Behavioral: Negative for depression, suicidal ideas, hallucinations, memory loss and substance abuse. The patient is nervous/anxious and has insomnia.       Past Medical History  Diagnosis Date  . HYPERLIPIDEMIA 02/21/2009  . HYPOKALEMIA 08/17/2007  . ANEMIA, B12 DEFICIENCY 03/13/2009  . HYPERTENSION 05/14/2007  . Blood in stool 01/25/2008  . OSTEOARTHRITIS 05/14/2007  . OSTEOPENIA 05/14/2007    2007 hx of fosamax use   . TREMOR 07/08/2007  . FREQUENCY, URINARY 08/19/2008  . LUMBAR STRAIN 09/21/2007  . Gastric polyp     panendo  5 2009  . History of ankle fracture   . Parkinson's disease   . Adenomatous colon polyp   . Lumbar compression fracture    Past Surgical History  Procedure Laterality Date  . Abdominal hysterectomy    . Fracture  2007    left ankle- Hx of fosamax use  . Dilation and curettage of uterus    . Cataract extraction, bilateral    . Colonoscopy N/A 07/19/2013    Procedure: COLONOSCOPY;  Surgeon: Lafayette Dragon, MD;  Location: [facility];  Service: Endoscopy;  Laterality: N/A;   Social History:   reports that she has never smoked. She has never used smokeless tobacco. She reports that she does not drink alcohol or use illicit drugs.  Family History  Problem Relation Age of Onset  . Heart attack Mother   . Coronary artery disease Mother   . Colon cancer Sister     x 2  . Breast cancer Sister     x 4  . Parkinsonism Brother   . Peripheral vascular disease      sibling  . Stroke Father   . Colon cancer Other     nephew  . Colon cancer Paternal Aunt   . Breast cancer Sister   . Breast cancer Sister     Medications: Patient's Medications  New Prescriptions   No medications on file  Previous Medications   ACETAMINOPHEN (TYLENOL) 325 MG TABLET    Take 2 tablets (650 mg total) by mouth every 6 (six) hours as needed for mild pain (or Fever >/= 101).   ASPIRIN 81 MG TABLET    Take 81 mg by mouth daily with breakfast.    CARBIDOPA-LEVODOPA (SINEMET IR) 25-100 MG PER TABLET    Take 0.5-1 tablets by mouth 3 (three) times daily. Take 1 tablet in the morning, 1/2 tablet at 1600, 1 tablet at bedtime   CHOLECALCIFEROL  (VITAMIN D3) 1000 UNITS CAPS    Take 1,000 Units by mouth daily with breakfast.    CYANOCOBALAMIN (,VITAMIN B-12,) 1000 MCG/ML INJECTION    Inject 1 mL (1,000 mcg total) into the muscle every 30 (thirty) days.   DOCUSATE SODIUM 100 MG CAPS    Take 100 mg by mouth 2 (two) times daily.   FEEDING SUPPLEMENT, ENSURE COMPLETE, (ENSURE COMPLETE) LIQD    Take 237 mLs by mouth 2 (two) times daily between meals.   FLUTICASONE (FLONASE) 50 MCG/ACT NASAL SPRAY    Place 2 sprays into both nostrils at bedtime.   HYDRALAZINE (APRESOLINE) 25 MG TABLET    Take 1 tablet (25 mg total) by mouth every 8 (eight) hours.   LORAZEPAM (ATIVAN) 0.5 MG TABLET    Take 1 tablet (0.5 mg total) by mouth 2 (two) times  daily as needed for anxiety.   MAGNESIUM 250 MG TABS    Take 250 mg by mouth daily with breakfast.    ONDANSETRON (ZOFRAN) 4 MG TABLET    Take 4 mg by mouth every 8 (eight) hours as needed for nausea or vomiting.   PANTOPRAZOLE (PROTONIX) 40 MG TABLET    Take 40 mg by mouth daily.   POLYETHYL GLYCOL-PROPYL GLYCOL (SYSTANE) 0.4-0.3 % SOLN    Apply 1 drop to eye 2 (two) times daily.    POLYETHYLENE GLYCOL (MIRALAX / GLYCOLAX) PACKET    Take 17 g by mouth daily as needed for mild constipation.    POTASSIUM CHLORIDE (K-DUR) 10 MEQ TABLET    Take 10 mEq by mouth 2 (two) times daily.    PRESCRIPTION MEDICATION    Steroid injection in lumbar area - at dr's office   SERTRALINE (ZOLOFT) 25 MG TABLET    Take 1 tablet (25 mg total) by mouth daily.   SODIUM CHLORIDE 1 G TABLET    Take 1 tablet (1 g total) by mouth 3 (three) times daily with meals.   TRAMADOL (ULTRAM) 50 MG TABLET    Take 1 tablet (50 mg total) by mouth every 8 (eight) hours as needed for moderate pain.  Modified Medications   No medications on file  Discontinued Medications   No medications on file     Physical Exam: Physical Exam  Constitutional: She is oriented to person, place, and time. She appears well-developed and well-nourished. No distress.    HENT:  Head: Normocephalic and atraumatic.  Right Ear: External ear normal.  Left Ear: External ear normal.  Nose: Nose normal.  Mouth/Throat: Oropharynx is clear and moist. No oropharyngeal exudate.  Eyes: Conjunctivae and EOM are normal. Pupils are equal, round, and reactive to light. Right eye exhibits no discharge. Left eye exhibits no discharge. No scleral icterus.  Neck: Normal range of motion. Neck supple. No JVD present. No tracheal deviation present. No thyromegaly present.  Cardiovascular: Normal rate, regular rhythm, normal heart sounds and intact distal pulses.  Exam reveals no friction rub.   No murmur heard. Pulmonary/Chest: Effort normal and breath sounds normal. No stridor. No respiratory distress. She has no wheezes. She has no rales. She exhibits no tenderness.  Abdominal: Soft. Bowel sounds are normal. She exhibits no distension and no mass. There is no tenderness. There is no rebound and no guarding.  Musculoskeletal: Normal range of motion. She exhibits edema and tenderness.  Lower back pain refers to the right thigh and knee.  1+ edema  Lymphadenopathy:    She has no cervical adenopathy.  Neurological: She is alert and oriented to person, place, and time. She has normal reflexes. No cranial nerve deficit. She exhibits normal muscle tone. Coordination normal.  Skin: Skin is warm and dry. No rash noted. She is not diaphoretic. No erythema. No pallor.  Psychiatric: She has a normal mood and affect. Her behavior is normal. Judgment and thought content normal.  Anxious.     Filed Vitals:   12/21/14 1610  BP: 134/70  Pulse: 90  Temp: 98 F (36.7 C)  TempSrc: Tympanic  Resp: 20      Labs reviewed: Basic Metabolic Panel:  Recent Labs  01/05/14 1152  08/22/14 2137  11/08/14 1149  12/08/14 1421  12/10/14 0438 12/11/14 0505 12/12/14 0453 12/15/14 12/22/14  NA 137  < >  --   < > 136  < >  --   < > 132* 134* 132*  134* 136*  K 3.7  < >  --   < > 3.7  < >  --    < > 3.8 3.5 3.5 4.0 3.8  CL 101  < >  --   < > 99  < >  --   < > 104 106 106  --   --   CO2 30  < >  --   < > 25  < >  --   < > 23 24 25   --   --   GLUCOSE 81  < >  --   < > 103*  < >  --   < > 98 92 98  --   --   BUN 20  < >  --   < > 19  < >  --   < > 12 14 13 13 18   CREATININE 1.1  < >  --   < > 1.2  < >  --   < > 0.90 0.80 0.73 1.0 0.9  CALCIUM 9.2  < >  --   < > 9.3  < >  --   < > 8.5 8.7 8.4  --   --   MG 1.9  < > 1.7  --  1.8  --  1.7  --   --   --   --   --   --   TSH 0.81  --   --   --   --   --  0.767  --   --   --   --  1.31  --   < > = values in this interval not displayed. Liver Function Tests:  Recent Labs  03/24/14 1027 12/08/14 1036 12/09/14 0158 12/15/14  AST 12 11 9  8*  ALT 3 <5 <5 8  ALKPHOS 41 59 51 44  BILITOT 0.7 0.5 0.3  --   PROT 6.9 6.6 5.5*  --   ALBUMIN 3.9 3.8 3.1*  --     Recent Labs  12/08/14 1036  LIPASE 27   No results for input(s): AMMONIA in the last 8760 hours. CBC:  Recent Labs  03/24/14 1027 07/19/14 1438  12/08/14 1036 12/09/14 0158 12/11/14 0505 12/12/14 0453 12/15/14 12/22/14  WBC 8.8 7.6  < > 7.0 6.7 6.9 6.9 6.3 5.9  NEUTROABS 7.0 5.8  --  5.5  --   --   --   --   --   HGB 13.4 12.3  < > 10.7* 9.3* 9.9* 9.2* 9.5* 9.6*  HCT 40.2 38.0  < > 32.5* 28.1* 30.4* 28.4* 29* 30*  MCV 86.1 86.0  < > 82.9 83.6 84.0 83.8  --   --   PLT 257.0 205  < > 269 233 226 208 230 208  < > = values in this interval not displayed. Lipid Panel: No results for input(s): CHOL, HDL, LDLCALC, TRIG, CHOLHDL, LDLDIRECT in the last 8760 hours.  Past Procedures:  07/20/14 MRI brain:  IMPRESSION: Unremarkable cranial MRI.  Assessment/Plan Hyponatremia 12/15/14 Na 134 12/22/14 Na 136-dc fluid restriction and Nacl(held since SNF admission), update BMP one week.   Parkinsonian tremor Desires Sinemet tid-fine resting tremor fingers.   Edema BLE 1+, weight daily, update BMP and BNP-may consider diuretics.   ANEMIA, B12 DEFICIENCY Continue  B12.12/15/14 Hgb 9.5 12/22/14 Hgb 9.6     Adjustment disorder with anxious mood Continue Sertraline 25mg  daily.  Ativan 0.5mg  bid 9am and 8pm.  Observe.    Constipation Continue Colace  bid and change MiraLax daily.    Compression fracture of L3 lumbar vertebra Patient received epidural steroid injection last week for this Patient has been told that she is not a candidate for surgery for lumbar spinal stenosis Does not like Vicodin, recommend continuing tramadol and Tylenol for pain control PT/OT eval recommended SNF    Essential hypertension continue hydralazine,  uptitrate as needed, increase to 25 mg 3 times a day Recommended patient to uptitrate hydralazine for uncontrolled blood pressure, patient should be able to discontinue her other antihypertensive medications including Maxzide and lisinopril    GERD (gastroesophageal reflux disease) Continue PPI-observe.   HYPOKALEMIA 12/22/14 K 3.8    Family/ Staff Communication: observe the patient  Goals of Care: [place]  Labs/tests ordered: CBC, CMP done 12/22/14. BNP/BMP one week.

## 2014-12-22 DIAGNOSIS — R609 Edema, unspecified: Secondary | ICD-10-CM | POA: Insufficient documentation

## 2014-12-22 LAB — CBC AND DIFFERENTIAL
HCT: 30 % — AB (ref 36–46)
Hemoglobin: 9.6 g/dL — AB (ref 12.0–16.0)
PLATELETS: 208 10*3/uL (ref 150–399)
WBC: 5.9 10*3/mL

## 2014-12-22 LAB — BASIC METABOLIC PANEL
BUN: 18 mg/dL (ref 4–21)
Creatinine: 0.9 mg/dL (ref 0.5–1.1)
Glucose: 89 mg/dL
Potassium: 3.8 mmol/L (ref 3.4–5.3)
Sodium: 136 mmol/L — AB (ref 137–147)

## 2014-12-22 NOTE — Assessment & Plan Note (Signed)
Patient received epidural steroid injection last week for this Patient has been told that she is not a candidate for surgery for lumbar spinal stenosis Does not like Vicodin, recommend continuing tramadol and Tylenol for pain control PT/OT eval recommended SNF

## 2014-12-22 NOTE — Assessment & Plan Note (Signed)
Continue B12.12/15/14 Hgb 9.5 12/22/14 Hgb 9.6

## 2014-12-22 NOTE — Assessment & Plan Note (Signed)
Desires Sinemet tid-fine resting tremor fingers.

## 2014-12-22 NOTE — Assessment & Plan Note (Signed)
Continue Colace bid and change MiraLax daily.

## 2014-12-22 NOTE — Assessment & Plan Note (Signed)
12/22/14 K 3.8

## 2014-12-22 NOTE — Assessment & Plan Note (Signed)
BLE 1+, weight daily, update BMP and BNP-may consider diuretics.

## 2014-12-22 NOTE — Assessment & Plan Note (Signed)
12/15/14 Na 134 12/22/14 Na 136-dc fluid restriction and Nacl(held since SNF admission), update BMP one week.

## 2014-12-22 NOTE — Assessment & Plan Note (Signed)
Continue PPI-observe.

## 2014-12-22 NOTE — Assessment & Plan Note (Signed)
continue hydralazine,  uptitrate as needed, increase to 25 mg 3 times a day Recommended patient to uptitrate hydralazine for uncontrolled blood pressure, patient should be able to discontinue her other antihypertensive medications including Maxzide and lisinopril

## 2014-12-22 NOTE — Assessment & Plan Note (Signed)
Continue Sertraline 25mg  daily.  Ativan 0.5mg  bid 9am and 8pm.  Observe.

## 2014-12-23 ENCOUNTER — Non-Acute Institutional Stay (SKILLED_NURSING_FACILITY): Payer: Medicare Other | Admitting: Internal Medicine

## 2014-12-23 ENCOUNTER — Telehealth: Payer: Self-pay | Admitting: Diagnostic Neuroimaging

## 2014-12-23 DIAGNOSIS — S32030D Wedge compression fracture of third lumbar vertebra, subsequent encounter for fracture with routine healing: Secondary | ICD-10-CM

## 2014-12-23 DIAGNOSIS — E43 Unspecified severe protein-calorie malnutrition: Secondary | ICD-10-CM

## 2014-12-23 DIAGNOSIS — F4322 Adjustment disorder with anxiety: Secondary | ICD-10-CM

## 2014-12-23 DIAGNOSIS — D518 Other vitamin B12 deficiency anemias: Secondary | ICD-10-CM

## 2014-12-23 DIAGNOSIS — G20C Parkinsonism, unspecified: Secondary | ICD-10-CM

## 2014-12-23 DIAGNOSIS — K219 Gastro-esophageal reflux disease without esophagitis: Secondary | ICD-10-CM

## 2014-12-23 DIAGNOSIS — G20 Parkinson's disease: Secondary | ICD-10-CM

## 2014-12-23 DIAGNOSIS — E871 Hypo-osmolality and hyponatremia: Secondary | ICD-10-CM

## 2014-12-23 DIAGNOSIS — K5900 Constipation, unspecified: Secondary | ICD-10-CM

## 2014-12-23 DIAGNOSIS — R609 Edema, unspecified: Secondary | ICD-10-CM

## 2014-12-23 NOTE — Telephone Encounter (Signed)
Our last OV note says the Rx was written for one tab three times daily.  I called back.  Spoke with Lelan Pons.  She said the PA at the facility wrote the Rx for one tab three times daily and she wanted to ensure this matched with our records.  They will call us back if anything further is needed.

## 2014-12-23 NOTE — Telephone Encounter (Signed)
Andres Ege, RN with [facility] [place] @ [phone] x [phone], stated patient requesting dosage for Rx carbidopa-levodopa (SINEMET IR) 25-100 MG per tablet be changed medication taken 3 x's a day.  Please call and advise.

## 2014-12-23 NOTE — Progress Notes (Signed)
Patient ID: Kimberly Silva, female   DOB: March 23, 1926, 81 y.o.   MRN: [id number]   Code Status: DNR  No Known Allergies  Chief Complaint  Patient presents with  . New Admit To SNF    HPI: Patient is a 81 y.o. female seen in the SNF at [facility] today for admission for rehab.      She was hospitalized 12/08/2014-12/12/2014 for Urinary tract infection, Dehydration, moderate Protein-calorie malnutrition, severe Hyponatremia, improving Hypertension, benign, uncontrolled, L3 compression fracture, Parkinson disease.    Presentation to [facility]:YIFOYD and weakness for the last 1 month. Patient had a fall one month ago for which he has been taking hydrocodone. She was admitted in September for bradycardia secondary to AV nodal blocking medications. Her Coreg was discontinued. This has  not been an  issue since. Patient was recently told that she has a  L3 compression fracture.   rx with epidural steroid injection last week  Parkinson's disease Continues with Sinemet--was clarified with neurology to be one tablet three times daily.  When I saw her, she was doing a lot better.  She continues to have some localized pain over L3 that sometimes radiates around her.  It is helped with tylenol and tramadol use.  Her legs remain edematous.  Her diuretic had been stopped in the hospital and was replaced with hydralazine.  Review of Systems:  Review of Systems  Constitutional: Positive for malaise/fatigue. Negative for fever, chills, weight loss and diaphoresis.  HENT: Positive for hearing loss. Negative for congestion, ear discharge, ear pain, nosebleeds, sore throat and tinnitus.   Eyes: Negative for blurred vision, double vision, photophobia, pain, discharge and redness.  Respiratory: Negative for cough, hemoptysis, sputum production, shortness of breath, wheezing and stridor.   Cardiovascular: Positive for leg swelling. Negative for chest pain, palpitations, orthopnea, claudication and PND.      1+ BLE   Gastrointestinal: Negative for heartburn, nausea, vomiting, abdominal pain, diarrhea, constipation, blood in stool and melena.  Genitourinary: Negative for dysuria, urgency, frequency, hematuria and flank pain.  Musculoskeletal: Positive for back pain. Negative for joint pain, falls and neck pain.       Lower back pain radiates to the right thigh and knee.   Skin: Negative for rash.  Neurological: Positive for tremors. Negative for dizziness, tingling, sensory change, speech change, focal weakness, seizures, loss of consciousness, weakness and headaches.  Endo/Heme/Allergies: Negative for environmental allergies and polydipsia. Does not bruise/bleed easily.  Psychiatric/Behavioral: Negative for depression, suicidal ideas, hallucinations, memory loss and substance abuse. The patient is nervous/anxious and has insomnia.      Past Medical History  Diagnosis Date  . HYPERLIPIDEMIA 02/21/2009  . HYPOKALEMIA 08/17/2007  . ANEMIA, B12 DEFICIENCY 03/13/2009  . HYPERTENSION 05/14/2007  . Blood in stool 01/25/2008  . OSTEOARTHRITIS 05/14/2007  . OSTEOPENIA 05/14/2007    2007 hx of fosamax use   . TREMOR 07/08/2007  . FREQUENCY, URINARY 08/19/2008  . LUMBAR STRAIN 09/21/2007  . Gastric polyp     panendo  5 2009  . History of ankle fracture   . Parkinson's disease   . Adenomatous colon polyp   . Lumbar compression fracture    Past Surgical History  Procedure Laterality Date  . Abdominal hysterectomy    . Fracture  2007    left ankle- Hx of fosamax use  . Dilation and curettage of uterus    . Cataract extraction, bilateral    . Colonoscopy N/A 07/19/2013    Procedure: COLONOSCOPY;  Surgeon:  Lafayette Dragon, MD;  Location: [facility];  Service: Endoscopy;  Laterality: N/A;   Social History:   reports that she has never smoked. She has never used smokeless tobacco. She reports that she does not drink alcohol or use illicit drugs.  Family History  Problem Relation Age of Onset  . Heart  attack Mother   . Coronary artery disease Mother   . Colon cancer Sister     x 2  . Breast cancer Sister     x 4  . Parkinsonism Brother   . Peripheral vascular disease      sibling  . Stroke Father   . Colon cancer Other     nephew  . Colon cancer Paternal Aunt   . Breast cancer Sister   . Breast cancer Sister     Medications: Patient's Medications  New Prescriptions   No medications on file  Previous Medications   ACETAMINOPHEN (TYLENOL) 325 MG TABLET    Take 2 tablets (650 mg total) by mouth every 6 (six) hours as needed for mild pain (or Fever >/= 101).   ASPIRIN 81 MG TABLET    Take 81 mg by mouth daily with breakfast.    CARBIDOPA-LEVODOPA (SINEMET IR) 25-100 MG PER TABLET    Take 0.5-1 tablets by mouth 3 (three) times daily. Take 1 tablet in the morning, 1/2 tablet at 1600, 1 tablet at bedtime   CHOLECALCIFEROL (VITAMIN D3) 1000 UNITS CAPS    Take 1,000 Units by mouth daily with breakfast.    CYANOCOBALAMIN (,VITAMIN B-12,) 1000 MCG/ML INJECTION    Inject 1 mL (1,000 mcg total) into the muscle every 30 (thirty) days.   DOCUSATE SODIUM 100 MG CAPS    Take 100 mg by mouth 2 (two) times daily.   FEEDING SUPPLEMENT, ENSURE COMPLETE, (ENSURE COMPLETE) LIQD    Take 237 mLs by mouth 2 (two) times daily between meals.   FLUTICASONE (FLONASE) 50 MCG/ACT NASAL SPRAY    Place 2 sprays into both nostrils at bedtime.   HYDRALAZINE (APRESOLINE) 25 MG TABLET    Take 1 tablet (25 mg total) by mouth every 8 (eight) hours.   LORAZEPAM (ATIVAN) 0.5 MG TABLET    Take 1 tablet (0.5 mg total) by mouth 2 (two) times daily as needed for anxiety.   MAGNESIUM 250 MG TABS    Take 250 mg by mouth daily with breakfast.    ONDANSETRON (ZOFRAN) 4 MG TABLET    Take 4 mg by mouth every 8 (eight) hours as needed for nausea or vomiting.   PANTOPRAZOLE (PROTONIX) 40 MG TABLET    Take 40 mg by mouth daily.   POLYETHYL GLYCOL-PROPYL GLYCOL (SYSTANE) 0.4-0.3 % SOLN    Apply 1 drop to eye 2 (two) times daily.     POLYETHYLENE GLYCOL (MIRALAX / GLYCOLAX) PACKET    Take 17 g by mouth daily as needed for mild constipation.    POTASSIUM CHLORIDE (K-DUR) 10 MEQ TABLET    Take 10 mEq by mouth 2 (two) times daily.    PRESCRIPTION MEDICATION    Steroid injection in lumbar area - at dr's office   SERTRALINE (ZOLOFT) 25 MG TABLET    Take 1 tablet (25 mg total) by mouth daily.   SODIUM CHLORIDE 1 G TABLET    Take 1 tablet (1 g total) by mouth 3 (three) times daily with meals.   TRAMADOL (ULTRAM) 50 MG TABLET    Take 1 tablet (50 mg total) by mouth every 8 (eight)  hours as needed for moderate pain.  Modified Medications   No medications on file  Discontinued Medications   No medications on file     Physical Exam: Physical Exam  Constitutional: She is oriented to person, place, and time. She appears well-developed and well-nourished. No distress.  HENT:  Head: Normocephalic and atraumatic.  Right Ear: External ear normal.  Left Ear: External ear normal.  Nose: Nose normal.  Mouth/Throat: Oropharynx is clear and moist. No oropharyngeal exudate.  Eyes: Conjunctivae and EOM are normal. Pupils are equal, round, and reactive to light. Right eye exhibits no discharge. Left eye exhibits no discharge. No scleral icterus.  Neck: Normal range of motion. Neck supple. No JVD present. No tracheal deviation present. No thyromegaly present.  Cardiovascular: Normal rate, regular rhythm, normal heart sounds and intact distal pulses.  Exam reveals no friction rub.   No murmur heard. Pulmonary/Chest: Effort normal and breath sounds normal. No stridor. No respiratory distress. She has no wheezes. She has no rales. She exhibits no tenderness.  Abdominal: Soft. Bowel sounds are normal. She exhibits no distension and no mass. There is no tenderness. There is no rebound and no guarding.  Musculoskeletal: Normal range of motion. She exhibits edema and tenderness.  Lower back pain refers to the right thigh and knee.  1+ edema    Lymphadenopathy:    She has no cervical adenopathy.  Neurological: She is alert and oriented to person, place, and time. She has normal reflexes. No cranial nerve deficit. She exhibits normal muscle tone. Coordination normal.  Skin: Skin is warm and dry. No rash noted. She is not diaphoretic. No erythema. No pallor.  Psychiatric: She has a normal mood and affect. Her behavior is normal. Judgment and thought content normal.  Anxious.     Filed Vitals:   12/23/14 0756  BP: 138/83  Pulse: 60  Temp: 98.9 F (37.2 C)  Resp: 18  Height: 5\' 1"  (1.549 m)  Weight: 115 lb 1.6 oz (52.209 kg)      Labs reviewed: Basic Metabolic Panel:  Recent Labs  01/05/14 1152  08/22/14 2137  11/08/14 1149  12/08/14 1421  12/10/14 0438 12/11/14 0505 12/12/14 0453 12/15/14 12/22/14  NA 137  < >  --   < > 136  < >  --   < > 132* 134* 132* 134* 136*  K 3.7  < >  --   < > 3.7  < >  --   < > 3.8 3.5 3.5 4.0 3.8  CL 101  < >  --   < > 99  < >  --   < > 104 106 106  --   --   CO2 30  < >  --   < > 25  < >  --   < > 23 24 25   --   --   GLUCOSE 81  < >  --   < > 103*  < >  --   < > 98 92 98  --   --   BUN 20  < >  --   < > 19  < >  --   < > 12 14 13 13 18   CREATININE 1.1  < >  --   < > 1.2  < >  --   < > 0.90 0.80 0.73 1.0 0.9  CALCIUM 9.2  < >  --   < > 9.3  < >  --   < > 8.5  8.7 8.4  --   --   MG 1.9  < > 1.7  --  1.8  --  1.7  --   --   --   --   --   --   TSH 0.81  --   --   --   --   --  0.767  --   --   --   --  1.31  --   < > = values in this interval not displayed. Liver Function Tests:  Recent Labs  03/24/14 1027 12/08/14 1036 12/09/14 0158 12/15/14  AST 12 11 9  8*  ALT 3 <5 <5 8  ALKPHOS 41 59 51 44  BILITOT 0.7 0.5 0.3  --   PROT 6.9 6.6 5.5*  --   ALBUMIN 3.9 3.8 3.1*  --     Recent Labs  12/08/14 1036  LIPASE 27   No results for input(s): AMMONIA in the last 8760 hours. CBC:  Recent Labs  03/24/14 1027 07/19/14 1438  12/08/14 1036 12/09/14 0158 12/11/14 0505  12/12/14 0453 12/15/14 12/22/14  WBC 8.8 7.6  < > 7.0 6.7 6.9 6.9 6.3 5.9  NEUTROABS 7.0 5.8  --  5.5  --   --   --   --   --   HGB 13.4 12.3  < > 10.7* 9.3* 9.9* 9.2* 9.5* 9.6*  HCT 40.2 38.0  < > 32.5* 28.1* 30.4* 28.4* 29* 30*  MCV 86.1 86.0  < > 82.9 83.6 84.0 83.8  --   --   PLT 257.0 205  < > 269 233 226 208 230 208  < > = values in this interval not displayed. Past Procedures:  07/20/14 MRI brain: Unremarkable cranial MRI.  Assessment/Plan Compression fracture of L3 lumbar vertebra Is tolerating tramadol and tylenol for pain and doing well working with PT, OT at SNF.    Protein-calorie malnutrition, severe Appetite improved, will monitor weights here and intake.  Parkinsonian tremor Says this was worse since she was taking only 2 instead of 3 sinemet per day.  Notes indicate that this was clarified to 3 pills per day and that is what she is taking now.    Hyponatremia Improved on repeat labs.  Edema Diuretic was stopped during hospitalization due to dehydration.  She also was started on hydralazine which can cause edema.  Discussed this with her.  She is elevating her legs with some improvement.  Has had heart block so bp med selections are limited.  ANEMIA, B12 DEFICIENCY Stable hgb.  No changes needed.  Adjustment disorder with anxious mood Mood was quite good during visit.  Cont zoloft and prn lorazepam.  Would consider uptitrating her zoloft if she is requiring frequent lorazepam.    GERD (gastroesophageal reflux disease) Stable.   Denied symptoms today. Cont protonix.  Constipation Cont colace and miralax as needed for this. She has been moving her bowels now.    Goals of Care: [place]  Labs/tests ordered: CBC, CMP done 12/22/14. BNP/BMP one week from that.

## 2014-12-26 ENCOUNTER — Other Ambulatory Visit: Payer: Self-pay

## 2014-12-26 ENCOUNTER — Encounter: Payer: Self-pay | Admitting: Internal Medicine

## 2014-12-26 MED ORDER — CARBIDOPA-LEVODOPA 25-100 MG PO TABS: 1.0000 | ORAL_TABLET | Freq: Three times a day (TID) | ORAL | Status: AC

## 2014-12-26 NOTE — Telephone Encounter (Signed)
Kimberly Silva with [facility] @ [phone], requesting Rx carbidopa-levodopa (SINEMET IR) 25-100 MG per tablet faxed with new dosage amount.  Please fax to [phone].

## 2014-12-26 NOTE — Telephone Encounter (Signed)
All info has been faxed

## 2014-12-26 NOTE — Assessment & Plan Note (Signed)
Stable hgb.  No changes needed.

## 2014-12-26 NOTE — Assessment & Plan Note (Signed)
Diuretic was stopped during hospitalization due to dehydration.  She also was started on hydralazine which can cause edema.  Discussed this with her.  She is elevating her legs with some improvement.  Has had heart block so bp med selections are limited.

## 2014-12-26 NOTE — Assessment & Plan Note (Signed)
Is tolerating tramadol and tylenol for pain and doing well working with PT, OT at SNF.

## 2014-12-26 NOTE — Assessment & Plan Note (Addendum)
Stable.   Denied symptoms today. Cont protonix.

## 2014-12-26 NOTE — Assessment & Plan Note (Signed)
Improved on repeat labs.

## 2014-12-26 NOTE — Assessment & Plan Note (Signed)
Mood was quite good during visit.  Cont zoloft and prn lorazepam.  Would consider uptitrating her zoloft if she is requiring frequent lorazepam.

## 2014-12-26 NOTE — Assessment & Plan Note (Signed)
Says this was worse since she was taking only 2 instead of 3 sinemet per day.  Notes indicate that this was clarified to 3 pills per day and that is what she is taking now.

## 2014-12-26 NOTE — Assessment & Plan Note (Signed)
Cont colace and miralax as needed for this. She has been moving her bowels now.

## 2014-12-26 NOTE — Assessment & Plan Note (Signed)
Appetite improved, will monitor weights here and intake.

## 2014-12-26 NOTE — Telephone Encounter (Signed)
Facility requests written Rx.  Thank you.

## 2014-12-27 NOTE — Telephone Encounter (Signed)
Rx has been faxed.

## 2014-12-28 ENCOUNTER — Encounter: Payer: Self-pay | Admitting: Nurse Practitioner

## 2014-12-28 ENCOUNTER — Non-Acute Institutional Stay (SKILLED_NURSING_FACILITY): Payer: Medicare Other | Admitting: Nurse Practitioner

## 2014-12-28 DIAGNOSIS — G20C Parkinsonism, unspecified: Secondary | ICD-10-CM

## 2014-12-28 DIAGNOSIS — K5900 Constipation, unspecified: Secondary | ICD-10-CM

## 2014-12-28 DIAGNOSIS — E871 Hypo-osmolality and hyponatremia: Secondary | ICD-10-CM

## 2014-12-28 DIAGNOSIS — K219 Gastro-esophageal reflux disease without esophagitis: Secondary | ICD-10-CM

## 2014-12-28 DIAGNOSIS — R609 Edema, unspecified: Secondary | ICD-10-CM

## 2014-12-28 DIAGNOSIS — S32030S Wedge compression fracture of third lumbar vertebra, sequela: Secondary | ICD-10-CM

## 2014-12-28 DIAGNOSIS — I10 Essential (primary) hypertension: Secondary | ICD-10-CM

## 2014-12-28 DIAGNOSIS — F4322 Adjustment disorder with anxiety: Secondary | ICD-10-CM

## 2014-12-28 DIAGNOSIS — E876 Hypokalemia: Secondary | ICD-10-CM

## 2014-12-28 DIAGNOSIS — G20 Parkinson's disease: Secondary | ICD-10-CM

## 2014-12-28 NOTE — Assessment & Plan Note (Signed)
continue hydralazine,  uptitrate as needed, increase to 25 mg 3 times a day Recommended patient to uptitrate hydralazine for uncontrolled blood pressure, patient should be able to discontinue her other antihypertensive medications including Maxzide and lisinopril

## 2014-12-28 NOTE — Assessment & Plan Note (Signed)
Says this was worse since she was taking only 2 instead of 3 sinemet per day.  Notes indicate that this was clarified to 3 pills per day and that is what she is taking now.

## 2014-12-28 NOTE — Assessment & Plan Note (Signed)
Is tolerating tramadol and tylenol for pain and doing well working with PT, OT at SNF.

## 2014-12-28 NOTE — Assessment & Plan Note (Signed)
Stable.   Denied symptoms today. Cont protonix.

## 2014-12-28 NOTE — Assessment & Plan Note (Signed)
Improved on repeat labs.

## 2014-12-28 NOTE — Assessment & Plan Note (Signed)
Cont colace and miralax as needed for this. She has been moving her bowels now.

## 2014-12-28 NOTE — Assessment & Plan Note (Signed)
Mood was quite good during visit.  Cont zoloft and prn lorazepam.  Would consider uptitrating her zoloft if she is requiring frequent lorazepam.

## 2014-12-28 NOTE — Progress Notes (Signed)
Patient ID: Kimberly Silva, female   DOB: December 31, 1925, 81 y.o.   MRN: [id number]   Code Status: DNR  No Known Allergies  Chief Complaint  Patient presents with  . Medical Management of Chronic Issues  . Acute Visit    BLE edema    HPI: Patient is a 81 y.o. female seen in the SNF at [facility] today for evaluation of BLE edema and other chronic medical conditions.     Hospitalized 12/08/2014-12/12/2014 for Urinary tract infection Dehydration, moderate Protein-calorie malnutrition, severe Hyponatremia, improving Hypertension, benign, uncontrolled, L3 compression fracture Parkinson disease   Presentation to IR:JJOACZ and weakness for the last 1 month. Patient had a fall one month ago for which he has been taking hydrocodone. She was admitted in September for bradycardia secondary to AV nodal blocking medications. Her Coreg was discontinued. This has  not been an  issue since. Patient was recently told that she has a  L3 compression fracture last week. rx with epidural steroid injection last week  Problem List Items Addressed This Visit    Parkinsonian tremor    Says this was worse since she was taking only 2 instead of 3 sinemet per day.  Notes indicate that this was clarified to 3 pills per day and that is what she is taking now.         Hyponatremia    Improved on repeat labs.       HYPOKALEMIA    12/22/14 K 3.8       GERD (gastroesophageal reflux disease)    Stable.   Denied symptoms today. Cont protonix.       Essential hypertension    continue hydralazine,  uptitrate as needed, increase to 25 mg 3 times a day Recommended patient to uptitrate hydralazine for uncontrolled blood pressure, patient should be able to discontinue her other antihypertensive medications including Maxzide and lisinopril         Edema - Primary    Diuretic was stopped during hospitalization due to dehydration.  She also was started on hydralazine which can cause edema.  Discussed this  with her.  She is elevating her legs with some improvement.  Has had heart block so bp med selections are limited. 12/28/13 Furosemide 10mg  daily. BMP/BNP pending. Daily weights: #114, #116, #117, #116, #117, #116       Constipation    Cont colace and miralax as needed for this. She has been moving her bowels now.         Compression fracture of L3 lumbar vertebra    Is tolerating tramadol and tylenol for pain and doing well working with PT, OT at SNF.         Adjustment disorder with anxious mood    Mood was quite good during visit.  Cont zoloft and prn lorazepam.  Would consider uptitrating her zoloft if she is requiring frequent lorazepam.            Review of Systems:  Review of Systems  Constitutional: Positive for malaise/fatigue. Negative for fever, chills, weight loss and diaphoresis.       Generalized  HENT: Positive for hearing loss. Negative for congestion, ear discharge, ear pain, nosebleeds, sore throat and tinnitus.   Eyes: Negative for blurred vision, double vision, photophobia, pain, discharge and redness.  Respiratory: Negative for cough, hemoptysis, sputum production, shortness of breath, wheezing and stridor.   Cardiovascular: Positive for leg swelling and PND. Negative for chest pain, palpitations, orthopnea and claudication.  Gastrointestinal: Positive for  nausea. Negative for heartburn, vomiting, abdominal pain, diarrhea, constipation, blood in stool and melena.  Genitourinary: Positive for frequency. Negative for dysuria, urgency, hematuria and flank pain.  Musculoskeletal: Positive for back pain and joint pain. Negative for myalgias, falls and neck pain.  Skin: Negative for itching and rash.  Neurological: Positive for tremors and weakness. Negative for dizziness, tingling, sensory change, speech change, focal weakness, seizures, loss of consciousness and headaches.  Endo/Heme/Allergies: Negative for environmental allergies and polydipsia. Does not  bruise/bleed easily.  Psychiatric/Behavioral: Positive for depression and memory loss. The patient is nervous/anxious and has insomnia.      Past Medical History  Diagnosis Date  . HYPERLIPIDEMIA 02/21/2009  . HYPOKALEMIA 08/17/2007  . ANEMIA, B12 DEFICIENCY 03/13/2009  . HYPERTENSION 05/14/2007  . Blood in stool 01/25/2008  . OSTEOARTHRITIS 05/14/2007  . OSTEOPENIA 05/14/2007    2007 hx of fosamax use   . TREMOR 07/08/2007  . FREQUENCY, URINARY 08/19/2008  . LUMBAR STRAIN 09/21/2007  . Gastric polyp     panendo  5 2009  . History of ankle fracture   . Parkinson's disease   . Adenomatous colon polyp   . Lumbar compression fracture    Past Surgical History  Procedure Laterality Date  . Abdominal hysterectomy    . Fracture  2007    left ankle- Hx of fosamax use  . Dilation and curettage of uterus    . Cataract extraction, bilateral    . Colonoscopy N/A 07/19/2013    Procedure: COLONOSCOPY;  Surgeon: Lafayette Dragon, MD;  Location: [facility];  Service: Endoscopy;  Laterality: N/A;   Social History:   reports that she has never smoked. She has never used smokeless tobacco. She reports that she does not drink alcohol or use illicit drugs.  Family History  Problem Relation Age of Onset  . Heart attack Mother   . Coronary artery disease Mother   . Colon cancer Sister     x 2  . Breast cancer Sister     x 4  . Parkinsonism Brother   . Peripheral vascular disease      sibling  . Stroke Father   . Colon cancer Other     nephew  . Colon cancer Paternal Aunt   . Breast cancer Sister   . Breast cancer Sister     Medications: Patient's Medications  New Prescriptions   No medications on file  Previous Medications   ACETAMINOPHEN (TYLENOL) 325 MG TABLET    Take 2 tablets (650 mg total) by mouth every 6 (six) hours as needed for mild pain (or Fever >/= 101).   ASPIRIN 81 MG TABLET    Take 81 mg by mouth daily with breakfast.    CARBIDOPA-LEVODOPA (SINEMET IR) 25-100 MG PER TABLET     Take 1 tablet by mouth 3 (three) times daily.   CHOLECALCIFEROL (VITAMIN D3) 1000 UNITS CAPS    Take 1,000 Units by mouth daily with breakfast.    CYANOCOBALAMIN (,VITAMIN B-12,) 1000 MCG/ML INJECTION    Inject 1 mL (1,000 mcg total) into the muscle every 30 (thirty) days.   DOCUSATE SODIUM 100 MG CAPS    Take 100 mg by mouth 2 (two) times daily.   FEEDING SUPPLEMENT, ENSURE COMPLETE, (ENSURE COMPLETE) LIQD    Take 237 mLs by mouth 2 (two) times daily between meals.   FLUTICASONE (FLONASE) 50 MCG/ACT NASAL SPRAY    Place 2 sprays into both nostrils at bedtime.   HYDRALAZINE (APRESOLINE) 25 MG TABLET  Take 1 tablet (25 mg total) by mouth every 8 (eight) hours.   LORAZEPAM (ATIVAN) 0.5 MG TABLET    Take 1 tablet (0.5 mg total) by mouth 2 (two) times daily as needed for anxiety.   MAGNESIUM 250 MG TABS    Take 250 mg by mouth daily with breakfast.    ONDANSETRON (ZOFRAN) 4 MG TABLET    Take 4 mg by mouth every 8 (eight) hours as needed for nausea or vomiting.   PANTOPRAZOLE (PROTONIX) 40 MG TABLET    Take 40 mg by mouth daily.   POLYETHYL GLYCOL-PROPYL GLYCOL (SYSTANE) 0.4-0.3 % SOLN    Apply 1 drop to eye 2 (two) times daily.    POLYETHYLENE GLYCOL (MIRALAX / GLYCOLAX) PACKET    Take 17 g by mouth daily as needed for mild constipation.    POTASSIUM CHLORIDE (K-DUR) 10 MEQ TABLET    Take 10 mEq by mouth 2 (two) times daily.    PRESCRIPTION MEDICATION    Steroid injection in lumbar area - at dr's office   SERTRALINE (ZOLOFT) 25 MG TABLET    Take 1 tablet (25 mg total) by mouth daily.   SODIUM CHLORIDE 1 G TABLET    Take 1 tablet (1 g total) by mouth 3 (three) times daily with meals.   TRAMADOL (ULTRAM) 50 MG TABLET    Take 1 tablet (50 mg total) by mouth every 8 (eight) hours as needed for moderate pain.  Modified Medications   No medications on file  Discontinued Medications   No medications on file     Physical Exam: Physical Exam  Constitutional: She is oriented to person, place, and  time. She appears well-developed and well-nourished. No distress.  HENT:  Head: Normocephalic and atraumatic.  Right Ear: External ear normal.  Left Ear: External ear normal.  Nose: Nose normal.  Mouth/Throat: Oropharynx is clear and moist. No oropharyngeal exudate.  Eyes: Conjunctivae and EOM are normal. Pupils are equal, round, and reactive to light. Right eye exhibits no discharge. Left eye exhibits no discharge. No scleral icterus.  Neck: Normal range of motion. Neck supple. No JVD present. No tracheal deviation present. No thyromegaly present.  Cardiovascular: Normal rate, regular rhythm, normal heart sounds and intact distal pulses.  Exam reveals no friction rub.   No murmur heard. Pulmonary/Chest: Effort normal and breath sounds normal. No stridor. No respiratory distress. She has no wheezes. She has no rales. She exhibits no tenderness.  Abdominal: Soft. Bowel sounds are normal. She exhibits no distension and no mass. There is no tenderness. There is no rebound and no guarding.  Musculoskeletal: Normal range of motion. She exhibits edema and tenderness.  Lower back pain refers to the right thigh and knee.  1+ edema  Lymphadenopathy:    She has no cervical adenopathy.  Neurological: She is alert and oriented to person, place, and time. She has normal reflexes. No cranial nerve deficit. She exhibits normal muscle tone. Coordination normal.  Skin: Skin is warm and dry. No rash noted. She is not diaphoretic. No erythema. No pallor.  Psychiatric: She has a normal mood and affect. Her behavior is normal. Judgment and thought content normal.  Anxious.     Filed Vitals:   12/28/14 1304  BP: 127/66  Pulse: 93  Temp: 97.9 F (36.6 C)  TempSrc: Tympanic  Resp: 20      Labs reviewed: Basic Metabolic Panel:  Recent Labs  01/05/14 1152  08/22/14 2137  11/08/14 1149  12/08/14 1421  12/10/14  6045 12/11/14 0505 12/12/14 0453 12/15/14 12/22/14  NA 137  < >  --   < > 136  < >  --    < > 132* 134* 132* 134* 136*  K 3.7  < >  --   < > 3.7  < >  --   < > 3.8 3.5 3.5 4.0 3.8  CL 101  < >  --   < > 99  < >  --   < > 104 106 106  --   --   CO2 30  < >  --   < > 25  < >  --   < > 23 24 25   --   --   GLUCOSE 81  < >  --   < > 103*  < >  --   < > 98 92 98  --   --   BUN 20  < >  --   < > 19  < >  --   < > 12 14 13 13 18   CREATININE 1.1  < >  --   < > 1.2  < >  --   < > 0.90 0.80 0.73 1.0 0.9  CALCIUM 9.2  < >  --   < > 9.3  < >  --   < > 8.5 8.7 8.4  --   --   MG 1.9  < > 1.7  --  1.8  --  1.7  --   --   --   --   --   --   TSH 0.81  --   --   --   --   --  0.767  --   --   --   --  1.31  --   < > = values in this interval not displayed. Liver Function Tests:  Recent Labs  03/24/14 1027 12/08/14 1036 12/09/14 0158 12/15/14  AST 12 11 9  8*  ALT 3 <5 <5 8  ALKPHOS 41 59 51 44  BILITOT 0.7 0.5 0.3  --   PROT 6.9 6.6 5.5*  --   ALBUMIN 3.9 3.8 3.1*  --     Recent Labs  12/08/14 1036  LIPASE 27   No results for input(s): AMMONIA in the last 8760 hours. CBC:  Recent Labs  03/24/14 1027 07/19/14 1438  12/08/14 1036 12/09/14 0158 12/11/14 0505 12/12/14 0453 12/15/14 12/22/14  WBC 8.8 7.6  < > 7.0 6.7 6.9 6.9 6.3 5.9  NEUTROABS 7.0 5.8  --  5.5  --   --   --   --   --   HGB 13.4 12.3  < > 10.7* 9.3* 9.9* 9.2* 9.5* 9.6*  HCT 40.2 38.0  < > 32.5* 28.1* 30.4* 28.4* 29* 30*  MCV 86.1 86.0  < > 82.9 83.6 84.0 83.8  --   --   PLT 257.0 205  < > 269 233 226 208 230 208  < > = values in this interval not displayed. Lipid Panel: No results for input(s): CHOL, HDL, LDLCALC, TRIG, CHOLHDL, LDLDIRECT in the last 8760 hours.  Past Procedures:  07/20/14 MRI brain:  IMPRESSION: Unremarkable cranial MRI.  Assessment/Plan Edema Diuretic was stopped during hospitalization due to dehydration.  She also was started on hydralazine which can cause edema.  Discussed this with her.  She is elevating her legs with some improvement.  Has had heart block so bp med selections  are limited. 12/28/13 Furosemide 10mg  daily.  BMP/BNP pending. Daily weights: #114, #116, #117, #116, #117, #116    Adjustment disorder with anxious mood Mood was quite good during visit.  Cont zoloft and prn lorazepam.  Would consider uptitrating her zoloft if she is requiring frequent lorazepam.      Compression fracture of L3 lumbar vertebra Is tolerating tramadol and tylenol for pain and doing well working with PT, OT at SNF.      Constipation Cont colace and miralax as needed for this. She has been moving her bowels now.      Essential hypertension continue hydralazine,  uptitrate as needed, increase to 25 mg 3 times a day Recommended patient to uptitrate hydralazine for uncontrolled blood pressure, patient should be able to discontinue her other antihypertensive medications including Maxzide and lisinopril      GERD (gastroesophageal reflux disease) Stable.   Denied symptoms today. Cont protonix.    Parkinsonian tremor Says this was worse since she was taking only 2 instead of 3 sinemet per day.  Notes indicate that this was clarified to 3 pills per day and that is what she is taking now.      Hyponatremia Improved on repeat labs.    HYPOKALEMIA 12/22/14 K 3.8      Family/ Staff Communication: observe the patient  Goals of Care: IL  Labs/tests ordered: CBC, CMP done 12/22/14. BNP/BMP one week.

## 2014-12-28 NOTE — Assessment & Plan Note (Signed)
12/22/14 K 3.8

## 2014-12-28 NOTE — Assessment & Plan Note (Addendum)
Diuretic was stopped during hospitalization due to dehydration.  She also was started on hydralazine which can cause edema.  Discussed this with her.  She is elevating her legs with some improvement.  Has had heart block so bp med selections are limited. 12/28/13 Furosemide 10mg  daily. BMP/BNP pending. Daily weights: #114, #116, #117, #116, #117, #116

## 2014-12-29 LAB — BASIC METABOLIC PANEL
BUN: 14 mg/dL (ref 4–21)
Creatinine: 0.8 mg/dL (ref 0.5–1.1)
GLUCOSE: 75 mg/dL
POTASSIUM: 4 mmol/L (ref 3.4–5.3)
Sodium: 134 mmol/L — AB (ref 137–147)

## 2015-01-02 ENCOUNTER — Other Ambulatory Visit: Payer: Self-pay | Admitting: Nurse Practitioner

## 2015-01-02 DIAGNOSIS — E871 Hypo-osmolality and hyponatremia: Secondary | ICD-10-CM

## 2015-01-02 DIAGNOSIS — R609 Edema, unspecified: Secondary | ICD-10-CM

## 2015-02-17 ENCOUNTER — Ambulatory Visit: Payer: BC Managed Care – PPO | Admitting: Internal Medicine

## 2015-02-17 ENCOUNTER — Ambulatory Visit (INDEPENDENT_AMBULATORY_CARE_PROVIDER_SITE_OTHER): Payer: Medicare Other | Admitting: Internal Medicine

## 2015-02-17 ENCOUNTER — Encounter: Payer: Self-pay | Admitting: Internal Medicine

## 2015-02-17 VITALS — BP 152/72 | Temp 98.2°F | Wt 110.5 lb

## 2015-02-17 DIAGNOSIS — G20 Parkinson's disease: Secondary | ICD-10-CM

## 2015-02-17 DIAGNOSIS — Z09 Encounter for follow-up examination after completed treatment for conditions other than malignant neoplasm: Secondary | ICD-10-CM

## 2015-02-17 DIAGNOSIS — G8929 Other chronic pain: Secondary | ICD-10-CM

## 2015-02-17 DIAGNOSIS — Z79899 Other long term (current) drug therapy: Secondary | ICD-10-CM | POA: Diagnosis not present

## 2015-02-17 DIAGNOSIS — R5383 Other fatigue: Secondary | ICD-10-CM

## 2015-02-17 DIAGNOSIS — D649 Anemia, unspecified: Secondary | ICD-10-CM

## 2015-02-17 DIAGNOSIS — I10 Essential (primary) hypertension: Secondary | ICD-10-CM

## 2015-02-17 DIAGNOSIS — E538 Deficiency of other specified B group vitamins: Secondary | ICD-10-CM

## 2015-02-17 DIAGNOSIS — R54 Age-related physical debility: Secondary | ICD-10-CM

## 2015-02-17 DIAGNOSIS — IMO0001 Reserved for inherently not codable concepts without codable children: Secondary | ICD-10-CM

## 2015-02-17 DIAGNOSIS — G20C Parkinsonism, unspecified: Secondary | ICD-10-CM

## 2015-02-17 DIAGNOSIS — F4323 Adjustment disorder with mixed anxiety and depressed mood: Secondary | ICD-10-CM

## 2015-02-17 DIAGNOSIS — R110 Nausea: Secondary | ICD-10-CM

## 2015-02-17 DIAGNOSIS — M2550 Pain in unspecified joint: Secondary | ICD-10-CM

## 2015-02-17 LAB — CBC WITH DIFFERENTIAL/PLATELET
Basophils Absolute: 0 10*3/uL (ref 0.0–0.1)
Basophils Relative: 0.1 % (ref 0.0–3.0)
EOS ABS: 0 10*3/uL (ref 0.0–0.7)
Eosinophils Relative: 0.5 % (ref 0.0–5.0)
HCT: 31.3 % — ABNORMAL LOW (ref 36.0–46.0)
HEMOGLOBIN: 10.2 g/dL — AB (ref 12.0–15.0)
LYMPHS ABS: 1.2 10*3/uL (ref 0.7–4.0)
Lymphocytes Relative: 14.1 % (ref 12.0–46.0)
MCHC: 32.6 g/dL (ref 30.0–36.0)
MCV: 76.5 fl — AB (ref 78.0–100.0)
Monocytes Absolute: 0.8 10*3/uL (ref 0.1–1.0)
Monocytes Relative: 9.5 % (ref 3.0–12.0)
Neutro Abs: 6.5 10*3/uL (ref 1.4–7.7)
Neutrophils Relative %: 75.8 % (ref 43.0–77.0)
PLATELETS: 400 10*3/uL (ref 150.0–400.0)
RBC: 4.08 Mil/uL (ref 3.87–5.11)
RDW: 14.4 % (ref 11.5–15.5)
WBC: 8.6 10*3/uL (ref 4.0–10.5)

## 2015-02-17 LAB — BASIC METABOLIC PANEL
BUN: 23 mg/dL (ref 6–23)
BUN: 23 mg/dL — AB (ref 4–21)
CALCIUM: 10 mg/dL (ref 8.4–10.5)
CO2: 31 mEq/L (ref 19–32)
CREATININE: 1.06 mg/dL (ref 0.40–1.20)
CREATININE: 1.1 mg/dL (ref 0.5–1.1)
Chloride: 97 mEq/L (ref 96–112)
GFR: 52.13 mL/min — AB (ref >60.00)
Glucose, Bld: 113 mg/dL — ABNORMAL HIGH (ref 70–99)
Glucose: 113 mg/dL
Potassium: 3.3 mEq/L — ABNORMAL LOW (ref 3.5–5.1)
Potassium: 3.3 mmol/L — AB (ref 3.4–5.3)
Sodium: 134 mEq/L — ABNORMAL LOW (ref 135–145)
Sodium: 134 mmol/L — AB (ref 137–147)

## 2015-02-17 LAB — IBC PANEL
IRON: 15 ug/dL — AB (ref 42–145)
Saturation Ratios: 3.8 % — ABNORMAL LOW (ref 20.0–50.0)
TRANSFERRIN: 279 mg/dL (ref 212.0–360.0)

## 2015-02-17 LAB — VITAMIN B12: Vitamin B-12: 593 pg/mL (ref 211–911)

## 2015-02-17 NOTE — Progress Notes (Signed)
Pre visit review using our clinic review tool, if applicable. No additional management support is needed unless otherwise documented below in the visit note.  Chief Complaint  Patient presents with  . Follow-up    post hospital rehab evaluation    HPI: Kimberly Silva 81 y.o.  Comes in with daughters  for fu evaluation after last few month   of medical status changes  djd  comression fx  parkinsonian sx   Anxiety and hx of av block and syncope that was  Triggered prob by meds b cloker adn coreg .  She is going to transfer care    To geriatric  Care dr green and NP    [facility]  Has pain  Spinal stenosis  Planning on injection for helps sometimes. Has ot pt  At [facility] .  Pain  Tramadol tid and tylenol .  As needed  Both daughters concerned about her decrease interest and staying to herself  Tramadol helps the pain ? If med causing sx .  Dr Nelva Bush suggested trying 2 1/2 doses of tramadol 25 mg  Instead of 50 to see if still controls pain .  Family   concern about decline    depression like   .   Eating "ok"  Was changed wot omeprazole without presmission has been on protonix and needs form signed to get her back on protonix  Missed some vit b12 but back on track  ROS: See pertinent positives and negatives per HPI. No cp sob falling tremors about the same .  No bleeding uti sx  See past notes from hospital and other provider  Had hyponatremia and dehydration at some point   Past Medical History  Diagnosis Date  . HYPERLIPIDEMIA 02/21/2009  . HYPOKALEMIA 08/17/2007  . ANEMIA, B12 DEFICIENCY 03/13/2009  . HYPERTENSION 05/14/2007  . Blood in stool 01/25/2008  . OSTEOARTHRITIS 05/14/2007  . OSTEOPENIA 05/14/2007    2006-04-01 hx of fosamax use   . TREMOR 07/08/2007  . FREQUENCY, URINARY 08/19/2008  . LUMBAR STRAIN 09/21/2007  . Gastric polyp     panendo  5 2008/04/01  . History of ankle fracture   . Parkinson's disease   . Adenomatous colon polyp   . Lumbar compression  fracture     Family History  Problem Relation Age of Onset  . Heart attack Mother   . Coronary artery disease Mother   . Colon cancer Sister     x 2  . Breast cancer Sister     x 4  . Parkinsonism Brother   . Peripheral vascular disease      sibling  . Stroke Father   . Colon cancer Other     nephew  . Colon cancer Paternal Aunt   . Breast cancer Sister   . Breast cancer Sister     History   Social History  . Marital Status: Widowed    Spouse Name: N/A  . Number of Children: 5  . Years of Education: BA   Occupational History  . Retired     Pharmacist, hospital   Social History Main Topics  . Smoking status: Never Smoker   . Smokeless tobacco: Never Used  . Alcohol Use: No  . Drug Use: No  . Sexual Activity: Not on file   Other Topics Concern  . None   Social History Narrative   Husband died in [place] in Apr 01, 2008 of leukemia   Widowed   [facility]irement assisted  living home.   [facility]   [id number]   Caffeine Use: 1 soda daily    Outpatient Encounter Prescriptions as of 02/17/2015  Medication Sig  . aspirin 81 MG tablet Take 81 mg by mouth daily with breakfast.   . carbidopa-levodopa (SINEMET IR) 25-100 MG per tablet Take 1 tablet by mouth 3 (three) times daily.  . Cholecalciferol (VITAMIN D3) 1000 UNITS CAPS Take 1,000 Units by mouth daily with breakfast.   . cyanocobalamin (,VITAMIN B-12,) 1000 MCG/ML injection Inject 1 mL (1,000 mcg total) into the muscle every 30 (thirty) days.  Marland Kitchen docusate sodium 100 MG CAPS Take 100 mg by mouth 2 (two) times daily.  . fluticasone (FLONASE) 50 MCG/ACT nasal spray Place 2 sprays into both nostrils at bedtime.  . furosemide (LASIX) 20 MG tablet Take 10 mg by mouth daily.  . hydrALAZINE (APRESOLINE) 25 MG tablet Take 1 tablet (25 mg total) by mouth every 8 (eight) hours.  Marland Kitchen LORazepam (ATIVAN) 0.5 MG tablet Take 1 tablet (0.5 mg total) by mouth 2 (two) times daily as needed for anxiety. (Patient taking differently: Take 0.5 mg by mouth 2  (two) times daily. )  . Magnesium 250 MG TABS Take 250 mg by mouth daily with breakfast.   . omeprazole (PRILOSEC) 20 MG capsule Take 20 mg by mouth daily.  . ondansetron (ZOFRAN) 4 MG tablet Take 4 mg by mouth every 8 (eight) hours as needed for nausea or vomiting.  [name] Glycol-Propyl Glycol (SYSTANE) 0.4-0.3 % SOLN Apply 1 drop to eye 2 (two) times daily.   . polyethylene glycol (MIRALAX / GLYCOLAX) packet Take 17 g by mouth daily as needed for mild constipation.   . potassium chloride (K-DUR) 10 MEQ tablet Take 10 mEq by mouth 2 (two) times daily.   Marland Kitchen PRESCRIPTION MEDICATION Steroid injection in lumbar area - at dr's office  . sertraline (ZOLOFT) 25 MG tablet Take 1 tablet (25 mg total) by mouth daily. (Patient taking differently: Take 25 mg by mouth at bedtime. )  . traMADol (ULTRAM) 50 MG tablet Take 1 tablet (50 mg total) by mouth every 8 (eight) hours as needed for moderate pain.  . [DISCONTINUED] acetaminophen (TYLENOL) 325 MG tablet Take 650 mg by mouth every 6 (six) hours as needed for mild pain.  . [DISCONTINUED] sodium chloride 1 G tablet Take 1 tablet (1 g total) by mouth 3 (three) times daily with meals.  Marland Kitchen acetaminophen (TYLENOL) 500 MG tablet Take 2 tablets (1,000 mg total) by mouth 2 (two) times daily.  . pantoprazole (PROTONIX) 40 MG tablet Take 40 mg by mouth daily.  . [DISCONTINUED] acetaminophen (TYLENOL) 325 MG tablet Take 2 tablets (650 mg total) by mouth every 6 (six) hours as needed for mild pain (or Fever >/= 101).  . [DISCONTINUED] feeding supplement, ENSURE COMPLETE, (ENSURE COMPLETE) LIQD Take 237 mLs by mouth 2 (two) times daily between meals.    EXAM:  BP 152/72 mmHg  Temp(Src) 98.2 F (36.8 C) (Oral)  Wt 110 lb 8 oz (50.122 kg)  Body mass index is 20.89 kg/(m^2).  GENERAL: vitals reviewed and listed above, alert, oriented, appears well hydrated and in no acute distress independent tremors  Less than in the past HEENT: atraumatic, conjunctiva  clear,  no obvious abnormalities on inspection of external nose and ears  NECK: no obvious masses on inspection palpation  LUNGS: clear to auscultation bilaterally, no wheezes, rales or rhonchi, good air movement CV: HRRR, no clubbing cyanosis 1+   peripheral edema,  nl cap refill  MS: moves all extremities without noticeable focal  Abnormality DJD changes  walks with walker  independent PSYCH: alert  cooperative,   Looks flat poss depression emotional at times     ASSESSMENT AND PLAN:  Discussed the following assessment and plan:  Essential hypertension - Plan: Basic metabolic panel, CBC with Differential/Platelet, IBC panel, Vitamin B12  Decreased energy - anemia in jan recheck , bleeding  Medication management - Plan: Basic metabolic panel, CBC with Differential/Platelet, IBC panel, Vitamin B12  Age factor - Plan: Basic metabolic panel, CBC with Differential/Platelet, IBC panel, Vitamin B12  Hospital discharge follow-up - Plan: Basic metabolic panel, CBC with Differential/Platelet, IBC panel, Vitamin B12  Nausea - has been on ppi last weval dr Olevia Perches 2014  - Plan: Basic metabolic panel, CBC with Differential/Platelet, IBC panel, Vitamin B12  Parkinsonian tremor - Plan: Basic metabolic panel, CBC with Differential/Platelet, IBC panel, Vitamin B12  Anemia, unspecified anemia type - Plan: Basic metabolic panel, CBC with Differential/Platelet, IBC panel, Vitamin B12  Vitamin B12 deficiency - Plan: CBC with Differential/Platelet, Vitamin B12  Chronic joint pain - best if on provider does pain managment  at a time   but advise taking tylenol  bid ( not prn) an then add tramadol with trial of 25 mg as per dr Nelva Bush   Adjustment disorder with mixed anxiety and depressed mood - poss side effects of meds and pain related . pt family to continue zoloft and  pt wants  to remeain onf ativan dis risk of cns sx  We discussed  A number of items   And concern about health  metabolic , depression, se of meds  all possible and chronic pain.   Form  Signed  -Patient advised to return or notify health care team  if symptoms worsen ,persist or new concerns arise. Wt Readings from Last 3 Encounters:  02/17/15 110 lb 8 oz (50.122 kg)  12/23/14 115 lb 1.6 oz (52.209 kg)  12/08/14 113 lb 9.6 oz (51.529 kg)     Patient Instructions  Anemia noted in last labs . Would repeat at next labs .   Make sure getting  b 12  Injections . Try lower dose of tramadol  25 mg at  A dose    50 mg less per day . Take acetmenophen on a regular basis bid .   Depression  And medications can cause som of these sx .  Perhaps dr Nelva Bush office can rx the pain management at this time.       Standley Brooking. Sevana Grandinetti M.D. Total visit 40  mins > 50% spent counseling and coordinating care    Lab Results  Component Value Date   WBC 8.6 02/17/2015   HGB 10.2* 02/17/2015   HCT 31.3* 02/17/2015   PLT 400.0 02/17/2015   GLUCOSE 113* 02/17/2015   CHOL 199 04/24/2012   TRIG 122.0 04/24/2012   HDL 69.30 04/24/2012   LDLDIRECT 101.1 04/16/2011   LDLCALC 105* 04/24/2012   ALT 8 12/15/2014   AST 8* 12/15/2014   NA 134* 02/17/2015   K 3.3* 02/17/2015   CL 97 02/17/2015   CREATININE 1.06 02/17/2015   BUN 23 02/17/2015   CO2 31 02/17/2015   TSH 1.31 12/15/2014

## 2015-02-17 NOTE — Patient Instructions (Addendum)
Anemia noted in last labs . Would repeat at next labs .   Make sure getting  b 12  Injections . Try lower dose of tramadol  25 mg at  A dose    50 mg less per day . Take acetmenophen on a regular basis bid .   Depression  And medications can cause som of these sx .  Perhaps dr Nelva Bush office can rx the pain management at this time.

## 2015-02-19 MED ORDER — ACETAMINOPHEN 500 MG PO TABS: 1000.0000 mg | ORAL_TABLET | Freq: Two times a day (BID) | ORAL | Status: DC

## 2015-02-22 ENCOUNTER — Ambulatory Visit: Payer: BC Managed Care – PPO | Admitting: Internal Medicine

## 2015-02-23 ENCOUNTER — Encounter: Payer: Self-pay | Admitting: Diagnostic Neuroimaging

## 2015-02-23 ENCOUNTER — Ambulatory Visit (INDEPENDENT_AMBULATORY_CARE_PROVIDER_SITE_OTHER): Payer: Medicare Other | Admitting: Diagnostic Neuroimaging

## 2015-02-23 VITALS — BP 164/72 | HR 103 | Ht 59.0 in | Wt 108.8 lb

## 2015-02-23 DIAGNOSIS — M545 Low back pain: Secondary | ICD-10-CM | POA: Diagnosis not present

## 2015-02-23 DIAGNOSIS — G20 Parkinson's disease: Secondary | ICD-10-CM

## 2015-02-23 DIAGNOSIS — F32A Depression, unspecified: Secondary | ICD-10-CM

## 2015-02-23 DIAGNOSIS — F329 Major depressive disorder, single episode, unspecified: Secondary | ICD-10-CM

## 2015-02-23 NOTE — Patient Instructions (Signed)
Continue current medications. 

## 2015-02-23 NOTE — Progress Notes (Signed)
[facility]  PATIENT: Kimberly Silva DOB: 12-Oct-1926  REFERRING CLINICIAN:  HISTORY FROM: patient and daughter REASON FOR VISIT: follow up   HISTORICAL  CHIEF COMPLAINT:  Chief Complaint  Patient presents with  . Follow-up    Parkinson's Disease     HISTORY OF PRESENT ILLNESS:   UPDATE 02/23/15: Since last visit, continue to decline with more anxiety, depression, confusion at night, back pain, tremor. Went to hosp in Dec 2015 for UTI, poor PO intake. Now in [facility] assisted living section since 01/11/15.   UPDATE 02/17/14: Since last visit, overall stable. Notes good tremor control and coordination in the mornings. Some wearing off by 4:30pm. Takes carb/levo 25/100 1 tab BID (7:45am, 4:30pm). Uses a walker and cane. Having some left leg problems, eval'd by ortho. Had MRI of lumbar spine, with some arthritis, disc bulging and cyst. Recommended to have epidural steroid injections.  UPDATE 01/25/13: Tremor remains stable with carbodopa/levodopa twice daily before meals. She is exercising 3x weekly. She is back on Propranolol and her essential tremor improved.She has no new complaints.     UPDATE 06/24/12:Tremor is better with Ursula Alert Geradine Girt.She is curently taking BID.   Pt developed nausea on Requip and stopped the drug. She continues with exercise class 3 x week, she has concluded her PT, OT. Her essential tremor is worse since PCP took her off propranolol and placed her on Metoprolol and she claims B/P is about the same. See ROS  UPDATE 04/08/12: Tremor progressing. Some increased fatigue, when standing in line for food. Discussed med options.  UPDATE: 10/08/11. No change in tremor, mild at rest. Intermittent jaw tremor noted. Was exercising but has not in the last several weeks due to a bowel problem. She is due to see Dr. Olevia Perches tomorrow. Discussed other medications for tremor, pt states "I have trouble with aspirin". No recent falls, remains independent in  all ADL's, continues to drive short distances.   UPDATE 07/10/11: Began Ropinirole after last visit with Dr. Leta Baptist, she stopped the medication after a few days due to severe nausea. Tremor is same.  She is reluctant to try other  PD meds  now.She is exercising 3 x weekly. She walks other days.   UPDATE 02/19/11: Now on propranolol 120mg  daily, with mild subjective improvement in tremor.  Tremor mainly when she hlds arms at posture in certain ways, but also at rest.  No side effects, except for reports of vivid dreams.  Planning to move into [facility] after Apr 1.  No falls.  UPDATE 11/13/10: Doing about the same; now on propranolol 60mg  daily per PCP, without improvement in tremor.  We discussed sinemet, but she wants to try higher propranolol first.  PRIOR HPI (05/11/10): 81 year old right-handed female with history of hypertension and B12 deficiency presenting for evaluation of tremor.  For the past 15-20 years patient has noticed a fine tremor and her bilateral hands particularly in her thumbs. The tremor is worse when she is carrying objects or holding her hands out to the side. She has not noticed tremor at rest. In 2008 she began to develop a jaw and voice tremor. The tremor worsens when she is around people or is nervous. Tremor is reduced when she is relaxing. She has a brother who may have had Parkinson's disease before he died. She has a sister with multiple sclerosis but she did not have tremor. There are no other family members with tremor or similar symptoms. She denies walking or  balance difficulties. She is otherwise doing well medically.   REVIEW OF SYSTEMS: Full 14 system review of systems performed and notable only for easy bruising easy bleeding tremors draping aching muscle difficulty walking runny nose decreased appetite.  ALLERGIES: No Known Allergies  HOME MEDICATIONS: Outpatient Prescriptions Prior to Visit  Medication Sig Dispense Refill  . acetaminophen  (TYLENOL) 500 MG tablet Take 2 tablets (1,000 mg total) by mouth 2 (two) times daily. 60 tablet 0  . aspirin 81 MG tablet Take 81 mg by mouth daily with breakfast.     . carbidopa-levodopa (SINEMET IR) 25-100 MG per tablet Take 1 tablet by mouth 3 (three) times daily. 270 tablet 0  . Cholecalciferol (VITAMIN D3) 1000 UNITS CAPS Take 1,000 Units by mouth daily with breakfast.     . cyanocobalamin (,VITAMIN B-12,) 1000 MCG/ML injection Inject 1 mL (1,000 mcg total) into the muscle every 30 (thirty) days. 10 mL 0  . docusate sodium 100 MG CAPS Take 100 mg by mouth 2 (two) times daily. 10 capsule 0  . fluticasone (FLONASE) 50 MCG/ACT nasal spray Place 2 sprays into both nostrils at bedtime.    . furosemide (LASIX) 20 MG tablet Take 10 mg by mouth daily.    . hydrALAZINE (APRESOLINE) 25 MG tablet Take 1 tablet (25 mg total) by mouth every 8 (eight) hours. 90 tablet 3  . LORazepam (ATIVAN) 0.5 MG tablet Take 1 tablet (0.5 mg total) by mouth 2 (two) times daily as needed for anxiety. (Patient taking differently: Take 0.5 mg by mouth 2 (two) times daily. ) 30 tablet 0  . Magnesium 250 MG TABS Take 250 mg by mouth daily with breakfast.     . omeprazole (PRILOSEC) 20 MG capsule Take 20 mg by mouth daily.    . ondansetron (ZOFRAN) 4 MG tablet Take 4 mg by mouth every 8 (eight) hours as needed for nausea or vomiting.    . pantoprazole (PROTONIX) 40 MG tablet Take 40 mg by mouth daily.    [name] Glycol-Propyl Glycol (SYSTANE) 0.4-0.3 % SOLN Apply 1 drop to eye 2 (two) times daily.     . polyethylene glycol (MIRALAX / GLYCOLAX) packet Take 17 g by mouth daily as needed for mild constipation.     . potassium chloride (K-DUR) 10 MEQ tablet Take 10 mEq by mouth 2 (two) times daily.     . sertraline (ZOLOFT) 25 MG tablet Take 1 tablet (25 mg total) by mouth daily. (Patient taking differently: Take 25 mg by mouth at bedtime. ) 30 tablet 1  . traMADol (ULTRAM) 50 MG tablet Take 1 tablet (50 mg total) by mouth  every 8 (eight) hours as needed for moderate pain. 60 tablet 0  . PRESCRIPTION MEDICATION Steroid injection in lumbar area - at dr's office     No facility-administered medications prior to visit.    PAST MEDICAL HISTORY: Past Medical History  Diagnosis Date  . HYPERLIPIDEMIA 02/21/2009  . HYPOKALEMIA 08/17/2007  . ANEMIA, B12 DEFICIENCY 03/13/2009  . HYPERTENSION 05/14/2007  . Blood in stool 01/25/2008  . OSTEOARTHRITIS 05/14/2007  . OSTEOPENIA 05/14/2007    2007 hx of fosamax use   . TREMOR 07/08/2007  . FREQUENCY, URINARY 08/19/2008  . LUMBAR STRAIN 09/21/2007  . Gastric polyp     panendo  5 2009  . History of ankle fracture   . Parkinson's disease   . Adenomatous colon polyp   . Lumbar compression fracture     PAST SURGICAL HISTORY: Past Surgical History  Procedure Laterality Date  . Abdominal hysterectomy    . Fracture  2006-04-10    left ankle- Hx of fosamax use  . Dilation and curettage of uterus    . Cataract extraction, bilateral    . Colonoscopy N/A 07/19/2013    Procedure: COLONOSCOPY;  Surgeon: Lafayette Dragon, MD;  Location: [facility];  Service: Endoscopy;  Laterality: N/A;    FAMILY HISTORY: Family History  Problem Relation Age of Onset  . Heart attack Mother   . Coronary artery disease Mother   . Colon cancer Sister     x 2  . Breast cancer Sister     x 4  . Parkinsonism Brother   . Peripheral vascular disease      sibling  . Stroke Father   . Colon cancer Other     nephew  . Colon cancer Paternal Aunt   . Breast cancer Sister   . Breast cancer Sister     SOCIAL HISTORY:  History   Social History  . Marital Status: Widowed    Spouse Name: N/A  . Number of Children: 5  . Years of Education: BA   Occupational History  . Retired     Pharmacist, hospital   Social History Main Topics  . Smoking status: Never Smoker   . Smokeless tobacco: Never Used  . Alcohol Use: No  . Drug Use: No  . Sexual Activity: Not on file   Other Topics Concern  . Not on file    Social History Narrative   Husband died in [place] in April 10, 2008 of [facility] assisted [facility].   [facility]   G7P5   Caffeine Use: 1 soda daily     PHYSICAL EXAM  Filed Vitals:   02/23/15 1252 02/23/15 1256  BP:  164/72  Pulse:  103  Height: 4\' 11"  (1.499 m)   Weight: 108 lb 12.8 oz (49.351 kg)     Not recorded      Body mass index is 21.96 kg/(m^2).  General: Patient is awake, alert and in no acute distress.  Well developed and groomed.  MASKED FACIES. Neck is supple. Cardiovascular: Heart is regular rate and rhythm with no murmurs.  Neurologic Exam  Mental Status: Awake, alert.  Language is fluent and comprehension intact. Cranial Nerves: Pupils are equal and reactive to light.  Visual fields are full to confrontation.  Conjugate eye movements are full and symmetric.  Facial sensation and strength are symmetric.  Hearing is intact.  Palate elevated symmetrically and uvula is midline.  Shoulder shug is symmetric.  Tongue is midline. Neg Myerson's sign Motor: Normal bulk and tone. REST TREMOR (BUE). MILD POSTURAL AND INTENTION TREMOR (LUE > RUE).  JAW AND VOICE TREMOR.  COGWHEELING IN BUE WITH CONTRALATERAL REINFORCEMENT. BRADYKINESIA (L > R). Full strength in the upper and lower extremities.  No pronator drift.  Sensory: Intact and symmetric to light touch. Coordination: No ataxia or dysmetria on finger-nose or rapid alternating movement testing. Gait and Station: Narrow based smooth gait. SLOW. USING A ROLLATOR WALKER; STOOPED POSTURE. DIFFICULTY STANDING (NEEDS ASSISTANCE) Reflexes: Deep tendon reflexes in the upper and lower extremity are present and symmetric.    DIAGNOSTIC DATA (LABS, IMAGING, TESTING) - I reviewed patient records, labs, notes, testing and imaging myself where available.  Lab Results  Component Value Date   WBC 8.6 02/17/2015   HGB 10.2* 02/17/2015   HCT 31.3* 02/17/2015   MCV 76.5* 02/17/2015   PLT 400.0  02/17/2015      Component Value Date/Time   NA 134* 02/17/2015 1414   NA 134* 12/29/2014   K 3.3* 02/17/2015 1414   CL 97 02/17/2015 1414   CO2 31 02/17/2015 1414   GLUCOSE 113* 02/17/2015 1414   BUN 23 02/17/2015 1414   BUN 14 12/29/2014   CREATININE 1.06 02/17/2015 1414   CREATININE 0.8 12/29/2014   CALCIUM 10.0 02/17/2015 1414   PROT 5.5* 12/09/2014 0158   ALBUMIN 3.1* 12/09/2014 0158   AST 8* 12/15/2014   ALT 8 12/15/2014   ALKPHOS 44 12/15/2014   BILITOT 0.3 12/09/2014 0158   GFRNONAA 75* 12/12/2014 0453   GFRAA 87* 12/12/2014 0453   Lab Results  Component Value Date   CHOL 199 04/24/2012   HDL 69.30 04/24/2012   LDLCALC 105* 04/24/2012   LDLDIRECT 101.1 04/16/2011   TRIG 122.0 04/24/2012   CHOLHDL 3 04/24/2012   No results found for: HGBA1C Lab Results  Component Value Date   CBULAGTX64 680 02/17/2015   Lab Results  Component Value Date   TSH 1.31 12/15/2014   07/20/14 TTE - Normal LV size with EF 55-60%. Normal RV size and systolic function. No significant valvular abnormalities.  07/20/14 EEG - normal  07/20/14 MRI BRAIN - unremarkable "I reviewed images myself and there is moderate chronic small vessel ischemic disease and mild atrophy. Nothing acute" -[facility]    ASSESSMENT AND PLAN  81 y.o. right-handed female with hypertension and B12 deficiency, with bilateral upper extremity tremor since early 1990's. Now with jaw and voice tremor (since 2008).  Exam notable for mixed tremor (RESTING < POSTURAL), cogwheeling and bradykinesia and gait abnormality.  Tremor is worsening. Depression is worsening.   Dx: parkinson's disease + essential tremor + depression + chronic pain (low back)  PLAN 1. Continue Carbodopa/levodopa 25/100mg  TID. 2. End of life planning discussed; advised to focus on quality/comfort and minimize specialist evals. Pain and mood management seem to be the primary issues currently. Will ask PCP or palliative care to help out.  Return in about 1  year (around 02/23/2016), or if symptoms worsen or fail to improve.    Penni Bombard, MD 03/06/2247, 2:50 PM Certified in Neurology, Neurophysiology and Neuroimaging  [facility] [address], [place], [place] [address] [phone]

## 2015-03-03 ENCOUNTER — Telehealth: Payer: Self-pay | Admitting: Internal Medicine

## 2015-03-03 NOTE — Telephone Encounter (Signed)
Medical Records

## 2015-03-03 NOTE — Telephone Encounter (Signed)
Tywan called from [facility] and is requesting patient recent lab and progress notes  can be faxed to  [phone]

## 2015-03-06 ENCOUNTER — Telehealth: Payer: Self-pay | Admitting: Internal Medicine

## 2015-03-06 NOTE — Telephone Encounter (Signed)
I agree   Decrease to  1 per day for 4-7 days and then  Can  DC   Altogether or use sparingly   With close observation.

## 2015-03-06 NOTE — Telephone Encounter (Signed)
Pt daughter would like an order to hold night time  ativan . Pt ativan was held over weekend for 3 night. Please call pt daughter Lesleigh Noe

## 2015-03-06 NOTE — Telephone Encounter (Signed)
Spoke to [place].  She stated the pt stays confused during the day when she takes the Ativan at night.  Her tramadol has been adjusted by Dr. Nelva Bush.  Facility has held the Ativan over the weekend and the family and patient has noticed a vast difference.  Lesleigh Noe would like to get her off the Ativan completely.  Needs order to D/C evening ativan.  Still has PRN if needed. Would like directions to wean off Ativan. Pt will switch to Dr. Carlota Raspberry on 04/06/15 at [facility].

## 2015-03-07 NOTE — Telephone Encounter (Signed)
Spoke to [place] and informed her of new directions for Ativan.  Order faxed and received transmission report the the fax was successful.

## 2015-03-09 ENCOUNTER — Other Ambulatory Visit: Payer: Self-pay | Admitting: Nurse Practitioner

## 2015-03-09 DIAGNOSIS — D518 Other vitamin B12 deficiency anemias: Secondary | ICD-10-CM

## 2015-03-09 DIAGNOSIS — E876 Hypokalemia: Secondary | ICD-10-CM

## 2015-03-13 ENCOUNTER — Telehealth: Payer: Self-pay | Admitting: *Deleted

## 2015-03-13 ENCOUNTER — Telehealth: Payer: Self-pay | Admitting: Diagnostic Neuroimaging

## 2015-03-13 NOTE — Telephone Encounter (Signed)
Spoke with the pt daughter on the phone and she wants Dr. Leta Baptist to order a home health aide to come out to help the pt at night since she has sundowning issues. She states that the nursing facility suggested this so that the pt does not have to move into skilled nursing at this time. I told the pts daughter that she may have to go through the PCP or contact the MD Dr. Nyoka Cowden at the facility to get him to place an order so that he can follow her progress instead of Dr. Leta Baptist. I told the daughter that I would get back to her. If she calls again, tell her that she needs to contact Dr. Nyoka Cowden for a home health order.

## 2015-03-13 NOTE — Telephone Encounter (Signed)
error 

## 2015-03-13 NOTE — Telephone Encounter (Signed)
Patient's daughter, [name] @ [phone] requesting an order for home health nurse.  Please call and advise.

## 2015-03-17 ENCOUNTER — Telehealth: Payer: Self-pay | Admitting: *Deleted

## 2015-03-17 NOTE — Telephone Encounter (Signed)
Daughter called and was made aware that she needs to call Dr. Carlota Raspberry for the home heath order. The patient does not see Dr. Carlota Raspberry until 04-06-15. The daughter wants a call back because she feels like she has wasted a week on trying to get this done. If the daughter was made aware of this sooner she would have gotten one of the patients other physicians to do the order. The daughter is a little upset about this and would like a call back when [place] and [place] gets back into the office. Please advises.

## 2015-03-21 NOTE — Telephone Encounter (Signed)
Called and explained in a voicemail to the daughter that I was not in the office after Tuesday of last week 03/13/15 and Dr. Mamie Nick was not in the office after Wednesday and that I had left a message in the computer for the phone staff to relay back to her when she called. I also stated an apology for not being able to return her call as it was already after 5 when I was done talking with Dr. Leta Baptist about the day. I told her that I had called the nursing home and spoken with [place] RN who was the RN on her mothers hall and had confirmed an appt with Dr. Nyoka Cowden on 03/23/15 and also had mentioned that I was told about the home health aides. I also reiterated that Dr. Nyoka Cowden would be the one who would now take over as her PCP. I told her to call back if she had any other issues.

## 2015-03-21 NOTE — Telephone Encounter (Signed)
Note from 03/20/15 1530 Called [facility] and spoke with [place], RN who was the nurse for pt. She stated that the family had gotten a private aid for night time, and the appt had been changed to 03/23/15 to see Dr. Nyoka Cowden. She also stated that they would be meeting with all the pertinent staff to discuss moving her to SNF on 03/30/15. I thanked her for her time

## 2015-03-23 ENCOUNTER — Non-Acute Institutional Stay: Payer: Medicare Other | Admitting: Nurse Practitioner

## 2015-03-23 VITALS — BP 164/70 | HR 80 | Temp 98.0°F | Ht 59.0 in | Wt 111.0 lb

## 2015-03-23 DIAGNOSIS — K5900 Constipation, unspecified: Secondary | ICD-10-CM | POA: Diagnosis not present

## 2015-03-23 DIAGNOSIS — M79604 Pain in right leg: Secondary | ICD-10-CM

## 2015-03-23 DIAGNOSIS — E876 Hypokalemia: Secondary | ICD-10-CM

## 2015-03-23 DIAGNOSIS — I10 Essential (primary) hypertension: Secondary | ICD-10-CM

## 2015-03-23 DIAGNOSIS — M545 Low back pain: Secondary | ICD-10-CM | POA: Diagnosis not present

## 2015-03-23 DIAGNOSIS — D509 Iron deficiency anemia, unspecified: Secondary | ICD-10-CM

## 2015-03-23 DIAGNOSIS — R609 Edema, unspecified: Secondary | ICD-10-CM | POA: Diagnosis not present

## 2015-03-23 DIAGNOSIS — G20 Parkinson's disease: Secondary | ICD-10-CM | POA: Diagnosis not present

## 2015-03-23 DIAGNOSIS — K921 Melena: Secondary | ICD-10-CM | POA: Diagnosis not present

## 2015-03-23 DIAGNOSIS — K219 Gastro-esophageal reflux disease without esophagitis: Secondary | ICD-10-CM | POA: Diagnosis not present

## 2015-03-23 DIAGNOSIS — D519 Vitamin B12 deficiency anemia, unspecified: Secondary | ICD-10-CM | POA: Diagnosis not present

## 2015-03-23 DIAGNOSIS — F4322 Adjustment disorder with anxiety: Secondary | ICD-10-CM | POA: Diagnosis not present

## 2015-03-23 NOTE — Progress Notes (Signed)
Patient ID: Kimberly Silva, female   DOB: December 15, 1926, 82 y.o.   MRN: [id number]   Code Status: DNR  No Known Allergies  Chief Complaint  Patient presents with  . Medical Management of Chronic Issues    New Patient, moved to [place] 12/2014. Here with daughters Kimberly Silva  . Anxiety  . Foot Swelling    ankles  . Constipation    HPI: Patient is a 81 y.o. female seen in the clinic at [facility] today for confusion, depression, back pain, constipation, edema, GERD,  and other chronic medical conditions.  Problem List Items Addressed This Visit    Adjustment disorder with anxious mood - Primary    More anxious and POA reported more confusion episodes associated with Lorazepam frequent use-will increase zoloft to 50mg  and decrease Lorazepam to daily prn. The patient requires assistance with her ADLs due to her impaired cognition.         Anemia, B12 deficiency    02/17/15 Vit B12 593, continue Vit B12      BLOOD IN STOOL    Now new, Guaiac stool + today, will f/u FOBT x3 and may f/u GI as needed.       Constipation    Senokot S II po qhs. Continue Colace bid and Miralax daily.      Edema    Diuretic was stopped during hospitalization due to dehydration.  She also was started on hydralazine which can cause edema.  Discussed this with her.  She is elevating her legs with some improvement.  Has had heart block so bp med selections are limited. 12/28/13 Furosemide 10mg  daily. 12/29/14 BNP 83.2 03/23/15 noted more edema in BLE-increase Furosemide to 20mg  and Kcl to 27meq, f/u BMP in one week.         Essential hypertension    Recommended patient to uptitrate hydralazine for uncontrolled blood pressure, patient should be able to discontinue her other antihypertensive medications including Maxzide and lisinopril continue hydralazine 25 mg 3 times a day          GERD (gastroesophageal reflux disease)    Stable.   Denied symptoms today. Cont protonix.      HYPOKALEMIA   02/17/15 K 3.3 03/23/15 f/u BMP in one week.        IDA (iron deficiency anemia)    02/17/15 Iron 15, Fe sat 3.8% 03/23/15 add Fe 325mg  po, f/u CBC one month.        Low back pain radiating to right leg    Pain is managed with Tramadol 50mg  8am and 4pm, 25mg  12am, Tylenol 1000mg  12 noon and 8pm. May consider taper down Tramadol use to eliminate possible cause of contusion.       Parkinsonian tremor    Takes Sinemet 25/100 tid, mask facial looks, no disabling tremor noted, anxious mood at times.            Review of Systems:  Review of Systems  Constitutional: Negative for fever, chills and diaphoresis.       Generalized  HENT: Positive for hearing loss. Negative for congestion, ear discharge, ear pain, nosebleeds, sore throat and tinnitus.   Eyes: Negative for photophobia, pain, discharge and redness.  Respiratory: Negative for cough, shortness of breath, wheezing and stridor.   Cardiovascular: Positive for leg swelling. Negative for chest pain and palpitations.  Gastrointestinal: Positive for constipation. Negative for nausea, vomiting, abdominal pain, diarrhea and blood in stool.  Endocrine: Negative for polydipsia.  Genitourinary: Positive for frequency. Negative  for dysuria, urgency, hematuria and flank pain.  Musculoskeletal: Positive for back pain. Negative for myalgias and neck pain.  Skin: Negative for color change, pallor and rash.  Allergic/Immunologic: Negative for environmental allergies.  Neurological: Positive for tremors and weakness. Negative for dizziness, seizures and headaches.  Hematological: Does not bruise/bleed easily.  Psychiatric/Behavioral: The patient is nervous/anxious.       Past Medical History  Diagnosis Date  . HYPERLIPIDEMIA 02/21/2009  . HYPOKALEMIA 08/17/2007  . ANEMIA, B12 DEFICIENCY 03/13/2009  . HYPERTENSION 05/14/2007  . Blood in stool 01/25/2008  . OSTEOARTHRITIS 05/14/2007  . OSTEOPENIA 05/14/2007    2007 hx of fosamax use   . TREMOR  07/08/2007  . FREQUENCY, URINARY 08/19/2008  . LUMBAR STRAIN 09/21/2007  . Gastric polyp     panendo  5 2009  . History of ankle fracture   . Parkinson's disease   . Adenomatous colon polyp   . Lumbar compression fracture    Past Surgical History  Procedure Laterality Date  . Abdominal hysterectomy  1974  . Fracture  2007    left ankle- Hx of fosamax use  . Dilation and curettage of uterus    . Cataract extraction, bilateral  20000  . Colonoscopy N/A 07/19/2013    Procedure: COLONOSCOPY;  Surgeon: Lafayette Dragon, MD;  Location: [facility];  Service: Endoscopy;  Laterality: N/A;  . Breast cyst aspiration     Social History:   reports that she has never smoked. She has never used smokeless tobacco. She reports that she does not drink alcohol or use illicit drugs.  Family History  Problem Relation Age of Onset  . Heart attack Mother   . Coronary artery disease Mother   . Colon cancer Sister     x 2  . Breast cancer Sister     x 4  . Parkinsonism Brother   . Peripheral vascular disease      sibling  . Stroke Father   . Colon cancer Other     nephew  . Colon cancer Paternal Aunt   . Breast cancer Sister   . Breast cancer Sister     Medications: Patient's Medications  New Prescriptions   No medications on file  Previous Medications   ACETAMINOPHEN (TYLENOL) 500 MG TABLET    Take 2 tablets (1,000 mg total) by mouth 2 (two) times daily.   ASPIRIN 81 MG TABLET    Take 81 mg by mouth daily with breakfast.    CARBIDOPA-LEVODOPA (SINEMET IR) 25-100 MG PER TABLET    Take 1 tablet by mouth 3 (three) times daily.   CHOLECALCIFEROL (VITAMIN D3) 1000 UNITS CAPS    Take 1,000 Units by mouth daily with breakfast.    CYANOCOBALAMIN (,VITAMIN B-12,) 1000 MCG/ML INJECTION    Inject 1 mL (1,000 mcg total) into the muscle every 30 (thirty) days.   DOCUSATE SODIUM 100 MG CAPS    Take 100 mg by mouth 2 (two) times daily.   FLUTICASONE (FLONASE) 50 MCG/ACT NASAL SPRAY    Place 2 sprays into  both nostrils at bedtime.   FUROSEMIDE (LASIX) 20 MG TABLET    Take 10 mg by mouth daily.   HYDRALAZINE (APRESOLINE) 25 MG TABLET    Take 1 tablet (25 mg total) by mouth every 8 (eight) hours.   LORAZEPAM (ATIVAN) 0.5 MG TABLET    Take 1 tablet (0.5 mg total) by mouth 2 (two) times daily as needed for anxiety.   MAGNESIUM 250 MG TABS  Take 250 mg by mouth daily with breakfast.    OMEPRAZOLE (PRILOSEC) 20 MG CAPSULE    Take 20 mg by mouth daily.   ONDANSETRON (ZOFRAN) 4 MG TABLET    Take 4 mg by mouth every 8 (eight) hours as needed for nausea or vomiting.   PANTOPRAZOLE (PROTONIX) 40 MG TABLET    Take 40 mg by mouth daily.   POLYETHYL GLYCOL-PROPYL GLYCOL (SYSTANE) 0.4-0.3 % SOLN    Apply 1 drop to eye 2 (two) times daily.    POLYETHYLENE GLYCOL (MIRALAX / GLYCOLAX) PACKET    Take 17 g by mouth daily as needed for mild constipation.    POTASSIUM CHLORIDE (K-DUR) 10 MEQ TABLET    Take 10 mEq by mouth 2 (two) times daily.    SERTRALINE (ZOLOFT) 25 MG TABLET    Take 1 tablet (25 mg total) by mouth daily.   TRAMADOL (ULTRAM) 50 MG TABLET    Take 1 tablet (50 mg total) by mouth every 8 (eight) hours as needed for moderate pain.  Modified Medications   No medications on file  Discontinued Medications   No medications on file     Physical Exam: Physical Exam  Constitutional: She is oriented to person, place, and time. She appears well-developed and well-nourished. No distress.  HENT:  Head: Normocephalic and atraumatic.  Right Ear: External ear normal.  Left Ear: External ear normal.  Nose: Nose normal.  Mouth/Throat: Oropharynx is clear and moist. No oropharyngeal exudate.  Eyes: Conjunctivae and EOM are normal. Pupils are equal, round, and reactive to light. Right eye exhibits no discharge. Left eye exhibits no discharge. No scleral icterus.  Neck: Normal range of motion. Neck supple. No JVD present. No tracheal deviation present. No thyromegaly present.  Cardiovascular: Normal rate,  regular rhythm, normal heart sounds and intact distal pulses.  Exam reveals no friction rub.   No murmur heard. Pulmonary/Chest: Effort normal and breath sounds normal. No stridor. No respiratory distress. She has no wheezes. She has no rales. She exhibits no tenderness.  Abdominal: Soft. Bowel sounds are normal. She exhibits no distension and no mass. There is no tenderness. There is no rebound and no guarding.  Genitourinary: Guaiac positive stool.  Musculoskeletal: Normal range of motion. She exhibits edema and tenderness.  Lower back pain refers to the right thigh and knee.  1+ edema  Lymphadenopathy:    She has no cervical adenopathy.  Neurological: She is alert and oriented to person, place, and time. She has normal reflexes. No cranial nerve deficit. She exhibits normal muscle tone. Coordination abnormal.  Skin: Skin is warm and dry. No rash noted. She is not diaphoretic. No erythema. No pallor.  Psychiatric: She has a normal mood and affect. Her behavior is normal. Judgment and thought content normal.  Anxious.     Filed Vitals:   03/23/15 1534  BP: 164/70  Pulse: 80  Temp: 98 F (36.7 C)  TempSrc: Oral  Height: 4\' 11"  (1.499 m)  Weight: 111 lb (50.349 kg)  SpO2: 99%      Labs reviewed: Basic Metabolic Panel:  Recent Labs  08/22/14 2137  11/08/14 1149  12/08/14 1421  12/11/14 0505 12/12/14 0453 12/15/14  12/29/14 02/17/15 02/17/15 1414  NA  --   < > 136  < >  --   < > 134* 132* 134*  < > 134* 134* 134*  K  --   < > 3.7  < >  --   < > 3.5 3.5 4.0  < >  4.0 3.3* 3.3*  CL  --   < > 99  < >  --   < > 106 106  --   --   --   --  97  CO2  --   < > 25  < >  --   < > 24 25  --   --   --   --  31  GLUCOSE  --   < > 103*  < >  --   < > 92 98  --   --   --   --  113*  BUN  --   < > 19  < >  --   < > 14 13 13   < > 14 23* 23  CREATININE  --   < > 1.2  < >  --   < > 0.80 0.73 1.0  < > 0.8 1.1 1.06  CALCIUM  --   < > 9.3  < >  --   < > 8.7 8.4  --   --   --   --  10.0  MG 1.7   --  1.8  --  1.7  --   --   --   --   --   --   --   --   TSH  --   --   --   --  0.767  --   --   --  1.31  --   --   --   --   < > = values in this interval not displayed. Liver Function Tests:  Recent Labs  12/08/14 1036 12/09/14 0158 12/15/14  AST 11 9 8*  ALT <5 <5 8  ALKPHOS 59 51 44  BILITOT 0.5 0.3  --   PROT 6.6 5.5*  --   ALBUMIN 3.8 3.1*  --     Recent Labs  12/08/14 1036  LIPASE 27   No results for input(s): AMMONIA in the last 8760 hours. CBC:  Recent Labs  07/19/14 1438  12/08/14 1036  12/11/14 0505 12/12/14 0453 12/15/14 12/22/14 02/17/15 1414  WBC 7.6  < > 7.0  < > 6.9 6.9 6.3 5.9 8.6  NEUTROABS 5.8  --  5.5  --   --   --   --   --  6.5  HGB 12.3  < > 10.7*  < > 9.9* 9.2* 9.5* 9.6* 10.2*  HCT 38.0  < > 32.5*  < > 30.4* 28.4* 29* 30* 31.3*  MCV 86.0  < > 82.9  < > 84.0 83.8  --   --  76.5*  PLT 205  < > 269  < > 226 208 230 208 400.0  < > = values in this interval not displayed. Lipid Panel: No results for input(s): CHOL, HDL, LDLCALC, TRIG, CHOLHDL, LDLDIRECT in the last 8760 hours. Anemia Panel:  Recent Labs  02/17/15 1414  IRON 15*  VITAMINB12 593    Past Procedures:     Assessment/Plan Adjustment disorder with anxious mood More anxious and POA reported more confusion episodes associated with Lorazepam frequent use-will increase zoloft to 50mg  and decrease Lorazepam to daily prn. The patient requires assistance with her ADLs due to her impaired cognition.      BLOOD IN STOOL Now new, Guaiac stool + today, will f/u FOBT x3 and may f/u GI as needed.    IDA (iron deficiency anemia) 02/17/15 Iron 15, Fe sat 3.8% 03/23/15 add Fe 325mg  po, f/u CBC one  month.     Low back pain radiating to right leg Pain is managed with Tramadol 50mg  8am and 4pm, 25mg  12am, Tylenol 1000mg  12 noon and 8pm. May consider taper down Tramadol use to eliminate possible cause of contusion.    Constipation Senokot S II po qhs. Continue Colace bid and  Miralax daily.   Edema Diuretic was stopped during hospitalization due to dehydration.  She also was started on hydralazine which can cause edema.  Discussed this with her.  She is elevating her legs with some improvement.  Has had heart block so bp med selections are limited. 12/28/13 Furosemide 10mg  daily. 12/29/14 BNP 83.2 03/23/15 noted more edema in BLE-increase Furosemide to 20mg  and Kcl to 46meq, f/u BMP in one week.      Parkinsonian tremor Takes Sinemet 25/100 tid, mask facial looks, no disabling tremor noted, anxious mood at times.      Essential hypertension Recommended patient to uptitrate hydralazine for uncontrolled blood pressure, patient should be able to discontinue her other antihypertensive medications including Maxzide and lisinopril continue hydralazine 25 mg 3 times a day       GERD (gastroesophageal reflux disease) Stable.   Denied symptoms today. Cont protonix.   Anemia, B12 deficiency 02/17/15 Vit B12 593, continue Vit B12   HYPOKALEMIA 02/17/15 K 3.3 03/23/15 f/u BMP in one week.       Family/ Staff Communication: observe the patient.   Goals of Care: [place]  Labs/tests ordered: Guaiac stool x3, CBC, BMP

## 2015-03-26 DIAGNOSIS — D509 Iron deficiency anemia, unspecified: Secondary | ICD-10-CM | POA: Insufficient documentation

## 2015-03-26 NOTE — Assessment & Plan Note (Signed)
Pain is managed with Tramadol 50mg  8am and 4pm, 25mg  12am, Tylenol 1000mg  12 noon and 8pm. May consider taper down Tramadol use to eliminate possible cause of contusion.

## 2015-03-26 NOTE — Assessment & Plan Note (Signed)
More anxious and POA reported more confusion episodes associated with Lorazepam frequent use-will increase zoloft to 50mg  and decrease Lorazepam to daily prn. The patient requires assistance with her ADLs due to her impaired cognition.

## 2015-03-26 NOTE — Assessment & Plan Note (Signed)
Recommended patient to uptitrate hydralazine for uncontrolled blood pressure, patient should be able to discontinue her other antihypertensive medications including Maxzide and lisinopril continue hydralazine 25 mg 3 times a day

## 2015-03-26 NOTE — Assessment & Plan Note (Signed)
Senokot S II po qhs. Continue Colace bid and Miralax daily.

## 2015-03-26 NOTE — Assessment & Plan Note (Signed)
Now new, Guaiac stool + today, will f/u FOBT x3 and may f/u GI as needed.

## 2015-03-26 NOTE — Assessment & Plan Note (Signed)
Diuretic was stopped during hospitalization due to dehydration.  She also was started on hydralazine which can cause edema.  Discussed this with her.  She is elevating her legs with some improvement.  Has had heart block so bp med selections are limited. 12/28/13 Furosemide 10mg  daily. 12/29/14 BNP 83.2 03/23/15 noted more edema in BLE-increase Furosemide to 20mg  and Kcl to 46meq, f/u BMP in one week.

## 2015-03-26 NOTE — Assessment & Plan Note (Signed)
Stable.   Denied symptoms today. Cont protonix.

## 2015-03-26 NOTE — Assessment & Plan Note (Signed)
02/17/15 Iron 15, Fe sat 3.8% 03/23/15 add Fe 325mg  po, f/u CBC one month.

## 2015-03-26 NOTE — Assessment & Plan Note (Signed)
02/17/15 K 3.3 03/23/15 f/u BMP in one week.

## 2015-03-26 NOTE — Assessment & Plan Note (Signed)
02/17/15 Vit B12 593, continue Vit B12

## 2015-03-26 NOTE — Assessment & Plan Note (Signed)
Takes Sinemet 25/100 tid, mask facial looks, no disabling tremor noted, anxious mood at times.

## 2015-03-30 LAB — BASIC METABOLIC PANEL
BUN: 21 mg/dL (ref 4–21)
Creatinine: 0.9 mg/dL (ref 0.5–1.1)
Glucose: 86 mg/dL
Potassium: 3.5 mmol/L (ref 3.4–5.3)
Sodium: 137 mmol/L (ref 137–147)

## 2015-04-06 ENCOUNTER — Other Ambulatory Visit: Payer: Self-pay | Admitting: Nurse Practitioner

## 2015-04-06 ENCOUNTER — Encounter: Payer: Self-pay | Admitting: Nurse Practitioner

## 2015-04-12 ENCOUNTER — Other Ambulatory Visit: Payer: Self-pay

## 2015-04-12 MED ORDER — LORAZEPAM 0.5 MG PO TABS: 0.5000 mg | ORAL_TABLET | Freq: Two times a day (BID) | ORAL | Status: DC

## 2015-04-12 NOTE — Telephone Encounter (Signed)
RX sent to [facility] @ [phone], phone number [phone]. This is a nursing home refill request.   

## 2015-04-20 ENCOUNTER — Encounter: Payer: Self-pay | Admitting: Nurse Practitioner

## 2015-04-20 ENCOUNTER — Non-Acute Institutional Stay: Payer: Medicare Other | Admitting: Nurse Practitioner

## 2015-04-20 VITALS — BP 154/74 | HR 80 | Temp 98.0°F | Wt 100.0 lb

## 2015-04-20 DIAGNOSIS — R634 Abnormal weight loss: Secondary | ICD-10-CM | POA: Diagnosis not present

## 2015-04-20 DIAGNOSIS — K219 Gastro-esophageal reflux disease without esophagitis: Secondary | ICD-10-CM

## 2015-04-20 DIAGNOSIS — D509 Iron deficiency anemia, unspecified: Secondary | ICD-10-CM

## 2015-04-20 DIAGNOSIS — D518 Other vitamin B12 deficiency anemias: Secondary | ICD-10-CM

## 2015-04-20 DIAGNOSIS — M545 Low back pain: Secondary | ICD-10-CM

## 2015-04-20 DIAGNOSIS — E876 Hypokalemia: Secondary | ICD-10-CM

## 2015-04-20 DIAGNOSIS — K5900 Constipation, unspecified: Secondary | ICD-10-CM | POA: Diagnosis not present

## 2015-04-20 DIAGNOSIS — I10 Essential (primary) hypertension: Secondary | ICD-10-CM

## 2015-04-20 DIAGNOSIS — F4322 Adjustment disorder with anxiety: Secondary | ICD-10-CM | POA: Diagnosis not present

## 2015-04-20 DIAGNOSIS — M79604 Pain in right leg: Secondary | ICD-10-CM

## 2015-04-20 DIAGNOSIS — R609 Edema, unspecified: Secondary | ICD-10-CM | POA: Diagnosis not present

## 2015-04-20 DIAGNOSIS — G20 Parkinson's disease: Secondary | ICD-10-CM | POA: Diagnosis not present

## 2015-04-25 LAB — CBC AND DIFFERENTIAL
HEMATOCRIT: 35 % — AB (ref 36–46)
HEMOGLOBIN: 10.9 g/dL — AB (ref 12.0–16.0)
PLATELETS: 280 10*3/uL (ref 150–399)
WBC: 6.3 10^3/mL

## 2015-04-25 LAB — HEPATIC FUNCTION PANEL
ALK PHOS: 45 U/L (ref 25–125)
ALT: 8 U/L (ref 7–35)
AST: 8 U/L — AB (ref 13–35)
Bilirubin, Total: 0.3 mg/dL

## 2015-04-25 LAB — BASIC METABOLIC PANEL
BUN: 17 mg/dL (ref 4–21)
Creatinine: 0.8 mg/dL (ref 0.5–1.1)
Glucose: 68 mg/dL
Potassium: 3.6 mmol/L (ref 3.4–5.3)
Sodium: 138 mmol/L (ref 137–147)

## 2015-04-25 LAB — TSH: TSH: 1.46 u[IU]/mL (ref 0.41–5.90)

## 2015-04-25 NOTE — Assessment & Plan Note (Signed)
Recommended patient to uptitrate hydralazine for uncontrolled blood pressure, patient should be able to discontinue her other antihypertensive medications including Maxzide and lisinopril continue hydralazine 25 mg 3 times a day

## 2015-04-25 NOTE — Assessment & Plan Note (Signed)
Takes Sinemet 25/100 tid, mask facial looks, no disabling tremor noted, anxious mood at times.

## 2015-04-25 NOTE — Assessment & Plan Note (Signed)
02/17/15 Vit B12 593, Hgb 10.2

## 2015-04-25 NOTE — Assessment & Plan Note (Signed)
02/17/15 Iron 15, Fe sat 3.8% 03/23/15 add Fe 325mg  po, f/u CBC one month.

## 2015-04-25 NOTE — Assessment & Plan Note (Signed)
Senokot S II po qhs. Continue Colace bid and 03/06/15 prn Miralax daily. Encourage use of prn MiraLax and standing order or MOM for now.

## 2015-04-25 NOTE — Assessment & Plan Note (Signed)
More anxious and POA reported more confusion episodes associated with Lorazepam frequent use-will increase zoloft to 50mg  and decrease Lorazepam to daily prn. The patient requires assistance with her ADLs due to her impaired cognition.  -mood is improved, continue Sertraline 50mg  daily and prn Lorazepam.

## 2015-04-25 NOTE — Assessment & Plan Note (Signed)
02/17/15 K 3.3 f/u BMP in one week.

## 2015-04-25 NOTE — Assessment & Plan Note (Signed)
Diuretic was stopped during hospitalization due to dehydration.  She also was started on hydralazine which can cause edema.  Discussed this with her.  She is elevating her legs with some improvement.  Has had heart block so bp med selections are limited. 12/28/13 Furosemide 10mg  daily. 12/29/14 BNP 83.2 03/23/15 noted more edema in BLE-increase Furosemide to 20mg  and Kcl to 60meq, f/u BMP in one week.  Edema in BLE seems better. Continue Furosemide 20mg  daily.

## 2015-04-25 NOTE — Assessment & Plan Note (Signed)
Weight #100bs now(10Ibs in the past 4-5 months), the patient stated she eats and sleeps well, takes Sertraline 50mg  daily. Will update CBC, CMP and TSH to evaluate further.

## 2015-04-25 NOTE — Progress Notes (Signed)
Patient ID: Kimberly Silva, female   DOB: [birth year], 81 y.o.   MRN: [id number]   Code Status: DNR  No Known Allergies  Chief Complaint  Patient presents with  . Weight Loss    daughter concern, 03/30/15 wt 111 lbs,  Here with daughter Aldean Baker  . Abdominal Pain    low abdominal pain, just today, hasn't had BM today yet.    HPI: Patient is a 81 y.o. female seen in the clinic at [facility] today for loss of weight, constipation, and other chronic medical conditions.  Problem List Items Addressed This Visit    HYPOKALEMIA    02/17/15 K 3.3 f/u BMP in one week.        ANEMIA, B12 DEFICIENCY    02/17/15 Vit B12 593, Hgb 10.2      Essential hypertension    Recommended patient to uptitrate hydralazine for uncontrolled blood pressure, patient should be able to discontinue her other antihypertensive medications including Maxzide and lisinopril continue hydralazine 25 mg 3 times a day         Parkinsonian tremor    Takes Sinemet 25/100 tid, mask facial looks, no disabling tremor noted, anxious mood at times.          Constipation    Senokot S II po qhs. Continue Colace bid and 03/06/15 prn Miralax daily. Encourage use of prn MiraLax and standing order or MOM for now.        Loss of weight - Primary    Weight #100bs now(10Ibs in the past 4-5 months), the patient stated she eats and sleeps well, takes Sertraline 50mg  daily. Will update CBC, CMP and TSH to evaluate further.       Adjustment disorder with anxious mood    More anxious and POA reported more confusion episodes associated with Lorazepam frequent use-will increase zoloft to 50mg  and decrease Lorazepam to daily prn. The patient requires assistance with her ADLs due to her impaired cognition.  -mood is improved, continue Sertraline 50mg  daily and prn Lorazepam.         Low back pain radiating to right leg    Pain is managed with Tramadol 50mg  8am and 4pm, 25mg  12am, Tylenol 1000mg  12 noon and 8pm. May  consider taper down Tramadol use to eliminate possible cause of contusion.        GERD (gastroesophageal reflux disease)    Stable.   Denied symptoms today. Cont protonix.       Edema    Diuretic was stopped during hospitalization due to dehydration.  She also was started on hydralazine which can cause edema.  Discussed this with her.  She is elevating her legs with some improvement.  Has had heart block so bp med selections are limited. 12/28/13 Furosemide 10mg  daily. 12/29/14 BNP 83.2 03/23/15 noted more edema in BLE-increase Furosemide to 20mg  and Kcl to 78meq, f/u BMP in one week.  Edema in BLE seems better. Continue Furosemide 20mg  daily.         IDA (iron deficiency anemia)    02/17/15 Iron 15, Fe sat 3.8% 03/23/15 add Fe 325mg  po, f/u CBC one month.           Review of Systems:  Review of Systems  Constitutional: Negative for fever, chills and diaphoresis.       Generalized  HENT: Positive for hearing loss. Negative for congestion, ear discharge, ear pain, nosebleeds, sore throat and tinnitus.   Eyes: Negative for photophobia, pain, discharge and redness.  Respiratory: Negative  for cough, shortness of breath, wheezing and stridor.   Cardiovascular: Positive for leg swelling. Negative for chest pain and palpitations.  Gastrointestinal: Positive for constipation. Negative for nausea, vomiting, abdominal pain, diarrhea and blood in stool.  Endocrine: Negative for polydipsia.  Genitourinary: Positive for frequency. Negative for dysuria, urgency, hematuria and flank pain.  Musculoskeletal: Positive for back pain. Negative for myalgias and neck pain.  Skin: Negative for color change, pallor and rash.  Allergic/Immunologic: Negative for environmental allergies.  Neurological: Positive for tremors and weakness. Negative for dizziness, seizures and headaches.  Hematological: Does not bruise/bleed easily.  Psychiatric/Behavioral: The patient is nervous/anxious.       Past Medical  History  Diagnosis Date  . HYPERLIPIDEMIA 02/21/2009  . HYPOKALEMIA 08/17/2007  . ANEMIA, B12 DEFICIENCY 03/13/2009  . HYPERTENSION 05/14/2007  . Blood in stool 01/25/2008  . OSTEOARTHRITIS 05/14/2007  . OSTEOPENIA 05/14/2007    2007 hx of fosamax use   . TREMOR 07/08/2007  . FREQUENCY, URINARY 08/19/2008  . LUMBAR STRAIN 09/21/2007  . Gastric polyp     panendo  5 2009  . History of ankle fracture   . Parkinson's disease   . Adenomatous colon polyp   . Lumbar compression fracture    Past Surgical History  Procedure Laterality Date  . Abdominal hysterectomy  1974  . Fracture  2007    left ankle- Hx of fosamax use  . Dilation and curettage of uterus    . Cataract extraction, bilateral  20000  . Colonoscopy N/A 07/19/2013    Procedure: COLONOSCOPY;  Surgeon: Lafayette Dragon, MD;  Location: [facility];  Service: Endoscopy;  Laterality: N/A;  . Breast cyst aspiration     Social History:   reports that she has never smoked. She has never used smokeless tobacco. She reports that she does not drink alcohol or use illicit drugs.  Family History  Problem Relation Age of Onset  . Heart attack Mother   . Coronary artery disease Mother   . Colon cancer Sister     x 2  . Breast cancer Sister     x 4  . Parkinsonism Brother   . Peripheral vascular disease      sibling  . Stroke Father   . Colon cancer Other     nephew  . Colon cancer Paternal Aunt   . Breast cancer Sister   . Breast cancer Sister     Medications: Patient's Medications  New Prescriptions   No medications on file  Previous Medications   ACETAMINOPHEN (TYLENOL) 500 MG TABLET    Take 2 tablets (1,000 mg total) by mouth 2 (two) times daily.   ASPIRIN 81 MG TABLET    Take 81 mg by mouth daily with breakfast.    CARBIDOPA-LEVODOPA (SINEMET IR) 25-100 MG PER TABLET    Take 1 tablet by mouth 3 (three) times daily.   CHOLECALCIFEROL (VITAMIN D3) 1000 UNITS CAPS    Take 1,000 Units by mouth daily with breakfast.    CYANOCOBALAMIN  (,VITAMIN B-12,) 1000 MCG/ML INJECTION    Inject 1 mL (1,000 mcg total) into the muscle every 30 (thirty) days.   DOCUSATE SODIUM 100 MG CAPS    Take 100 mg by mouth 2 (two) times daily.   FLUTICASONE (FLONASE) 50 MCG/ACT NASAL SPRAY    Place 2 sprays into both nostrils at bedtime.   FUROSEMIDE (LASIX) 20 MG TABLET    Take 10 mg by mouth daily.   HYDRALAZINE (APRESOLINE) 25 MG TABLET  Take 1 tablet (25 mg total) by mouth every 8 (eight) hours.   LORAZEPAM (ATIVAN) 0.5 MG TABLET    Take 1 tablet (0.5 mg total) by mouth 2 (two) times daily.   MAGNESIUM 250 MG TABS    Take 250 mg by mouth daily with breakfast.    OMEPRAZOLE (PRILOSEC) 20 MG CAPSULE    Take 20 mg by mouth daily.   ONDANSETRON (ZOFRAN) 4 MG TABLET    Take 4 mg by mouth every 8 (eight) hours as needed for nausea or vomiting.   PANTOPRAZOLE (PROTONIX) 40 MG TABLET    Take 40 mg by mouth daily.   POLYETHYL GLYCOL-PROPYL GLYCOL (SYSTANE) 0.4-0.3 % SOLN    Apply 1 drop to eye 2 (two) times daily.    POLYETHYLENE GLYCOL (MIRALAX / GLYCOLAX) PACKET    Take 17 g by mouth daily as needed for mild constipation.    POTASSIUM CHLORIDE (K-DUR) 10 MEQ TABLET    Take 10 mEq by mouth. 3 tablets daily   SERTRALINE (ZOLOFT) 25 MG TABLET    Take 1 tablet (25 mg total) by mouth daily.   TRAMADOL (ULTRAM) 50 MG TABLET    Take 1 tablet (50 mg total) by mouth every 8 (eight) hours as needed for moderate pain.  Modified Medications   No medications on file  Discontinued Medications   No medications on file     Physical Exam: Physical Exam  Constitutional: She is oriented to person, place, and time. She appears well-developed and well-nourished. No distress.  HENT:  Head: Normocephalic and atraumatic.  Right Ear: External ear normal.  Left Ear: External ear normal.  Nose: Nose normal.  Mouth/Throat: Oropharynx is clear and moist. No oropharyngeal exudate.  Eyes: Conjunctivae and EOM are normal. Pupils are equal, round, and reactive to light.  Right eye exhibits no discharge. Left eye exhibits no discharge. No scleral icterus.  Neck: Normal range of motion. Neck supple. No JVD present. No tracheal deviation present. No thyromegaly present.  Cardiovascular: Normal rate, regular rhythm, normal heart sounds and intact distal pulses.  Exam reveals no friction rub.   No murmur heard. Pulmonary/Chest: Effort normal and breath sounds normal. No stridor. No respiratory distress. She has no wheezes. She has no rales. She exhibits no tenderness.  Abdominal: Soft. Bowel sounds are normal. She exhibits no distension and no mass. There is no tenderness. There is no rebound and no guarding.  Genitourinary: Guaiac positive stool.  Musculoskeletal: Normal range of motion. She exhibits edema and tenderness.  Lower back pain refers to the right thigh and knee.  1+ edema  Lymphadenopathy:    She has no cervical adenopathy.  Neurological: She is alert and oriented to person, place, and time. She has normal reflexes. No cranial nerve deficit. She exhibits normal muscle tone. Coordination abnormal.  Skin: Skin is warm and dry. No rash noted. She is not diaphoretic. No erythema. No pallor.  Psychiatric: She has a normal mood and affect. Her behavior is normal. Judgment and thought content normal.  Anxious.     Filed Vitals:   04/20/15 1552  BP: 154/74  Pulse: 80  Temp: 98 F (36.7 C)  TempSrc: Oral  Weight: 100 lb (45.36 kg)  SpO2: 94%      Labs reviewed: Basic Metabolic Panel:  Recent Labs  08/22/14 2137  11/08/14 1149  12/08/14 1421  12/11/14 0505 12/12/14 0453 12/15/14  02/17/15 02/17/15 1414 03/30/15  NA  --   < > 136  < >  --   < >  134* 132* 134*  < > 134* 134* 137  K  --   < > 3.7  < >  --   < > 3.5 3.5 4.0  < > 3.3* 3.3* 3.5  CL  --   < > 99  < >  --   < > 106 106  --   --   --  97  --   CO2  --   < > 25  < >  --   < > 24 25  --   --   --  31  --   GLUCOSE  --   < > 103*  < >  --   < > 92 98  --   --   --  113*  --   BUN  --    < > 19  < >  --   < > 14 13 13   < > 23* 23 21  CREATININE  --   < > 1.2  < >  --   < > 0.80 0.73 1.0  < > 1.1 1.06 0.9  CALCIUM  --   < > 9.3  < >  --   < > 8.7 8.4  --   --   --  10.0  --   MG 1.7  --  1.8  --  1.7  --   --   --   --   --   --   --   --   TSH  --   --   --   --  0.767  --   --   --  1.31  --   --   --   --   < > = values in this interval not displayed. Liver Function Tests:  Recent Labs  12/08/14 1036 12/09/14 0158 12/15/14  AST 11 9 8*  ALT <5 <5 8  ALKPHOS 59 51 44  BILITOT 0.5 0.3  --   PROT 6.6 5.5*  --   ALBUMIN 3.8 3.1*  --     Recent Labs  12/08/14 1036  LIPASE 27   No results for input(s): AMMONIA in the last 8760 hours. CBC:  Recent Labs  07/19/14 1438  12/08/14 1036  12/11/14 0505 12/12/14 0453 12/15/14 12/22/14 02/17/15 1414  WBC 7.6  < > 7.0  < > 6.9 6.9 6.3 5.9 8.6  NEUTROABS 5.8  --  5.5  --   --   --   --   --  6.5  HGB 12.3  < > 10.7*  < > 9.9* 9.2* 9.5* 9.6* 10.2*  HCT 38.0  < > 32.5*  < > 30.4* 28.4* 29* 30* 31.3*  MCV 86.0  < > 82.9  < > 84.0 83.8  --   --  76.5*  PLT 205  < > 269  < > 226 208 230 208 400.0  < > = values in this interval not displayed. Lipid Panel: No results for input(s): CHOL, HDL, LDLCALC, TRIG, CHOLHDL, LDLDIRECT in the last 8760 hours. Anemia Panel:  Recent Labs  02/17/15 1414  IRON 15*  VITAMINB12 593    Past Procedures:  07/20/14 MRI brain:  IMPRESSION: Unremarkable cranial MRI.  Assessment/Plan Loss of weight Weight #100bs now(10Ibs in the past 4-5 months), the patient stated she eats and sleeps well, takes Sertraline 50mg  daily. Will update CBC, CMP and TSH to evaluate further.    Constipation Senokot S II po qhs. Continue Colace bid and 03/06/15  prn Miralax daily. Encourage use of prn MiraLax and standing order or MOM for now.     Edema Diuretic was stopped during hospitalization due to dehydration.  She also was started on hydralazine which can cause edema.  Discussed this with  her.  She is elevating her legs with some improvement.  Has had heart block so bp med selections are limited. 12/28/13 Furosemide 10mg  daily. 12/29/14 BNP 83.2 03/23/15 noted more edema in BLE-increase Furosemide to 20mg  and Kcl to 67meq, f/u BMP in one week.  Edema in BLE seems better. Continue Furosemide 20mg  daily.      IDA (iron deficiency anemia) 02/17/15 Iron 15, Fe sat 3.8% 03/23/15 add Fe 325mg  po, f/u CBC one month.     Parkinsonian tremor Takes Sinemet 25/100 tid, mask facial looks, no disabling tremor noted, anxious mood at times.       Essential hypertension Recommended patient to uptitrate hydralazine for uncontrolled blood pressure, patient should be able to discontinue her other antihypertensive medications including Maxzide and lisinopril continue hydralazine 25 mg 3 times a day      GERD (gastroesophageal reflux disease) Stable.   Denied symptoms today. Cont protonix.    Low back pain radiating to right leg Pain is managed with Tramadol 50mg  8am and 4pm, 25mg  12am, Tylenol 1000mg  12 noon and 8pm. May consider taper down Tramadol use to eliminate possible cause of contusion.     ANEMIA, B12 DEFICIENCY 02/17/15 Vit B12 593, Hgb 10.2   HYPOKALEMIA 02/17/15 K 3.3 f/u BMP in one week.     Adjustment disorder with anxious mood More anxious and POA reported more confusion episodes associated with Lorazepam frequent use-will increase zoloft to 50mg  and decrease Lorazepam to daily prn. The patient requires assistance with her ADLs due to her impaired cognition.  -mood is improved, continue Sertraline 50mg  daily and prn Lorazepam.        Family/ Staff Communication: observe the patient.   Goals of Care: AL  Labs/tests ordered: CBC, CMP, TSH

## 2015-04-25 NOTE — Assessment & Plan Note (Signed)
Stable.   Denied symptoms today. Cont protonix.

## 2015-04-25 NOTE — Assessment & Plan Note (Signed)
Pain is managed with Tramadol 50mg  8am and 4pm, 25mg  12am, Tylenol 1000mg  12 noon and 8pm. May consider taper down Tramadol use to eliminate possible cause of contusion.

## 2015-04-27 ENCOUNTER — Encounter: Payer: Self-pay | Admitting: Nurse Practitioner

## 2015-04-27 ENCOUNTER — Non-Acute Institutional Stay: Payer: Medicare Other | Admitting: Nurse Practitioner

## 2015-04-27 ENCOUNTER — Encounter: Payer: Medicare Other | Admitting: Internal Medicine

## 2015-04-27 DIAGNOSIS — M545 Low back pain, unspecified: Secondary | ICD-10-CM

## 2015-04-27 DIAGNOSIS — K5900 Constipation, unspecified: Secondary | ICD-10-CM | POA: Diagnosis not present

## 2015-04-27 DIAGNOSIS — E876 Hypokalemia: Secondary | ICD-10-CM

## 2015-04-27 DIAGNOSIS — K219 Gastro-esophageal reflux disease without esophagitis: Secondary | ICD-10-CM

## 2015-04-27 DIAGNOSIS — G20 Parkinson's disease: Secondary | ICD-10-CM | POA: Diagnosis not present

## 2015-04-27 DIAGNOSIS — D509 Iron deficiency anemia, unspecified: Secondary | ICD-10-CM

## 2015-04-27 DIAGNOSIS — F4322 Adjustment disorder with anxiety: Secondary | ICD-10-CM | POA: Diagnosis not present

## 2015-04-27 DIAGNOSIS — I10 Essential (primary) hypertension: Secondary | ICD-10-CM

## 2015-04-27 DIAGNOSIS — R609 Edema, unspecified: Secondary | ICD-10-CM

## 2015-04-27 DIAGNOSIS — J069 Acute upper respiratory infection, unspecified: Secondary | ICD-10-CM | POA: Diagnosis not present

## 2015-04-27 DIAGNOSIS — G20C Parkinsonism, unspecified: Secondary | ICD-10-CM

## 2015-04-27 DIAGNOSIS — D518 Other vitamin B12 deficiency anemias: Secondary | ICD-10-CM

## 2015-04-27 DIAGNOSIS — M79604 Pain in right leg: Secondary | ICD-10-CM

## 2015-04-27 NOTE — Assessment & Plan Note (Signed)
04/24/15 CXR no evidence of acute pulmonary disease. Augmentin 875mg  bid x 7 days since 04/24/15. Improving.

## 2015-04-27 NOTE — Assessment & Plan Note (Signed)
Pain is managed with Tramadol 50mg  8am and 4pm, 25mg  12am, Tylenol 1000mg  12 noon and 8pm. May consider taper down Tramadol use to eliminate possible cause of contusion.

## 2015-04-27 NOTE — Assessment & Plan Note (Signed)
Recommended patient to uptitrate hydralazine for uncontrolled blood pressure, patient should be able to discontinue her other antihypertensive medications including Maxzide and lisinopril continue hydralazine 25 mg 3 times a day

## 2015-04-27 NOTE — Assessment & Plan Note (Signed)
02/17/15 increased K 74meq-BMP 3-4 wks. K 3.3  04/25/15 K 3.6

## 2015-04-27 NOTE — Assessment & Plan Note (Signed)
Diuretic was stopped during hospitalization due to dehydration.  She also was started on hydralazine which can cause edema.  Discussed this with her.  She is elevating her legs with some improvement.  Has had heart block so bp med selections are limited. 12/28/13 Furosemide 10mg  daily. 12/29/14 BNP 83.2 03/23/15 noted more edema in BLE-increase Furosemide to 20mg  and Kcl to 30meq, f/u BMP in one week.  Edema in BLE seems better. Continue Furosemide 20mg  daily.

## 2015-04-27 NOTE — Progress Notes (Signed)
Patient ID: Kimberly Silva, female   DOB: [birth year], 81 y.o.   MRN: [id number]   Code Status: DNR  No Known Allergies  Chief Complaint  Patient presents with  . Medical Management of Chronic Issues  . Acute Visit    cough, leukocytosis    HPI: Patient is a 81 y.o. female seen in the AL at [facility] today for fever/right sided neck swollen grands(sore) and other chronic medical conditions.  Problem List Items Addressed This Visit    HYPOKALEMIA    02/17/15 increased K 59meq-BMP 3-4 wks. K 3.3  04/25/15 K 3.6      ANEMIA, B12 DEFICIENCY    12/15/14 Hgb 9.5 12/22/14 Hgb 9.6 02/17/15 Vit B12 593, Hgb 10.2 04/27/15 Hgb 10.9      Essential hypertension    Recommended patient to uptitrate hydralazine for uncontrolled blood pressure, patient should be able to discontinue her other antihypertensive medications including Maxzide and lisinopril continue hydralazine 25 mg 3 times a day        Parkinsonian tremor    Takes Sinemet 25/100 tid, mask facial looks, no disabling tremor noted, anxious mood at times.         Constipation    Senokot S II po qhs. Continue Colace bid and 03/06/15 prn Miralax daily. Encourage use of prn MiraLax and standing order or MOM for now.        Adjustment disorder with anxious mood    More anxious and POA reported more confusion episodes associated with Lorazepam frequent use-will increase zoloft to 50mg  and decrease Lorazepam to daily prn. The patient requires assistance with her ADLs due to her impaired cognition.  -mood is improved, continue Sertraline 50mg  daily and prn Lorazepam.        Low back pain radiating to right leg    Pain is managed with Tramadol 50mg  8am and 4pm, 25mg  12am, Tylenol 1000mg  12 noon and 8pm. May consider taper down Tramadol use to eliminate possible cause of contusion.         GERD (gastroesophageal reflux disease)    Stable.   Denied symptoms today. Cont protonix.       Edema    Diuretic was stopped  during hospitalization due to dehydration.  She also was started on hydralazine which can cause edema.  Discussed this with her.  She is elevating her legs with some improvement.  Has had heart block so bp med selections are limited. 12/28/13 Furosemide 10mg  daily. 12/29/14 BNP 83.2 03/23/15 noted more edema in BLE-increase Furosemide to 20mg  and Kcl to 4meq, f/u BMP in one week.  Edema in BLE seems better. Continue Furosemide 20mg  daily.          IDA (iron deficiency anemia)    02/17/15 Iron 15, Fe sat 3.8% 03/23/15 add Fe 325mg  po, f/u CBC one month.  04/25/15 Hgb 10.9       Acute upper respiratory infection - Primary    04/24/15 CXR no evidence of acute pulmonary disease. Augmentin 875mg  bid x 7 days since 04/24/15. Improving.          Review of Systems:  Review of Systems  Constitutional: Negative for fever, chills and diaphoresis.       Generalized  HENT: Positive for hearing loss. Negative for congestion, ear discharge, ear pain, nosebleeds, sore throat and tinnitus.        C/o sore swollen glands right side of neck  Eyes: Negative for photophobia, pain, discharge and redness.  Respiratory: Negative for cough,  shortness of breath, wheezing and stridor.   Cardiovascular: Positive for leg swelling. Negative for chest pain and palpitations.  Gastrointestinal: Positive for constipation. Negative for nausea, vomiting, abdominal pain, diarrhea and blood in stool.  Endocrine: Negative for polydipsia.  Genitourinary: Positive for frequency. Negative for dysuria, urgency, hematuria and flank pain.  Musculoskeletal: Positive for back pain. Negative for myalgias and neck pain.  Skin: Negative for color change, pallor and rash.  Allergic/Immunologic: Negative for environmental allergies.  Neurological: Positive for tremors and weakness. Negative for dizziness, seizures and headaches.  Hematological: Does not bruise/bleed easily.  Psychiatric/Behavioral: The patient is nervous/anxious.        Past Medical History  Diagnosis Date  . HYPERLIPIDEMIA 02/21/2009  . HYPOKALEMIA 08/17/2007  . ANEMIA, B12 DEFICIENCY 03/13/2009  . HYPERTENSION 05/14/2007  . Blood in stool 01/25/2008  . OSTEOARTHRITIS 05/14/2007  . OSTEOPENIA 05/14/2007    2007 hx of fosamax use   . TREMOR 07/08/2007  . FREQUENCY, URINARY 08/19/2008  . LUMBAR STRAIN 09/21/2007  . Gastric polyp     panendo  5 2009  . History of ankle fracture   . Parkinson's disease   . Adenomatous colon polyp   . Lumbar compression fracture    Past Surgical History  Procedure Laterality Date  . Abdominal hysterectomy  1974  . Fracture  2007    left ankle- Hx of fosamax use  . Dilation and curettage of uterus    . Cataract extraction, bilateral  20000  . Colonoscopy N/A 07/19/2013    Procedure: COLONOSCOPY;  Surgeon: Lafayette Dragon, MD;  Location: [facility];  Service: Endoscopy;  Laterality: N/A;  . Breast cyst aspiration     Social History:   reports that she has never smoked. She has never used smokeless tobacco. She reports that she does not drink alcohol or use illicit drugs.  Family History  Problem Relation Age of Onset  . Heart attack Mother   . Coronary artery disease Mother   . Colon cancer Sister     x 2  . Breast cancer Sister     x 4  . Parkinsonism Brother   . Peripheral vascular disease      sibling  . Stroke Father   . Colon cancer Other     nephew  . Colon cancer Paternal Aunt   . Breast cancer Sister   . Breast cancer Sister     Medications: Patient's Medications  New Prescriptions   No medications on file  Previous Medications   ACETAMINOPHEN (TYLENOL) 500 MG TABLET    Take 2 tablets (1,000 mg total) by mouth 2 (two) times daily.   ASPIRIN 81 MG TABLET    Take 81 mg by mouth daily with breakfast.    CARBIDOPA-LEVODOPA (SINEMET IR) 25-100 MG PER TABLET    Take 1 tablet by mouth 3 (three) times daily.   CHOLECALCIFEROL (VITAMIN D3) 1000 UNITS CAPS    Take 1,000 Units by mouth daily with  breakfast.    CYANOCOBALAMIN (,VITAMIN B-12,) 1000 MCG/ML INJECTION    Inject 1 mL (1,000 mcg total) into the muscle every 30 (thirty) days.   DOCUSATE SODIUM 100 MG CAPS    Take 100 mg by mouth 2 (two) times daily.   FLUTICASONE (FLONASE) 50 MCG/ACT NASAL SPRAY    Place 2 sprays into both nostrils at bedtime.   FUROSEMIDE (LASIX) 20 MG TABLET    Take 10 mg by mouth daily.   HYDRALAZINE (APRESOLINE) 25 MG TABLET    Take  1 tablet (25 mg total) by mouth every 8 (eight) hours.   LORAZEPAM (ATIVAN) 0.5 MG TABLET    Take 1 tablet (0.5 mg total) by mouth 2 (two) times daily.   MAGNESIUM 250 MG TABS    Take 250 mg by mouth daily with breakfast.    OMEPRAZOLE (PRILOSEC) 20 MG CAPSULE    Take 20 mg by mouth daily.   ONDANSETRON (ZOFRAN) 4 MG TABLET    Take 4 mg by mouth every 8 (eight) hours as needed for nausea or vomiting.   PANTOPRAZOLE (PROTONIX) 40 MG TABLET    Take 40 mg by mouth daily.   POLYETHYL GLYCOL-PROPYL GLYCOL (SYSTANE) 0.4-0.3 % SOLN    Apply 1 drop to eye 2 (two) times daily.    POLYETHYLENE GLYCOL (MIRALAX / GLYCOLAX) PACKET    Take 17 g by mouth daily as needed for mild constipation.    POTASSIUM CHLORIDE (K-DUR) 10 MEQ TABLET    Take 10 mEq by mouth. 3 tablets daily   SERTRALINE (ZOLOFT) 25 MG TABLET    Take 1 tablet (25 mg total) by mouth daily.   TRAMADOL (ULTRAM) 50 MG TABLET    Take 1 tablet (50 mg total) by mouth every 8 (eight) hours as needed for moderate pain.  Modified Medications   No medications on file  Discontinued Medications   No medications on file     Physical Exam: Physical Exam  Constitutional: She is oriented to person, place, and time. She appears well-developed and well-nourished. No distress.  HENT:  Head: Normocephalic and atraumatic.  Right Ear: External ear normal.  Left Ear: External ear normal.  Nose: Nose normal.  Mouth/Throat: Oropharynx is clear and moist. No oropharyngeal exudate.  C/o sore swollen glands right side of neck-not palpated. The  patient stated it was resolved.   Eyes: Conjunctivae and EOM are normal. Pupils are equal, round, and reactive to light. Right eye exhibits no discharge. Left eye exhibits no discharge. No scleral icterus.  Neck: Normal range of motion. Neck supple. No JVD present. No tracheal deviation present. No thyromegaly present.  Cardiovascular: Normal rate, regular rhythm, normal heart sounds and intact distal pulses.  Exam reveals no friction rub.   No murmur heard. Pulmonary/Chest: Effort normal and breath sounds normal. No stridor. No respiratory distress. She has no wheezes. She has no rales. She exhibits no tenderness.  Abdominal: Soft. Bowel sounds are normal. She exhibits no distension and no mass. There is no tenderness. There is no rebound and no guarding.  Genitourinary: Guaiac positive stool.  Musculoskeletal: Normal range of motion. She exhibits edema and tenderness.  Lower back pain refers to the right thigh and knee.  1+ edema  Lymphadenopathy:    She has no cervical adenopathy.  Neurological: She is alert and oriented to person, place, and time. She has normal reflexes. No cranial nerve deficit. She exhibits normal muscle tone. Coordination abnormal.  Skin: Skin is warm and dry. No rash noted. She is not diaphoretic. No erythema. No pallor.  Psychiatric: She has a normal mood and affect. Her behavior is normal. Judgment and thought content normal.  Anxious.     Filed Vitals:   04/27/15 1627  BP: 146/80  Pulse: 76  Temp: 98.4 F (36.9 C)  TempSrc: Tympanic  Resp: 18      Labs reviewed: Basic Metabolic Panel:  Recent Labs  08/22/14 2137  11/08/14 1149  12/08/14 1421  12/11/14 0505 12/12/14 0453 12/15/14  02/17/15 1414 03/30/15 04/25/15  NA  --   < >  136  < >  --   < > 134* 132* 134*  < > 134* 137 138  K  --   < > 3.7  < >  --   < > 3.5 3.5 4.0  < > 3.3* 3.5 3.6  CL  --   < > 99  < >  --   < > 106 106  --   --  97  --   --   CO2  --   < > 25  < >  --   < > 24 25  --    --  31  --   --   GLUCOSE  --   < > 103*  < >  --   < > 92 98  --   --  113*  --   --   BUN  --   < > 19  < >  --   < > 14 13 13   < > 23 21 17   CREATININE  --   < > 1.2  < >  --   < > 0.80 0.73 1.0  < > 1.06 0.9 0.8  CALCIUM  --   < > 9.3  < >  --   < > 8.7 8.4  --   --  10.0  --   --   MG 1.7  --  1.8  --  1.7  --   --   --   --   --   --   --   --   TSH  --   --   --   --  0.767  --   --   --  1.31  --   --   --  1.46  < > = values in this interval not displayed. Liver Function Tests:  Recent Labs  12/08/14 1036 12/09/14 0158 12/15/14 04/25/15  AST 11 9 8* 8*  ALT <5 5 8 8   ALKPHOS 59 51 44 45  BILITOT 0.5 0.3  --   --   PROT 6.6 5.5*  --   --   ALBUMIN 3.8 3.1*  --   --     Recent Labs  12/08/14 1036  LIPASE 27   No results for input(s): AMMONIA in the last 8760 hours. CBC:  Recent Labs  07/19/14 1438  12/08/14 1036  12/11/14 0505 12/12/14 0453  12/22/14 02/17/15 1414 04/25/15  WBC 7.6  < > 7.0  < > 6.9 6.9  < > 5.9 8.6 6.3  NEUTROABS 5.8  --  5.5  --   --   --   --   --  6.5  --   HGB 12.3  < > 10.7*  < > 9.9* 9.2*  < > 9.6* 10.2* 10.9*  HCT 38.0  < > 32.5*  < > 30.4* 28.4*  < > 30* 31.3* 35*  MCV 86.0  < > 82.9  < > 84.0 83.8  --   --  76.5*  --   PLT 205  < > 269  < > 226 208  < > 208 400.0 280  < > = values in this interval not displayed. Lipid Panel: No results for input(s): CHOL, HDL, LDLCALC, TRIG, CHOLHDL, LDLDIRECT in the last 8760 hours. Anemia Panel:  Recent Labs  02/17/15 1414  IRON 15*  VITAMINB12 593    Past Procedures:  07/20/14 MRI brain:  IMPRESSION: Unremarkable cranial MRI.  Assessment/Plan Acute upper respiratory infection 04/24/15 CXR  no evidence of acute pulmonary disease. Augmentin 875mg  bid x 7 days since 04/24/15. Improving.    IDA (iron deficiency anemia) 02/17/15 Iron 15, Fe sat 3.8% 03/23/15 add Fe 325mg  po, f/u CBC one month.  04/25/15 Hgb 10.9    HYPOKALEMIA 02/17/15 increased K 39meq-BMP 3-4 wks. K 3.3  04/25/15 K  3.6   ANEMIA, B12 DEFICIENCY 12/15/14 Hgb 9.5 12/22/14 Hgb 9.6 02/17/15 Vit B12 593, Hgb 10.2 04/27/15 Hgb 10.9   Essential hypertension Recommended patient to uptitrate hydralazine for uncontrolled blood pressure, patient should be able to discontinue her other antihypertensive medications including Maxzide and lisinopril continue hydralazine 25 mg 3 times a day     Parkinsonian tremor Takes Sinemet 25/100 tid, mask facial looks, no disabling tremor noted, anxious mood at times.      Constipation Senokot S II po qhs. Continue Colace bid and 03/06/15 prn Miralax daily. Encourage use of prn MiraLax and standing order or MOM for now.     Adjustment disorder with anxious mood More anxious and POA reported more confusion episodes associated with Lorazepam frequent use-will increase zoloft to 50mg  and decrease Lorazepam to daily prn. The patient requires assistance with her ADLs due to her impaired cognition.  -mood is improved, continue Sertraline 50mg  daily and prn Lorazepam.     Low back pain radiating to right leg Pain is managed with Tramadol 50mg  8am and 4pm, 25mg  12am, Tylenol 1000mg  12 noon and 8pm. May consider taper down Tramadol use to eliminate possible cause of contusion.      GERD (gastroesophageal reflux disease) Stable.   Denied symptoms today. Cont protonix.    Edema Diuretic was stopped during hospitalization due to dehydration.  She also was started on hydralazine which can cause edema.  Discussed this with her.  She is elevating her legs with some improvement.  Has had heart block so bp med selections are limited. 12/28/13 Furosemide 10mg  daily. 12/29/14 BNP 83.2 03/23/15 noted more edema in BLE-increase Furosemide to 20mg  and Kcl to 9meq, f/u BMP in one week.  Edema in BLE seems better. Continue Furosemide 20mg  daily.         Family/ Staff Communication: observe the patient.   Goals of Care: AL  Labs/tests ordered: none

## 2015-04-27 NOTE — Assessment & Plan Note (Signed)
More anxious and POA reported more confusion episodes associated with Lorazepam frequent use-will increase zoloft to 50mg  and decrease Lorazepam to daily prn. The patient requires assistance with her ADLs due to her impaired cognition.  -mood is improved, continue Sertraline 50mg  daily and prn Lorazepam.

## 2015-04-27 NOTE — Assessment & Plan Note (Signed)
Takes Sinemet 25/100 tid, mask facial looks, no disabling tremor noted, anxious mood at times.

## 2015-04-27 NOTE — Assessment & Plan Note (Signed)
12/15/14 Hgb 9.5 12/22/14 Hgb 9.6 02/17/15 Vit B12 593, Hgb 10.2 04/27/15 Hgb 10.9

## 2015-04-27 NOTE — Assessment & Plan Note (Signed)
02/17/15 Iron 15, Fe sat 3.8% 03/23/15 add Fe 325mg  po, f/u CBC one month.  04/25/15 Hgb 10.9

## 2015-04-27 NOTE — Assessment & Plan Note (Signed)
Senokot S II po qhs. Continue Colace bid and 03/06/15 prn Miralax daily. Encourage use of prn MiraLax and standing order or MOM for now.

## 2015-04-27 NOTE — Assessment & Plan Note (Signed)
Stable.   Denied symptoms today. Cont protonix.

## 2015-05-19 ENCOUNTER — Encounter: Payer: Self-pay | Admitting: *Deleted

## 2015-05-19 ENCOUNTER — Telehealth: Payer: Self-pay | Admitting: *Deleted

## 2015-05-19 NOTE — Telephone Encounter (Signed)
Received Prior Authorization from [employer] #: [id number] for patient's Pantoprazole 40mg  once daily #30. Due to GERD K21.9. Filled out form and faxed back to [facility] Fax#: [phone].

## 2015-05-19 NOTE — Telephone Encounter (Signed)
Kimberly Silva, daughter called and wanted to make sure we received the form from [place]. Informed her that it has been filled out and faxed back and we would have to wait for determination. She agreed. #: [phone]

## 2015-05-25 ENCOUNTER — Telehealth: Payer: Self-pay

## 2015-05-25 NOTE — Telephone Encounter (Signed)
Received notice from [facility] that Pantoprazole has been approved effective 04/24/15 through 05/23/16.They have also let patient know that the prescription will go through.

## 2015-06-01 ENCOUNTER — Encounter: Payer: Self-pay | Admitting: Nurse Practitioner

## 2015-06-01 ENCOUNTER — Non-Acute Institutional Stay: Payer: Medicare Other | Admitting: Nurse Practitioner

## 2015-06-01 VITALS — BP 132/68 | HR 87 | Temp 97.8°F | Wt 97.0 lb

## 2015-06-01 DIAGNOSIS — F4322 Adjustment disorder with anxiety: Secondary | ICD-10-CM

## 2015-06-01 DIAGNOSIS — R531 Weakness: Secondary | ICD-10-CM | POA: Diagnosis not present

## 2015-06-01 DIAGNOSIS — K5900 Constipation, unspecified: Secondary | ICD-10-CM | POA: Diagnosis not present

## 2015-06-01 DIAGNOSIS — D518 Other vitamin B12 deficiency anemias: Secondary | ICD-10-CM

## 2015-06-01 DIAGNOSIS — D509 Iron deficiency anemia, unspecified: Secondary | ICD-10-CM

## 2015-06-01 DIAGNOSIS — R609 Edema, unspecified: Secondary | ICD-10-CM | POA: Diagnosis not present

## 2015-06-01 DIAGNOSIS — D500 Iron deficiency anemia secondary to blood loss (chronic): Secondary | ICD-10-CM

## 2015-06-01 DIAGNOSIS — G20 Parkinson's disease: Secondary | ICD-10-CM

## 2015-06-01 DIAGNOSIS — I10 Essential (primary) hypertension: Secondary | ICD-10-CM | POA: Diagnosis not present

## 2015-06-01 DIAGNOSIS — R634 Abnormal weight loss: Secondary | ICD-10-CM

## 2015-06-01 DIAGNOSIS — K219 Gastro-esophageal reflux disease without esophagitis: Secondary | ICD-10-CM

## 2015-06-01 DIAGNOSIS — R413 Other amnesia: Secondary | ICD-10-CM

## 2015-06-01 DIAGNOSIS — M79604 Pain in right leg: Secondary | ICD-10-CM

## 2015-06-01 DIAGNOSIS — M545 Low back pain: Secondary | ICD-10-CM

## 2015-06-05 ENCOUNTER — Non-Acute Institutional Stay (SKILLED_NURSING_FACILITY): Payer: Medicare Other | Admitting: Internal Medicine

## 2015-06-05 ENCOUNTER — Encounter: Payer: Self-pay | Admitting: Internal Medicine

## 2015-06-05 DIAGNOSIS — D509 Iron deficiency anemia, unspecified: Secondary | ICD-10-CM | POA: Diagnosis not present

## 2015-06-05 DIAGNOSIS — D649 Anemia, unspecified: Secondary | ICD-10-CM

## 2015-06-05 DIAGNOSIS — R413 Other amnesia: Secondary | ICD-10-CM

## 2015-06-05 DIAGNOSIS — I10 Essential (primary) hypertension: Secondary | ICD-10-CM | POA: Diagnosis not present

## 2015-06-05 DIAGNOSIS — R531 Weakness: Secondary | ICD-10-CM | POA: Diagnosis not present

## 2015-06-05 DIAGNOSIS — F4322 Adjustment disorder with anxiety: Secondary | ICD-10-CM | POA: Diagnosis not present

## 2015-06-05 DIAGNOSIS — R609 Edema, unspecified: Secondary | ICD-10-CM

## 2015-06-05 DIAGNOSIS — G20 Parkinson's disease: Secondary | ICD-10-CM | POA: Diagnosis not present

## 2015-06-05 HISTORY — DX: Other amnesia: R41.3

## 2015-06-05 NOTE — Progress Notes (Signed)
Patient ID: Kimberly Silva, female   DOB: September 04, 1926, 81 y.o.   MRN: [id number]    HISTORY AND PHYSICAL  Location:  [place] Room Number: 1 Place of Service: Nursing [phone])   Extended Emergency Contact Information Primary Emergency Contact: Hodgin,Margie Address: [place], [place] [address] Johnnette Litter of [place] Phone: [phone] Relation: Other Secondary Emergency Contact: Long,Ann Address: [place]          [place], [place] [address] Johnnette Litter of [place] Phone: [phone] Mobile Phone: [phone] Relation: Daughter  Advanced Directive information Does patient have an advance directive?: Yes, Type of Advance Directive: Living will;Out of facility DNR (pink MOST or yellow form), Does patient want to make changes to advanced directive?: No - Patient declined  Chief Complaint  Patient presents with  . New Admit To SNF    By transfer from assisted living at [facility]    HPI:  Patient was admitted 06/01/2015 by transfer from the assisted living at [facility] to the skilled nursing facility. The main reason for transfer was persistent weakness and increasing dependencies on the staff to meet activities of daily living. Patient felt so ill that she " just wanted to get it over with". Patient is now feeling better. She is eating better. Appetite has improved. She is independently walking. She remains unsteady on her feet.  Past Medical History  Diagnosis Date  . HYPERLIPIDEMIA 02/21/2009  . HYPOKALEMIA 08/17/2007  . ANEMIA, B12 DEFICIENCY 03/13/2009  . HYPERTENSION 05/14/2007  . Blood in stool 01/25/2008  . OSTEOARTHRITIS 05/14/2007  . OSTEOPENIA 05/14/2007    2007 hx of fosamax use   . TREMOR 07/08/2007  . FREQUENCY, URINARY 08/19/2008  . LUMBAR STRAIN 09/21/2007  . Gastric polyp     panendo  5 2009  . History of ankle fracture   . Parkinson's disease   . Adenomatous colon polyp     . Lumbar compression fracture   . GERD (gastroesophageal reflux disease)   . Edema   . Iron deficiency anemia   . Anxiety   . Constipation   . Hemorrhoid   . Psoriasis   . Hearing loss   . Memory deficit 06/05/2015    Past Surgical History  Procedure Laterality Date  . Abdominal hysterectomy  1974  . Fracture  2007    left ankle- Hx of fosamax use  . Dilation and curettage of uterus    . Cataract extraction, bilateral  20000  . Colonoscopy N/A 07/19/2013    Procedure: COLONOSCOPY;  Surgeon: Lafayette Dragon, MD;  Location: [facility] ENDOSCOPY;  Service: Endoscopy;  Laterality: N/A;  . Breast cyst aspiration      Patient Care Team: Estill Dooms, MD as PCP - General (Internal Medicine) Lafayette Dragon, MD (Gastroenterology) Penni Bombard, MD (Neurology) Rolm Bookbinder, MD as Attending Physician (Dermatology) Clent Jacks, MD (Ophthalmology) Belva Crome, MD as Consulting Physician (Cardiology) Evans Lance, MD as Consulting Physician (Cardiology) Reyne Dumas, MD as Consulting Physician (Internal Medicine) Suella Broad, MD as Consulting Physician (Physical Medicine and Rehabilitation) Man Mast X, NP as Nurse Practitioner (Nurse Practitioner)  History   Social History  . Marital Status: Widowed    Spouse Name: N/A  . Number of Children: 5  . Years of Education: BA   Occupational History  . Retired Environmental education officer   Social History Main  Topics  . Smoking status: Never Smoker   . Smokeless tobacco: Never Used  . Alcohol Use: No  . Drug Use: No  . Sexual Activity: Not on file   Other Topics Concern  . Not on file   Social History Narrative   Husband died in [place] in 2008-03-18 of [facility]   [id number]   Caffeine KGM:WNUUVO,  1 soda daily   Never smoked   Exercise only with Physical Therapist    DNA, POA     reports that she has never smoked. She has never used smokeless tobacco. She reports that  she does not drink alcohol or use illicit drugs.  Family History  Problem Relation Age of Onset  . Heart attack Mother   . Coronary artery disease Mother   . Colon cancer Sister     x 2  . Breast cancer Sister     x 4  . Parkinsonism Brother   . Peripheral vascular disease      sibling  . Stroke Father   . Colon cancer Other     nephew  . Colon cancer Paternal Aunt   . Breast cancer Sister   . Breast cancer Sister    Family Status  Relation Status Death Age  . Mother Deceased 55  . Father Deceased 62    Immunization History  Administered Date(s) Administered  . Influenza Split 09/26/2011, 09/29/2012  . Influenza Whole 09/21/2007, 09/27/2008, 09/12/2009, 08/17/2010  . Influenza, High Dose Seasonal PF 09/22/2013  . Influenza,inj,Quad PF,36+ Mos 10/03/2014  . Pneumococcal Conjugate-13 01/05/2014  . Pneumococcal Polysaccharide-23 12/17/1995  . Tdap 04/16/2011  . Zoster 08/27/2011    No Known Allergies  Medications: Patient's Medications  New Prescriptions   No medications on file  Previous Medications   ACETAMINOPHEN (TYLENOL) 325 MG TABLET    Take 650 mg by mouth. Take 2 every 6 hours as needed for mild pain   ACETAMINOPHEN (TYLENOL) 500 MG TABLET    Take 2 tablets (1,000 mg total) by mouth 2 (two) times daily.   ASPIRIN 81 MG TABLET    Take 81 mg by mouth daily with breakfast.    CARBIDOPA-LEVODOPA (SINEMET IR) 25-100 MG PER TABLET    Take 1 tablet by mouth 3 (three) times daily.   CHOLECALCIFEROL (VITAMIN D3) 1000 UNITS CAPS    Take 1,000 Units by mouth daily with breakfast.    DOCUSATE SODIUM (COLACE) 100 MG CAPSULE    Take 100 mg by mouth 2 (two) times daily.   FERROUS SULFATE 325 (65 FE) MG TABLET    Take 325 mg by mouth daily with breakfast.   FLUTICASONE (FLONASE) 50 MCG/ACT NASAL SPRAY    Place 2 sprays into both nostrils at bedtime.   FUROSEMIDE (LASIX) 20 MG TABLET    Take 10 mg by mouth daily.   HYDRALAZINE (APRESOLINE) 25 MG TABLET    Take 1 tablet (25  mg total) by mouth every 8 (eight) hours.   LORAZEPAM (ATIVAN) 0.5 MG TABLET    Take 1 tablet (0.5 mg total) by mouth 2 (two) times daily.   MAGNESIUM 250 MG TABS    Take 250 mg by mouth daily with breakfast.    ONDANSETRON (ZOFRAN) 4 MG TABLET    Take 4 mg by mouth every 8 (eight) hours as needed for nausea or vomiting.   PANTOPRAZOLE (PROTONIX) 40 MG TABLET    Take 40 mg by mouth daily.  POLYETHYL GLYCOL-PROPYL GLYCOL (SYSTANE) 0.4-0.3 % SOLN    Apply 1 drop to eye 2 (two) times daily.    POLYETHYLENE GLYCOL (MIRALAX / GLYCOLAX) PACKET    Take 17 g by mouth daily as needed for mild constipation.    POTASSIUM CHLORIDE (K-DUR) 10 MEQ TABLET    Take 10 mEq by mouth. 3 tablets daily   POTASSIUM CHLORIDE SA (K-DUR,KLOR-CON) 20 MEQ TABLET    Take 20 mEq by mouth. Take one table daily for potassium   SENNA (SENOKOT) 8.6 MG TABLET    Take 1 tablet by mouth. Take 2 at bedtime   TRAMADOL (ULTRAM) 50 MG TABLET    Take 1 tablet (50 mg total) by mouth every 8 (eight) hours as needed for moderate pain.  Modified Medications   No medications on file  Discontinued Medications   No medications on file    Review of Systems  Constitutional: Negative for fever, chills and diaphoresis.       Generalized weakness  HENT: Positive for hearing loss. Negative for congestion, ear discharge, ear pain, nosebleeds, sore throat and tinnitus.        C/o sore swollen glands right side of neck  Eyes: Negative for photophobia, pain, discharge and redness.  Respiratory: Negative for cough, shortness of breath, wheezing and stridor.   Cardiovascular: Positive for leg swelling. Negative for chest pain and palpitations.  Gastrointestinal: Positive for constipation. Negative for nausea, vomiting, abdominal pain, diarrhea and blood in stool.  Endocrine: Negative for polydipsia.  Genitourinary: Positive for frequency. Negative for dysuria, urgency, hematuria and flank pain.  Musculoskeletal: Positive for back pain. Negative  for myalgias and neck pain.  Skin: Negative for color change, pallor and rash.  Allergic/Immunologic: Negative for environmental allergies.  Neurological: Positive for tremors and weakness. Negative for dizziness, seizures and headaches.  Hematological: Does not bruise/bleed easily.  Psychiatric/Behavioral: The patient is nervous/anxious.     Filed Vitals:   06/05/15 1932  BP: 140/80  Pulse: 88  Temp: 98.7 F (37.1 C)  Resp: 12  Height: 4\' 11"  (1.499 m)  Weight: 97 lb (43.999 kg)   Body mass index is 19.58 kg/(m^2).  Physical Exam  Constitutional: She is oriented to person, place, and time. She appears well-developed and well-nourished. No distress.  HENT:  Head: Normocephalic and atraumatic.  Right Ear: External ear normal.  Left Ear: External ear normal.  Nose: Nose normal.  Mouth/Throat: Oropharynx is clear and moist. No oropharyngeal exudate.  Loss of hearing. Previous problems for last night seems resolved. No palpable adenopathy.  Eyes: Conjunctivae and EOM are normal. Pupils are equal, round, and reactive to light. Right eye exhibits no discharge. Left eye exhibits no discharge. No scleral icterus.  Corrective lenses  Neck: Normal range of motion. Neck supple. No JVD present. No tracheal deviation present. No thyromegaly present.  Cardiovascular: Normal rate, regular rhythm, normal heart sounds and intact distal pulses.  Exam reveals no friction rub.   No murmur heard. Mild tachycardia  Pulmonary/Chest: Effort normal and breath sounds normal. No stridor. No respiratory distress. She has no wheezes. She has no rales. She exhibits no tenderness.  Abdominal: Soft. Bowel sounds are normal. She exhibits no distension and no mass. There is no tenderness. There is no rebound and no guarding.  Genitourinary: Guaiac positive stool.  Musculoskeletal: Normal range of motion. She exhibits edema and tenderness.  Lower back pain refers to the right thigh and knee.  1+ edema. Unstable  gait. Using walker.   Lymphadenopathy:  She has no cervical adenopathy.  Neurological: She is alert and oriented to person, place, and time. She has normal reflexes. No cranial nerve deficit. She exhibits normal muscle tone. Coordination abnormal.  Bilateral tremor of the hands at rest.  Skin: Skin is warm and dry. No rash noted. She is not diaphoretic. No erythema. No pallor.  Psychiatric: She has a normal mood and affect. Her behavior is normal. Judgment and thought content normal.  Anxious.      Labs reviewed: Nursing Home on 04/27/2015  Component Date Value Ref Range Status  . Hemoglobin 04/25/2015 10.9* 12.0 - 16.0 g/dL Final  . HCT 04/25/2015 35* 36 - 46 % Final  . Platelets 04/25/2015 280  150 - 399 K/L Final  . WBC 04/25/2015 6.3   Final  . Glucose 04/25/2015 68   Final  . BUN 04/25/2015 17  4 - 21 mg/dL Final  . Creatinine 04/25/2015 0.8  0.5 - 1.1 mg/dL Final  . Potassium 04/25/2015 3.6  3.4 - 5.3 mmol/L Final  . Sodium 04/25/2015 138  137 - 147 mmol/L Final  . Alkaline Phosphatase 04/25/2015 45  25 - 125 U/L Final  . ALT 04/25/2015 8  7 - 35 U/L Final  . AST 04/25/2015 8* 13 - 35 U/L Final  . Bilirubin, Total 04/25/2015 0.3   Final  . TSH 04/25/2015 1.46  0.41 - 5.90 uIU/mL Final  Lab on 04/06/2015  Component Date Value Ref Range Status  . Glucose 03/30/2015 86   Final  . BUN 03/30/2015 21  4 - 21 mg/dL Final  . Creatinine 03/30/2015 0.9  0.5 - 1.1 mg/dL Final  . Potassium 03/30/2015 3.5  3.4 - 5.3 mmol/L Final  . Sodium 03/30/2015 137  137 - 147 mmol/L Final  Nursing Home on 03/23/2015  Component Date Value Ref Range Status  . Glucose 02/17/2015 113   Final  . BUN 02/17/2015 23* 4 - 21 mg/dL Final  . Creatinine 02/17/2015 1.1  0.5 - 1.1 mg/dL Final  . Potassium 02/17/2015 3.3* 3.4 - 5.3 mmol/L Final  . Sodium 02/17/2015 134* 137 - 147 mmol/L Final    Assessment/Plan 1. Weak Generalized weakness of uncertain etiology. Patient has improved since admission to  SNF 4 days ago. She seems more comfortable and less anxious.  2. Anemia, unspecified anemia type Hemoglobin has risen to 10.9 following the initiation of iron therapy. MCV continues to be borderline at 79.6  3. Memory deficit Mild and probably contributes to her anxiety.  4. Parkinsonian tremor Stable. Does not seem to interfere with ADL.  5. IDA (iron deficiency anemia) Improving  therapy was started April 2016  6. Essential hypertension Controlled  7. Adjustment disorder with anxious mood Stable to improved  8. Edema Her edema is much same as it has been in the past. She is on hydralazine, which may aggravate this problem.   Patient is admitted to skilled nursing facility because of increasing dependency upon staff that exceeded usual compartments in the assisted living unit. She has improved since admission to skilled nursing facility level. Prognosis is guarded in regards to potential transfer back to assisted living

## 2015-06-06 LAB — BASIC METABOLIC PANEL
BUN: 29 mg/dL — AB (ref 4–21)
Creatinine: 0.8 mg/dL (ref 0.5–1.1)
Glucose: 83 mg/dL
Potassium: 3.4 mmol/L (ref 3.4–5.3)
Sodium: 137 mmol/L (ref 137–147)

## 2015-06-06 LAB — CBC AND DIFFERENTIAL
HEMATOCRIT: 31 % — AB (ref 36–46)
Hemoglobin: 9.9 g/dL — AB (ref 12.0–16.0)
Platelets: 284 10*3/uL (ref 150–399)
WBC: 5.1 10*3/mL

## 2015-06-06 LAB — HEPATIC FUNCTION PANEL
ALK PHOS: 48 U/L (ref 25–125)
ALT: 8 U/L (ref 7–35)
AST: 9 U/L — AB (ref 13–35)

## 2015-06-06 NOTE — Assessment & Plan Note (Signed)
Stable.   Denied symptoms today. Cont protonix.

## 2015-06-06 NOTE — Assessment & Plan Note (Signed)
Multiple factorials,the patient and POA desired no aggressive diagnostic testing and treatment, will have PT/OT to eval and treated as indicated.

## 2015-06-06 NOTE — Assessment & Plan Note (Signed)
12/15/14 Hgb 9.5 12/22/14 Hgb 9.6 02/17/15 Vit B12 593, Hgb 10.2 04/27/15 Hgb 10.9 Taking Fe, will update CBC

## 2015-06-06 NOTE — Assessment & Plan Note (Addendum)
02/17/15 Iron 15, Fe sat 3.8% 03/23/15 add Fe 325mg  po, f/u CBC one month.  04/25/15 Hgb 10.9 --update CBC, dc ASA, Tylenol, Vit D, Mg R>B

## 2015-06-06 NOTE — Assessment & Plan Note (Signed)
Pain is managed with Tramadol and Tylenol.

## 2015-06-06 NOTE — Assessment & Plan Note (Signed)
Stable

## 2015-06-06 NOTE — Assessment & Plan Note (Signed)
Senokot S II po qhs. Continue Colace bid and 03/06/15 prn Miralax daily. Encourage use of prn MiraLax and standing order or MOM for now.

## 2015-06-06 NOTE — Progress Notes (Signed)
Patient ID: Kimberly Silva, female   DOB: 10-07-1926, 81 y.o.   MRN: [id number]   Code Status: DNR  No Known Allergies  Chief Complaint  Patient presents with  . Medical Management of Chronic Issues    weight loss.  weight 04/27/15 100; 03/23/15 111,  02/23/15 108,  02/17/15 110,  12/23/14 115, 12/08/14 113, 11/08/14 120.  Here with daughter Lesleigh Noe    HPI: Patient is a 81 y.o. female seen in the clinic at [facility] today for chronic medical conditions.  Problem List Items Addressed This Visit    ANEMIA, B12 DEFICIENCY    Stable      Relevant Medications   ferrous sulfate 325 (65 FE) MG tablet   Essential hypertension    Recommended patient to uptitrate hydralazine for uncontrolled blood pressure, patient should be able to discontinue her other antihypertensive medications including Maxzide and lisinopril continue hydralazine 25 mg 3 times a day        Parkinsonian tremor    Takes Sinemet 25/100 tid, mask facial looks, no disabling tremor noted, anxious mood at times.          Constipation    Senokot S II po qhs. Continue Colace bid and 03/06/15 prn Miralax daily. Encourage use of prn MiraLax and standing order or MOM for now.         Loss of weight    Gradual and persisted, supportive care, no aggressive workups      Adjustment disorder with anxious mood    More anxious and POA reported more confusion episodes associated with Lorazepam frequent use-will increase zoloft to 50mg  and decrease Lorazepam to daily prn. The patient requires assistance with her ADLs due to her impaired cognition.  -mood is improved, continue Sertraline 50mg  daily and prn Lorazepam.         Low back pain radiating to right leg    Pain is managed with Tramadol and Tylenol.          Relevant Medications   acetaminophen (TYLENOL) 325 MG tablet   GERD (gastroesophageal reflux disease)    Stable.   Denied symptoms today. Cont protonix.        Relevant Medications   docusate  sodium (COLACE) 100 MG capsule   senna (SENOKOT) 8.6 MG tablet   Edema    Diuretic was stopped during hospitalization due to dehydration.  She also was started on hydralazine which can cause edema.  Discussed this with her.  She is elevating her legs with some improvement.  Has had heart block so bp med selections are limited. 12/28/13 Furosemide 10mg  daily. 12/29/14 BNP 83.2 03/23/15 noted more edema in BLE-increase Furosemide to 20mg  and Kcl to 39meq, f/u BMP in one week.  Edema in BLE seems better. Continue Furosemide 20mg  daily.         IDA (iron deficiency anemia)    02/17/15 Iron 15, Fe sat 3.8% 03/23/15 add Fe 325mg  po, f/u CBC one month.  04/25/15 Hgb 10.9 --update CBC, dc ASA, Tylenol, Vit D, Mg R>B         Relevant Medications   ferrous sulfate 325 (65 FE) MG tablet   Anemia    12/15/14 Hgb 9.5 12/22/14 Hgb 9.6 02/17/15 Vit B12 593, Hgb 10.2 04/27/15 Hgb 10.9 Taking Fe, will update CBC      Relevant Medications   ferrous sulfate 325 (65 FE) MG tablet   Memory deficit    02/21/15  MMSE 25/30 SNF for care needs.  Generalized weakness - Primary    Multiple factorials,the patient and POA desired no aggressive diagnostic testing and treatment, will have PT/OT to eval and treated as indicated.          Review of Systems:  Review of Systems  Constitutional: Positive for unexpected weight change. Negative for fever, chills and diaphoresis.       Generalized weakness and gradual weight loss  HENT: Positive for hearing loss. Negative for congestion, ear discharge, ear pain, nosebleeds, sore throat and tinnitus.        C/o sore swollen glands right side of neck  Eyes: Negative for photophobia, pain, discharge and redness.  Respiratory: Negative for cough, shortness of breath, wheezing and stridor.   Cardiovascular: Positive for leg swelling. Negative for chest pain and palpitations.  Gastrointestinal: Positive for constipation. Negative for nausea, vomiting, abdominal pain,  diarrhea and blood in stool.  Endocrine: Negative for polydipsia.  Genitourinary: Positive for frequency. Negative for dysuria, urgency, hematuria and flank pain.  Musculoskeletal: Positive for back pain. Negative for myalgias and neck pain.  Skin: Negative for color change, pallor and rash.  Allergic/Immunologic: Negative for environmental allergies.  Neurological: Positive for tremors and weakness. Negative for dizziness, seizures and headaches.  Hematological: Does not bruise/bleed easily.  Psychiatric/Behavioral: The patient is nervous/anxious.       Past Medical History  Diagnosis Date  . HYPERLIPIDEMIA 02/21/2009  . HYPOKALEMIA 08/17/2007  . ANEMIA, B12 DEFICIENCY 03/13/2009  . HYPERTENSION 05/14/2007  . Blood in stool 01/25/2008  . OSTEOARTHRITIS 05/14/2007  . OSTEOPENIA 05/14/2007    2007 hx of fosamax use   . TREMOR 07/08/2007  . FREQUENCY, URINARY 08/19/2008  . LUMBAR STRAIN 09/21/2007  . Gastric polyp     panendo  5 2009  . History of ankle fracture   . Parkinson's disease   . Adenomatous colon polyp   . Lumbar compression fracture   . GERD (gastroesophageal reflux disease)   . Edema   . Iron deficiency anemia   . Anxiety   . Constipation   . Hemorrhoid   . Psoriasis   . Hearing loss   . Memory deficit 06/05/2015   Past Surgical History  Procedure Laterality Date  . Abdominal hysterectomy  1974  . Fracture  2007    left ankle- Hx of fosamax use  . Dilation and curettage of uterus    . Cataract extraction, bilateral  20000  . Colonoscopy N/A 07/19/2013    Procedure: COLONOSCOPY;  Surgeon: Lafayette Dragon, MD;  Location: [facility];  Service: Endoscopy;  Laterality: N/A;  . Breast cyst aspiration     Social History:   reports that she has never smoked. She has never used smokeless tobacco. She reports that she does not drink alcohol or use illicit drugs.  Family History  Problem Relation Age of Onset  . Heart attack Mother   . Coronary artery disease Mother   .  Colon cancer Sister     x 2  . Breast cancer Sister     x 4  . Parkinsonism Brother   . Peripheral vascular disease      sibling  . Stroke Father   . Colon cancer Other     nephew  . Colon cancer Paternal Aunt   . Breast cancer Sister   . Breast cancer Sister     Medications: Patient's Medications  New Prescriptions   No medications on file  Previous Medications   ACETAMINOPHEN (TYLENOL) 325 MG TABLET    Take  650 mg by mouth. Take 2 every 6 hours as needed for mild pain   ACETAMINOPHEN (TYLENOL) 500 MG TABLET    Take 2 tablets (1,000 mg total) by mouth 2 (two) times daily.   ASPIRIN 81 MG TABLET    Take 81 mg by mouth daily with breakfast.    CARBIDOPA-LEVODOPA (SINEMET IR) 25-100 MG PER TABLET    Take 1 tablet by mouth 3 (three) times daily.   CHOLECALCIFEROL (VITAMIN D3) 1000 UNITS CAPS    Take 1,000 Units by mouth daily with breakfast.    DOCUSATE SODIUM (COLACE) 100 MG CAPSULE    Take 100 mg by mouth 2 (two) times daily.   FERROUS SULFATE 325 (65 FE) MG TABLET    Take 325 mg by mouth daily with breakfast.   FLUTICASONE (FLONASE) 50 MCG/ACT NASAL SPRAY    Place 2 sprays into both nostrils at bedtime.   FUROSEMIDE (LASIX) 20 MG TABLET    Take 10 mg by mouth daily.   HYDRALAZINE (APRESOLINE) 25 MG TABLET    Take 1 tablet (25 mg total) by mouth every 8 (eight) hours.   LORAZEPAM (ATIVAN) 0.5 MG TABLET    Take 1 tablet (0.5 mg total) by mouth 2 (two) times daily.   MAGNESIUM 250 MG TABS    Take 250 mg by mouth daily with breakfast.    ONDANSETRON (ZOFRAN) 4 MG TABLET    Take 4 mg by mouth every 8 (eight) hours as needed for nausea or vomiting.   PANTOPRAZOLE (PROTONIX) 40 MG TABLET    Take 40 mg by mouth daily.   POLYETHYL GLYCOL-PROPYL GLYCOL (SYSTANE) 0.4-0.3 % SOLN    Apply 1 drop to eye 2 (two) times daily.    POLYETHYLENE GLYCOL (MIRALAX / GLYCOLAX) PACKET    Take 17 g by mouth daily as needed for mild constipation.    POTASSIUM CHLORIDE (K-DUR) 10 MEQ TABLET    Take 10 mEq by  mouth. 3 tablets daily   POTASSIUM CHLORIDE SA (K-DUR,KLOR-CON) 20 MEQ TABLET    Take 20 mEq by mouth. Take one table daily for potassium   SENNA (SENOKOT) 8.6 MG TABLET    Take 1 tablet by mouth. Take 2 at bedtime   TRAMADOL (ULTRAM) 50 MG TABLET    Take 1 tablet (50 mg total) by mouth every 8 (eight) hours as needed for moderate pain.  Modified Medications   No medications on file  Discontinued Medications   CYANOCOBALAMIN (,VITAMIN B-12,) 1000 MCG/ML INJECTION    Inject 1 mL (1,000 mcg total) into the muscle every 30 (thirty) days.   DOCUSATE SODIUM 100 MG CAPS    Take 100 mg by mouth 2 (two) times daily.   OMEPRAZOLE (PRILOSEC) 20 MG CAPSULE    Take 20 mg by mouth daily.   POTASSIUM CHLORIDE SA (K-DUR,KLOR-CON) 20 MEQ TABLET    Take one tablet daily   SERTRALINE (ZOLOFT) 25 MG TABLET    Take 1 tablet (25 mg total) by mouth daily.     Physical Exam: Physical Exam  Constitutional: She is oriented to person, place, and time. She appears well-developed and well-nourished. No distress.  HENT:  Head: Normocephalic and atraumatic.  Right Ear: External ear normal.  Left Ear: External ear normal.  Nose: Nose normal.  Mouth/Throat: Oropharynx is clear and moist. No oropharyngeal exudate.  C/o sore swollen glands right side of neck-not palpated. The patient stated it was resolved.   Eyes: Conjunctivae and EOM are normal. Pupils are equal, round, and  reactive to light. Right eye exhibits no discharge. Left eye exhibits no discharge. No scleral icterus.  Neck: Normal range of motion. Neck supple. No JVD present. No tracheal deviation present. No thyromegaly present.  Cardiovascular: Normal rate, regular rhythm, normal heart sounds and intact distal pulses.  Exam reveals no friction rub.   No murmur heard. Pulmonary/Chest: Effort normal and breath sounds normal. No stridor. No respiratory distress. She has no wheezes. She has no rales. She exhibits no tenderness.  Abdominal: Soft. Bowel sounds  are normal. She exhibits no distension and no mass. There is no tenderness. There is no rebound and no guarding.  Genitourinary: Guaiac positive stool.  Musculoskeletal: Normal range of motion. She exhibits edema and tenderness.  Lower back pain refers to the right thigh and knee.  1+ edema  Lymphadenopathy:    She has no cervical adenopathy.  Neurological: She is alert and oriented to person, place, and time. She has normal reflexes. No cranial nerve deficit. She exhibits normal muscle tone. Coordination abnormal.  Skin: Skin is warm and dry. No rash noted. She is not diaphoretic. No erythema. No pallor.  Psychiatric: She has a normal mood and affect. Her behavior is normal. Judgment and thought content normal.  Anxious.     Filed Vitals:   06/01/15 1605  BP: 132/68  Pulse: 87  Temp: 97.8 F (36.6 C)  TempSrc: Oral  Weight: 97 lb (43.999 kg)  SpO2: 99%      Labs reviewed: Basic Metabolic Panel:  Recent Labs  08/22/14 2137  11/08/14 1149  12/08/14 1421  12/11/14 0505 12/12/14 0453 12/15/14  02/17/15 1414 03/30/15 04/25/15  NA  --   < > 136  < >  --   < > 134* 132* 134*  < > 134* 137 138  K  --   < > 3.7  < >  --   < > 3.5 3.5 4.0  < > 3.3* 3.5 3.6  CL  --   < > 99  < >  --   < > 106 106  --   --  97  --   --   CO2  --   < > 25  < >  --   < > 24 25  --   --  31  --   --   GLUCOSE  --   < > 103*  < >  --   < > 92 98  --   --  113*  --   --   BUN  --   < > 19  < >  --   < > 14 13 13   < > 23 21 17   CREATININE  --   < > 1.2  < >  --   < > 0.80 0.73 1.0  < > 1.06 0.9 0.8  CALCIUM  --   < > 9.3  < >  --   < > 8.7 8.4  --   --  10.0  --   --   MG 1.7  --  1.8  --  1.7  --   --   --   --   --   --   --   --   TSH  --   --   --   --  0.767  --   --   --  1.31  --   --   --  1.46  < > = values in this interval not displayed.  Liver Function Tests:  Recent Labs  12/08/14 1036 12/09/14 0158 12/15/14 04/25/15  AST 11 9 8* 8*  ALT <5 5 8 8   ALKPHOS 59 51 44 45  BILITOT 0.5  0.3  --   --   PROT 6.6 5.5*  --   --   ALBUMIN 3.8 3.1*  --   --     Recent Labs  12/08/14 1036  LIPASE 27   No results for input(s): AMMONIA in the last 8760 hours. CBC:  Recent Labs  07/19/14 1438  12/08/14 1036  12/11/14 0505 12/12/14 0453  12/22/14 02/17/15 1414 04/25/15  WBC 7.6  < > 7.0  < > 6.9 6.9  < > 5.9 8.6 6.3  NEUTROABS 5.8  --  5.5  --   --   --   --   --  6.5  --   HGB 12.3  < > 10.7*  < > 9.9* 9.2*  < > 9.6* 10.2* 10.9*  HCT 38.0  < > 32.5*  < > 30.4* 28.4*  < > 30* 31.3* 35*  MCV 86.0  < > 82.9  < > 84.0 83.8  --   --  76.5*  --   PLT 205  < > 269  < > 226 208  < > 208 400.0 280  < > = values in this interval not displayed. Lipid Panel: No results for input(s): CHOL, HDL, LDLCALC, TRIG, CHOLHDL, LDLDIRECT in the last 8760 hours. Anemia Panel:  Recent Labs  02/17/15 1414  IRON 15*  VITAMINB12 593    Past Procedures:  07/20/14 MRI brain:  IMPRESSION: Unremarkable cranial MRI.  Assessment/Plan Generalized weakness Multiple factorials,the patient and POA desired no aggressive diagnostic testing and treatment, will have PT/OT to eval and treated as indicated.   Anemia 12/15/14 Hgb 9.5 12/22/14 Hgb 9.6 02/17/15 Vit B12 593, Hgb 10.2 04/27/15 Hgb 10.9 Taking Fe, will update CBC  Memory deficit 02/21/15  MMSE 25/30 SNF for care needs.    IDA (iron deficiency anemia) 02/17/15 Iron 15, Fe sat 3.8% 03/23/15 add Fe 325mg  po, f/u CBC one month.  04/25/15 Hgb 10.9 --update CBC, dc ASA, Tylenol, Vit D, Mg R>B     Parkinsonian tremor Takes Sinemet 25/100 tid, mask facial looks, no disabling tremor noted, anxious mood at times.      Constipation Senokot S II po qhs. Continue Colace bid and 03/06/15 prn Miralax daily. Encourage use of prn MiraLax and standing order or MOM for now.     GERD (gastroesophageal reflux disease) Stable.   Denied symptoms today. Cont protonix.    Edema Diuretic was stopped during hospitalization due to dehydration.   She also was started on hydralazine which can cause edema.  Discussed this with her.  She is elevating her legs with some improvement.  Has had heart block so bp med selections are limited. 12/28/13 Furosemide 10mg  daily. 12/29/14 BNP 83.2 03/23/15 noted more edema in BLE-increase Furosemide to 20mg  and Kcl to 38meq, f/u BMP in one week.  Edema in BLE seems better. Continue Furosemide 20mg  daily.     Essential hypertension Recommended patient to uptitrate hydralazine for uncontrolled blood pressure, patient should be able to discontinue her other antihypertensive medications including Maxzide and lisinopril continue hydralazine 25 mg 3 times a day    ANEMIA, B12 DEFICIENCY Stable  Adjustment disorder with anxious mood More anxious and POA reported more confusion episodes associated with Lorazepam frequent use-will increase zoloft to 50mg  and decrease Lorazepam to daily prn. The  patient requires assistance with her ADLs due to her impaired cognition.  -mood is improved, continue Sertraline 50mg  daily and prn Lorazepam.     Loss of weight Gradual and persisted, supportive care, no aggressive workups  Low back pain radiating to right leg Pain is managed with Tramadol and Tylenol.        Family/ Staff Communication: observe the patient.   Goals of Care: SNF  Labs/tests ordered: CBC and CMP

## 2015-06-06 NOTE — Assessment & Plan Note (Signed)
Takes Sinemet 25/100 tid, mask facial looks, no disabling tremor noted, anxious mood at times.

## 2015-06-06 NOTE — Assessment & Plan Note (Signed)
Diuretic was stopped during hospitalization due to dehydration.  She also was started on hydralazine which can cause edema.  Discussed this with her.  She is elevating her legs with some improvement.  Has had heart block so bp med selections are limited. 12/28/13 Furosemide 10mg  daily. 12/29/14 BNP 83.2 03/23/15 noted more edema in BLE-increase Furosemide to 20mg  and Kcl to 77meq, f/u BMP in one week.  Edema in BLE seems better. Continue Furosemide 20mg  daily.

## 2015-06-06 NOTE — Assessment & Plan Note (Signed)
Recommended patient to uptitrate hydralazine for uncontrolled blood pressure, patient should be able to discontinue her other antihypertensive medications including Maxzide and lisinopril continue hydralazine 25 mg 3 times a day

## 2015-06-06 NOTE — Assessment & Plan Note (Signed)
02/21/15  MMSE 25/30 SNF for care needs.

## 2015-06-06 NOTE — Assessment & Plan Note (Signed)
More anxious and POA reported more confusion episodes associated with Lorazepam frequent use-will increase zoloft to 50mg  and decrease Lorazepam to daily prn. The patient requires assistance with her ADLs due to her impaired cognition.  -mood is improved, continue Sertraline 50mg  daily and prn Lorazepam.

## 2015-06-06 NOTE — Assessment & Plan Note (Signed)
Gradual and persisted, supportive care, no aggressive workups

## 2015-06-07 ENCOUNTER — Other Ambulatory Visit: Payer: Self-pay | Admitting: Nurse Practitioner

## 2015-06-20 LAB — BASIC METABOLIC PANEL
BUN: 20 mg/dL (ref 4–21)
Creatinine: 0.8 mg/dL (ref <=1.1)
Glucose: 87 mg/dL
Potassium: 3.8 mmol/L (ref 3.4–5.3)
Sodium: 138 mmol/L (ref 137–147)

## 2015-06-20 LAB — CBC AND DIFFERENTIAL
HCT: 32 % — AB (ref 36–46)
HEMOGLOBIN: 10.2 g/dL — AB (ref 12.0–16.0)
Platelets: 279 10*3/uL (ref 150–399)
WBC: 5.6 10^3/mL

## 2015-06-21 ENCOUNTER — Other Ambulatory Visit: Payer: Self-pay | Admitting: Nurse Practitioner

## 2015-06-21 DIAGNOSIS — D518 Other vitamin B12 deficiency anemias: Secondary | ICD-10-CM

## 2015-07-14 ENCOUNTER — Non-Acute Institutional Stay (SKILLED_NURSING_FACILITY): Payer: Medicare Other | Admitting: Nurse Practitioner

## 2015-07-14 ENCOUNTER — Encounter: Payer: Self-pay | Admitting: Nurse Practitioner

## 2015-07-14 DIAGNOSIS — K5900 Constipation, unspecified: Secondary | ICD-10-CM | POA: Diagnosis not present

## 2015-07-14 DIAGNOSIS — D518 Other vitamin B12 deficiency anemias: Secondary | ICD-10-CM

## 2015-07-14 DIAGNOSIS — R634 Abnormal weight loss: Secondary | ICD-10-CM | POA: Diagnosis not present

## 2015-07-14 DIAGNOSIS — D509 Iron deficiency anemia, unspecified: Secondary | ICD-10-CM | POA: Diagnosis not present

## 2015-07-14 DIAGNOSIS — R413 Other amnesia: Secondary | ICD-10-CM

## 2015-07-14 DIAGNOSIS — K219 Gastro-esophageal reflux disease without esophagitis: Secondary | ICD-10-CM | POA: Diagnosis not present

## 2015-07-14 DIAGNOSIS — M79604 Pain in right leg: Secondary | ICD-10-CM

## 2015-07-14 DIAGNOSIS — I10 Essential (primary) hypertension: Secondary | ICD-10-CM | POA: Diagnosis not present

## 2015-07-14 DIAGNOSIS — M545 Low back pain, unspecified: Secondary | ICD-10-CM

## 2015-07-14 DIAGNOSIS — R609 Edema, unspecified: Secondary | ICD-10-CM

## 2015-07-14 DIAGNOSIS — G20 Parkinson's disease: Secondary | ICD-10-CM

## 2015-07-14 DIAGNOSIS — R531 Weakness: Secondary | ICD-10-CM

## 2015-07-14 DIAGNOSIS — G20C Parkinsonism, unspecified: Secondary | ICD-10-CM

## 2015-07-14 DIAGNOSIS — F4322 Adjustment disorder with anxiety: Secondary | ICD-10-CM | POA: Diagnosis not present

## 2015-07-14 NOTE — Assessment & Plan Note (Signed)
Stable, continue PPI

## 2015-07-14 NOTE — Assessment & Plan Note (Signed)
Takes Sinemet 25/100 tid, noted mask facial looks, no disabling tremor, mild anxious mood at times.

## 2015-07-14 NOTE — Progress Notes (Signed)
Patient ID: Kimberly Silva, female   DOB: 1926/01/18, 81 y.o.   MRN: [id number]  Location:  SNF [facility] Provider:  Marlana Latus NP  Code Status:  DNR Goals of care: Advanced Directive information    Chief Complaint  Patient presents with  . Medical Management of Chronic Issues  . Acute Visit    lower leg edema     HPI: Patient is a 81 y.o. female seen in the SNF at [facility] today for evaluation of c/o lower leg edema. Staff reported the patient has increased lower edema especailly in the LLE, also staff reported weeping wound in her LLE requires dressing change 3x 06/26/15 which is healing nicely upon today's visit 06/29/15. Her weights has been stable # 104Ibs. The patient denied increased SOB, orthopnea, cough, phlegm production, or palpitation. She has been treated for anemia, last Hgb 10.2, takes Vit B12 and Fe, stopped ASA since the last episode of tarry stool. Blood pressure is well controlled with mild elevated SBP in 150s. Hx of Parkinson's dx, takes Sinemet, she is not disabling. Other treated medical conditions include stable constipation, anxiety, depression, lower back pain, and GERD  Review of Systems:  Review of Systems  Constitutional: Negative for fever, chills and diaphoresis.       Generalized weakness and gradual weight loss  HENT: Positive for hearing loss. Negative for congestion, ear discharge, ear pain, nosebleeds, sore throat and tinnitus.        C/o sore swollen glands right side of neck  Eyes: Negative for photophobia, pain, discharge and redness.  Respiratory: Negative for cough, shortness of breath, wheezing and stridor.   Cardiovascular: Positive for leg swelling. Negative for chest pain and palpitations.  Gastrointestinal: Positive for constipation. Negative for nausea, vomiting, abdominal pain, diarrhea and blood in stool.  Genitourinary: Positive for frequency. Negative for dysuria, urgency, hematuria and flank pain.  Musculoskeletal: Positive for  back pain. Negative for myalgias and neck pain.  Skin: Negative for rash.  Neurological: Positive for tremors and weakness. Negative for dizziness, seizures and headaches.  Endo/Heme/Allergies: Negative for environmental allergies and polydipsia. Does not bruise/bleed easily.  Psychiatric/Behavioral: The patient is nervous/anxious.     Past Medical History  Diagnosis Date  . HYPERLIPIDEMIA 02/21/2009  . HYPOKALEMIA 08/17/2007  . ANEMIA, B12 DEFICIENCY 03/13/2009  . HYPERTENSION 05/14/2007  . Blood in stool 01/25/2008  . OSTEOARTHRITIS 05/14/2007  . OSTEOPENIA 05/14/2007    2007 hx of fosamax use   . TREMOR 07/08/2007  . FREQUENCY, URINARY 08/19/2008  . LUMBAR STRAIN 09/21/2007  . Gastric polyp     panendo  5 2009  . History of ankle fracture   . Parkinson's disease   . Adenomatous colon polyp   . Lumbar compression fracture   . GERD (gastroesophageal reflux disease)   . Edema   . Iron deficiency anemia   . Anxiety   . Constipation   . Hemorrhoid   . Psoriasis   . Hearing loss   . Memory deficit 06/05/2015    Patient Active Problem List   Diagnosis Date Noted  . Memory deficit 06/05/2015  . Generalized weakness 06/05/2015  . Anemia   . IDA (iron deficiency anemia) 03/26/2015  . Edema 12/22/2014  . Compression fracture of L3 lumbar vertebra 12/14/2014  . GERD (gastroesophageal reflux disease) 12/14/2014  . Low back pain radiating to right leg 11/08/2014  . Complete heart block 08/22/2014  . Syncope 07/19/2014  . Adjustment disorder with anxious mood 05/13/2014  . Left leg  pain 11/01/2013  . Benign neoplasm of colon 07/19/2013  . Hemorrhage of rectum and anus 07/19/2013  . Loss of weight 07/13/2013  . Chronic rhinosinusitis 02/03/2013  . Constipation 08/04/2012  . Parkinsonian tremor 07/05/2012  . Decreased hearing 04/17/2011  . Family history of cancer 04/17/2011  . ANEMIA, B12 DEFICIENCY 03/13/2009  . HYPERLIPIDEMIA 02/21/2009  . FREQUENCY, URINARY 08/19/2008  . Blood in  stool 01/25/2008  . TREMOR 07/08/2007  . Essential hypertension 05/14/2007  . OSTEOARTHRITIS 05/14/2007  . OSTEOPENIA 05/14/2007    No Known Allergies  Medications: Patient's Medications  New Prescriptions   No medications on file  Previous Medications   ACETAMINOPHEN (TYLENOL) 325 MG TABLET    Take 650 mg by mouth. Take 2 every 6 hours as needed for mild pain   ACETAMINOPHEN (TYLENOL) 500 MG TABLET    Take 2 tablets (1,000 mg total) by mouth 2 (two) times daily.   ASPIRIN 81 MG TABLET    Take 81 mg by mouth daily with breakfast.    CARBIDOPA-LEVODOPA (SINEMET IR) 25-100 MG PER TABLET    Take 1 tablet by mouth 3 (three) times daily.   CHOLECALCIFEROL (VITAMIN D3) 1000 UNITS CAPS    Take 1,000 Units by mouth daily with breakfast.    DOCUSATE SODIUM (COLACE) 100 MG CAPSULE    Take 100 mg by mouth 2 (two) times daily.   FERROUS SULFATE 325 (65 FE) MG TABLET    Take 325 mg by mouth daily with breakfast.   FLUTICASONE (FLONASE) 50 MCG/ACT NASAL SPRAY    Place 2 sprays into both nostrils at bedtime.   FUROSEMIDE (LASIX) 20 MG TABLET    Take 10 mg by mouth daily.   HYDRALAZINE (APRESOLINE) 25 MG TABLET    Take 1 tablet (25 mg total) by mouth every 8 (eight) hours.   LORAZEPAM (ATIVAN) 0.5 MG TABLET    Take 1 tablet (0.5 mg total) by mouth 2 (two) times daily.   MAGNESIUM 250 MG TABS    Take 250 mg by mouth daily with breakfast.    ONDANSETRON (ZOFRAN) 4 MG TABLET    Take 4 mg by mouth every 8 (eight) hours as needed for nausea or vomiting.   PANTOPRAZOLE (PROTONIX) 40 MG TABLET    Take 40 mg by mouth daily.   POLYETHYL GLYCOL-PROPYL GLYCOL (SYSTANE) 0.4-0.3 % SOLN    Apply 1 drop to eye 2 (two) times daily.    POLYETHYLENE GLYCOL (MIRALAX / GLYCOLAX) PACKET    Take 17 g by mouth daily as needed for mild constipation.    POTASSIUM CHLORIDE (K-DUR) 10 MEQ TABLET    Take 10 mEq by mouth. 3 tablets daily   POTASSIUM CHLORIDE SA (K-DUR,KLOR-CON) 20 MEQ TABLET    Take 20 mEq by mouth. Take one  table daily for potassium   SENNA (SENOKOT) 8.6 MG TABLET    Take 1 tablet by mouth. Take 2 at bedtime   TRAMADOL (ULTRAM) 50 MG TABLET    Take 1 tablet (50 mg total) by mouth every 8 (eight) hours as needed for moderate pain.  Modified Medications   No medications on file  Discontinued Medications   No medications on file    Physical Exam: Filed Vitals:   06/29/15 1118  BP: 134/80  Pulse: 74  Temp: 97.3 F (36.3 C)  TempSrc: Tympanic  Resp: 16   There is no weight on file to calculate BMI.  Physical Exam  Constitutional: She is oriented to person, place, and time. She  appears well-developed and well-nourished. No distress.  HENT:  Head: Normocephalic and atraumatic.  Right Ear: External ear normal.  Left Ear: External ear normal.  Nose: Nose normal.  Mouth/Throat: Oropharynx is clear and moist. No oropharyngeal exudate.  C/o sore swollen glands right side of neck-not palpated. The patient stated it was resolved.   Eyes: Conjunctivae and EOM are normal. Pupils are equal, round, and reactive to light. Right eye exhibits no discharge. Left eye exhibits no discharge. No scleral icterus.  Neck: Normal range of motion. Neck supple. No JVD present. No tracheal deviation present. No thyromegaly present.  Cardiovascular: Normal rate, regular rhythm, normal heart sounds and intact distal pulses.  Exam reveals no friction rub.   No murmur heard. Pulmonary/Chest: Effort normal and breath sounds normal. No stridor. No respiratory distress. She has no wheezes. She has no rales. She exhibits no tenderness.  Abdominal: Soft. Bowel sounds are normal. She exhibits no distension and no mass. There is no tenderness. There is no rebound and no guarding.  Genitourinary: Guaiac positive stool.  Musculoskeletal: Normal range of motion. She exhibits edema and tenderness.  Lower back pain refers to the right thigh and knee.  1+ edema  Lymphadenopathy:    She has no cervical adenopathy.    Neurological: She is alert and oriented to person, place, and time. She has normal reflexes. No cranial nerve deficit. She exhibits normal muscle tone. Coordination abnormal.  Skin: Skin is warm and dry. No rash noted. She is not diaphoretic. No erythema. No pallor.  Psychiatric: She has a normal mood and affect. Her behavior is normal. Judgment and thought content normal.  Anxious.     Labs reviewed: Basic Metabolic Panel:  Recent Labs  08/22/14 2137  11/08/14 1149  12/08/14 1421  12/11/14 0505 12/12/14 0453  02/17/15 1414  04/25/15 06/06/15 06/20/15  NA  --   < > 136  < >  --   < > 134* 132*  < > 134*  < > 138 137 138  K  --   < > 3.7  < >  --   < > 3.5 3.5  < > 3.3*  < > 3.6 3.4 3.8  CL  --   < > 99  < >  --   < > 106 106  --  97  --   --   --   --   CO2  --   < > 25  < >  --   < > 24 25  --  31  --   --   --   --   GLUCOSE  --   < > 103*  < >  --   < > 92 98  --  113*  --   --   --   --   BUN  --   < > 19  < >  --   < > 14 13  < > 23  < > 17 29* 20  CREATININE  --   < > 1.2  < >  --   < > 0.80 0.73  < > 1.06  < > 0.8 0.8 0.8  CALCIUM  --   < > 9.3  < >  --   < > 8.7 8.4  --  10.0  --   --   --   --   MG 1.7  --  1.8  --  1.7  --   --   --   --   --   --   --   --   --   < > =  values in this interval not displayed.  Liver Function Tests:  Recent Labs  12/08/14 1036 12/09/14 0158 12/15/14 04/25/15 06/06/15  AST 11 9 8* 8* 9*  ALT <5 5 8 8 8   ALKPHOS 59 51 44 45 48  BILITOT 0.5 0.3  --   --   --   PROT 6.6 5.5*  --   --   --   ALBUMIN 3.8 3.1*  --   --   --     CBC:  Recent Labs  07/19/14 1438  12/08/14 1036  12/11/14 0505 12/12/14 0453  02/17/15 1414 04/25/15 06/06/15 06/20/15  WBC 7.6  < > 7.0  < > 6.9 6.9  < > 8.6 6.3 5.1 5.6  NEUTROABS 5.8  --  5.5  --   --   --   --  6.5  --   --   --   HGB 12.3  < > 10.7*  < > 9.9* 9.2*  < > 10.2* 10.9* 9.9* 10.2*  HCT 38.0  < > 32.5*  < > 30.4* 28.4*  < > 31.3* 35* 31* 32*  MCV 86.0  < > 82.9  < > 84.0 83.8  --  76.5*   --   --   --   PLT 205  < > 269  < > 226 208  < > 400.0 280 284 279  < > = values in this interval not displayed.  Lab Results  Component Value Date   TSH 1.46 04/25/2015   No results found for: HGBA1C Lab Results  Component Value Date   CHOL 199 04/24/2012   HDL 69.30 04/24/2012   LDLCALC 105* 04/24/2012   LDLDIRECT 101.1 04/16/2011   TRIG 122.0 04/24/2012   CHOLHDL 3 04/24/2012    Significant Diagnostic Results since last visit: none  Patient Care Team: Estill Dooms, MD as PCP - General (Internal Medicine) Lafayette Dragon, MD (Gastroenterology) Penni Bombard, MD (Neurology) Rolm Bookbinder, MD as Attending Physician (Dermatology) Clent Jacks, MD (Ophthalmology) Belva Crome, MD as Consulting Physician (Cardiology) Evans Lance, MD as Consulting Physician (Cardiology) Reyne Dumas, MD as Consulting Physician (Internal Medicine) Suella Broad, MD as Consulting Physician (Physical Medicine and Rehabilitation) Irlene Crudup X, NP as Nurse Practitioner (Nurse Practitioner)  Assessment/Plan Problem List Items Addressed This Visit    ANEMIA, B12 DEFICIENCY    Has been stable, last Hgb 10.2(baseline Hgb 9-10), continue Vit B12       Essential hypertension    Permissive blood pressure control, SBP in 150s, continue Maxzide, lisinopril, and hydralazine        Parkinsonian tremor    Takes Sinemet 25/100 tid, noted mask facial looks, no disabling tremor, mild anxious mood at times.           Constipation    Stable, continue Senokot S II po qhs, Colace bid,  and started 03/06/15 prn MiaLax and MOM      Loss of weight    Stabilized since SNF admission, #104Ibs.       Adjustment disorder with anxious mood    Mood has been stable since increased Zoloft, admitted to SNF for care, and less Lorazepam used, continue Sertraline 50mg  daily and prn Lorazepam      Low back pain radiating to right leg    Pain is managed with Tramadol and Tylenol. Ambulates with walker for  short distance, w/c to go further.       GERD (gastroesophageal reflux disease)    Stable, continue  PPI      Edema - Primary    Weights are stable, 1+ edema LLE>RLL as usual in my eyes, will continue to monitor her weight, edema, and s/s of developing CHF      IDA (iron deficiency anemia)    02/17/15 Iron 15, Fe sat 3.8% 04/25/15 Hgb 10.9, 06/06/15 Hgb 9.9, continue FE 325mg  daily started 03/23/15      Memory deficit    02/21/15  MMSE 25/30, continue SNF for care needs.       Generalized weakness    Multiple factorials,the patient and POA desired no aggressive diagnostic testing and treatment, continue PT/OT in SNF for care needs.           Family/ staff Communication: monitor weight and lower leg edema.   Labs/tests ordered: none  ManXie Georgeanne Frankland NP [facility] [address], [place] [address] On Call:  [phone] & follow prompts after 5pm & weekends Office Phone:  [phone] Office Fax:  [phone]

## 2015-07-14 NOTE — Assessment & Plan Note (Signed)
Mood has been stable since increased Zoloft, admitted to SNF for care, and less Lorazepam used, continue Sertraline 50mg  daily and prn Lorazepam

## 2015-07-14 NOTE — Assessment & Plan Note (Signed)
02/21/15  MMSE 25/30, continue SNF for care needs.

## 2015-07-14 NOTE — Assessment & Plan Note (Signed)
Has been stable, last Hgb 10.2(baseline Hgb 9-10), continue Vit B12

## 2015-07-14 NOTE — Assessment & Plan Note (Signed)
Multiple factorials,the patient and POA desired no aggressive diagnostic testing and treatment, continue PT/OT in SNF for care needs.

## 2015-07-14 NOTE — Assessment & Plan Note (Signed)
02/17/15 Iron 15, Fe sat 3.8% 04/25/15 Hgb 10.9, 06/06/15 Hgb 9.9, continue FE 325mg  daily started 03/23/15

## 2015-07-14 NOTE — Assessment & Plan Note (Signed)
Weights are stable, 1+ edema LLE>RLL as usual in my eyes, will continue to monitor her weight, edema, and s/s of developing CHF

## 2015-07-14 NOTE — Assessment & Plan Note (Signed)
Stable, continue Senokot S II po qhs, Colace bid,  and started 03/06/15 prn MiaLax and MOM

## 2015-07-14 NOTE — Assessment & Plan Note (Signed)
Pain is managed with Tramadol and Tylenol. Ambulates with walker for short distance, w/c to go further.

## 2015-07-14 NOTE — Assessment & Plan Note (Signed)
Stabilized since SNF admission, #104Ibs.

## 2015-07-14 NOTE — Assessment & Plan Note (Signed)
Permissive blood pressure control, SBP in 150s, continue Maxzide, lisinopril, and hydralazine

## 2015-08-10 ENCOUNTER — Encounter: Payer: Self-pay | Admitting: Nurse Practitioner

## 2015-08-10 ENCOUNTER — Non-Acute Institutional Stay (SKILLED_NURSING_FACILITY): Payer: Medicare Other | Admitting: Nurse Practitioner

## 2015-08-10 DIAGNOSIS — K5900 Constipation, unspecified: Secondary | ICD-10-CM

## 2015-08-10 DIAGNOSIS — M79604 Pain in right leg: Secondary | ICD-10-CM

## 2015-08-10 DIAGNOSIS — F4322 Adjustment disorder with anxiety: Secondary | ICD-10-CM | POA: Diagnosis not present

## 2015-08-10 DIAGNOSIS — R413 Other amnesia: Secondary | ICD-10-CM | POA: Diagnosis not present

## 2015-08-10 DIAGNOSIS — M545 Low back pain: Secondary | ICD-10-CM | POA: Diagnosis not present

## 2015-08-10 DIAGNOSIS — R609 Edema, unspecified: Secondary | ICD-10-CM

## 2015-08-10 DIAGNOSIS — G20 Parkinson's disease: Secondary | ICD-10-CM | POA: Diagnosis not present

## 2015-08-10 DIAGNOSIS — I10 Essential (primary) hypertension: Secondary | ICD-10-CM

## 2015-08-10 DIAGNOSIS — D518 Other vitamin B12 deficiency anemias: Secondary | ICD-10-CM

## 2015-08-10 DIAGNOSIS — K219 Gastro-esophageal reflux disease without esophagitis: Secondary | ICD-10-CM

## 2015-08-10 DIAGNOSIS — D509 Iron deficiency anemia, unspecified: Secondary | ICD-10-CM

## 2015-08-10 DIAGNOSIS — N390 Urinary tract infection, site not specified: Secondary | ICD-10-CM | POA: Diagnosis not present

## 2015-08-10 NOTE — Assessment & Plan Note (Signed)
08/10/15 urine culture E Coli, Nitrofurantoin 100mg   Bid x 7 days

## 2015-08-14 ENCOUNTER — Encounter: Payer: Self-pay | Admitting: Nurse Practitioner

## 2015-08-14 NOTE — Assessment & Plan Note (Signed)
Permissive blood pressure control, continue Maxzide, lisinopril, and hydralazine

## 2015-08-14 NOTE — Assessment & Plan Note (Signed)
Baseline Hgb 9-10

## 2015-08-14 NOTE — Assessment & Plan Note (Signed)
Stable weights and edema.

## 2015-08-14 NOTE — Assessment & Plan Note (Signed)
Pain is controlled,  Continue Tramadol and Tylenol. Ambulates with walker for short distance, w/c to go further.

## 2015-08-14 NOTE — Assessment & Plan Note (Signed)
Mild, forgetful, continue SNF for care needs.

## 2015-08-14 NOTE — Assessment & Plan Note (Signed)
Improved since admitted to SNF, continue Sertraline 50mg  daily and prn Lorazepam

## 2015-08-14 NOTE — Assessment & Plan Note (Signed)
Stable, Hgb 10s, continue Fe 

## 2015-08-14 NOTE — Assessment & Plan Note (Signed)
Stable, continue PPI

## 2015-08-14 NOTE — Assessment & Plan Note (Signed)
Takes Sinemet 25/100 tid, flat affect,  no disabling tremor, SNF for care needs.

## 2015-08-14 NOTE — Progress Notes (Signed)
Patient ID: Kimberly Silva, female   DOB: March 15, 1926, 81 y.o.   MRN: [id number]  Location:  SNF [facility] Provider:  Marlana Latus NP  Code Status:  DNR Goals of care: Advanced Directive information    No chief complaint on file.    HPI: Patient is a 81 y.o. female seen in the SNF at [facility] today for evaluation of  Increased confusion, positive urine culture 08/10/15, denied abd pain, nausea, dysuria, and diarrhea.   Review of Systems:  Review of Systems  Constitutional: Negative for fever, chills and diaphoresis.       Generalized weakness and gradual weight loss  HENT: Positive for hearing loss. Negative for congestion, ear discharge, ear pain, nosebleeds, sore throat and tinnitus.   Eyes: Negative for photophobia, pain, discharge and redness.  Respiratory: Negative for cough, shortness of breath, wheezing and stridor.   Cardiovascular: Positive for leg swelling. Negative for chest pain and palpitations.  Gastrointestinal: Positive for constipation. Negative for nausea, vomiting, abdominal pain, diarrhea and blood in stool.  Genitourinary: Positive for frequency. Negative for dysuria, urgency, hematuria and flank pain.  Musculoskeletal: Positive for back pain. Negative for myalgias and neck pain.  Skin: Negative for rash.  Neurological: Positive for tremors and weakness. Negative for dizziness, seizures and headaches.  Endo/Heme/Allergies: Negative for environmental allergies and polydipsia. Does not bruise/bleed easily.  Psychiatric/Behavioral: Positive for memory loss. The patient is nervous/anxious.     Past Medical History  Diagnosis Date  . HYPERLIPIDEMIA 02/21/2009  . HYPOKALEMIA 08/17/2007  . ANEMIA, B12 DEFICIENCY 03/13/2009  . HYPERTENSION 05/14/2007  . Blood in stool 01/25/2008  . OSTEOARTHRITIS 05/14/2007  . OSTEOPENIA 05/14/2007    2007 hx of fosamax use   . TREMOR 07/08/2007  . FREQUENCY, URINARY 08/19/2008  . LUMBAR STRAIN 09/21/2007  . Gastric polyp     panendo   5 2009  . History of ankle fracture   . Parkinson's disease   . Adenomatous colon polyp   . Lumbar compression fracture   . GERD (gastroesophageal reflux disease)   . Edema   . Iron deficiency anemia   . Anxiety   . Constipation   . Hemorrhoid   . Psoriasis   . Hearing loss   . Memory deficit 06/05/2015    Patient Active Problem List   Diagnosis Date Noted  . UTI (urinary tract infection) 08/10/2015  . Memory deficit 06/05/2015  . Generalized weakness 06/05/2015  . Anemia   . IDA (iron deficiency anemia) 03/26/2015  . Edema 12/22/2014  . Compression fracture of L3 lumbar vertebra 12/14/2014  . GERD (gastroesophageal reflux disease) 12/14/2014  . Low back pain radiating to right leg 11/08/2014  . Complete heart block 08/22/2014  . Syncope 07/19/2014  . Adjustment disorder with anxious mood 05/13/2014  . Left leg pain 11/01/2013  . Benign neoplasm of colon 07/19/2013  . Hemorrhage of rectum and anus 07/19/2013  . Loss of weight 07/13/2013  . Chronic rhinosinusitis 02/03/2013  . Constipation 08/04/2012  . Parkinsonian tremor 07/05/2012  . Decreased hearing 04/17/2011  . Family history of cancer 04/17/2011  . ANEMIA, B12 DEFICIENCY 03/13/2009  . HYPERLIPIDEMIA 02/21/2009  . FREQUENCY, URINARY 08/19/2008  . Blood in stool 01/25/2008  . TREMOR 07/08/2007  . Essential hypertension 05/14/2007  . OSTEOARTHRITIS 05/14/2007  . OSTEOPENIA 05/14/2007    No Known Allergies  Medications: Patient's Medications  New Prescriptions   No medications on file  Previous Medications   ACETAMINOPHEN (TYLENOL) 325 MG TABLET    Take  650 mg by mouth. Take 2 every 6 hours as needed for mild pain   ACETAMINOPHEN (TYLENOL) 500 MG TABLET    Take 2 tablets (1,000 mg total) by mouth 2 (two) times daily.   ASPIRIN 81 MG TABLET    Take 81 mg by mouth daily with breakfast.    CARBIDOPA-LEVODOPA (SINEMET IR) 25-100 MG PER TABLET    Take 1 tablet by mouth 3 (three) times daily.   CHOLECALCIFEROL  (VITAMIN D3) 1000 UNITS CAPS    Take 1,000 Units by mouth daily with breakfast.    DOCUSATE SODIUM (COLACE) 100 MG CAPSULE    Take 100 mg by mouth 2 (two) times daily.   FERROUS SULFATE 325 (65 FE) MG TABLET    Take 325 mg by mouth daily with breakfast.   FLUTICASONE (FLONASE) 50 MCG/ACT NASAL SPRAY    Place 2 sprays into both nostrils at bedtime.   FUROSEMIDE (LASIX) 20 MG TABLET    Take 10 mg by mouth daily.   HYDRALAZINE (APRESOLINE) 25 MG TABLET    Take 1 tablet (25 mg total) by mouth every 8 (eight) hours.   LORAZEPAM (ATIVAN) 0.5 MG TABLET    Take 1 tablet (0.5 mg total) by mouth 2 (two) times daily.   MAGNESIUM 250 MG TABS    Take 250 mg by mouth daily with breakfast.    ONDANSETRON (ZOFRAN) 4 MG TABLET    Take 4 mg by mouth every 8 (eight) hours as needed for nausea or vomiting.   PANTOPRAZOLE (PROTONIX) 40 MG TABLET    Take 40 mg by mouth daily.   POLYETHYL GLYCOL-PROPYL GLYCOL (SYSTANE) 0.4-0.3 % SOLN    Apply 1 drop to eye 2 (two) times daily.    POLYETHYLENE GLYCOL (MIRALAX / GLYCOLAX) PACKET    Take 17 g by mouth daily as needed for mild constipation.    POTASSIUM CHLORIDE (K-DUR) 10 MEQ TABLET    Take 10 mEq by mouth. 3 tablets daily   POTASSIUM CHLORIDE SA (K-DUR,KLOR-CON) 20 MEQ TABLET    Take 20 mEq by mouth. Take one table daily for potassium   SENNA (SENOKOT) 8.6 MG TABLET    Take 1 tablet by mouth. Take 2 at bedtime   TRAMADOL (ULTRAM) 50 MG TABLET    Take 1 tablet (50 mg total) by mouth every 8 (eight) hours as needed for moderate pain.  Modified Medications   No medications on file  Discontinued Medications   No medications on file    Physical Exam: Filed Vitals:   08/14/15 1239  BP: 146/72  Pulse: 74  Temp: 97.5 F (36.4 C)  TempSrc: Tympanic  Resp: 20   There is no weight on file to calculate BMI.  Physical Exam  Constitutional: She is oriented to person, place, and time. She appears well-developed and well-nourished. No distress.  HENT:  Head:  Normocephalic and atraumatic.  Right Ear: External ear normal.  Left Ear: External ear normal.  Nose: Nose normal.  Mouth/Throat: Oropharynx is clear and moist. No oropharyngeal exudate.  Eyes: Conjunctivae and EOM are normal. Pupils are equal, round, and reactive to light. Right eye exhibits no discharge. Left eye exhibits no discharge. No scleral icterus.  Neck: Normal range of motion. Neck supple. No JVD present. No tracheal deviation present. No thyromegaly present.  Cardiovascular: Normal rate, regular rhythm, normal heart sounds and intact distal pulses.  Exam reveals no friction rub.   No murmur heard. Pulmonary/Chest: Effort normal and breath sounds normal. No stridor. No respiratory  distress. She has no wheezes. She has no rales. She exhibits no tenderness.  Abdominal: Soft. Bowel sounds are normal. She exhibits no distension and no mass. There is no tenderness. There is no rebound and no guarding.  Musculoskeletal: Normal range of motion. She exhibits edema and tenderness.  Lower back pain refers to the right thigh and knee.  1+ edema  Lymphadenopathy:    She has no cervical adenopathy.  Neurological: She is alert and oriented to person, place, and time. She has normal reflexes. No cranial nerve deficit. She exhibits normal muscle tone. Coordination abnormal.  Skin: Skin is warm and dry. No rash noted. She is not diaphoretic. No erythema. No pallor.  Psychiatric: She has a normal mood and affect. Her behavior is normal. Judgment and thought content normal.  Anxious.     Labs reviewed: Basic Metabolic Panel:  Recent Labs  08/22/14 2137  11/08/14 1149  12/08/14 1421  12/11/14 0505 12/12/14 0453  02/17/15 1414  04/25/15 06/06/15 06/20/15  NA  --   < > 136  < >  --   < > 134* 132*  < > 134*  < > 138 137 138  K  --   < > 3.7  < >  --   < > 3.5 3.5  < > 3.3*  < > 3.6 3.4 3.8  CL  --   < > 99  < >  --   < > 106 106  --  97  --   --   --   --   CO2  --   < > 25  < >  --   < > 24  25  --  31  --   --   --   --   GLUCOSE  --   < > 103*  < >  --   < > 92 98  --  113*  --   --   --   --   BUN  --   < > 19  < >  --   < > 14 13  < > 23  < > 17 29* 20  CREATININE  --   < > 1.2  < >  --   < > 0.80 0.73  < > 1.06  < > 0.8 0.8 0.8  CALCIUM  --   < > 9.3  < >  --   < > 8.7 8.4  --  10.0  --   --   --   --   MG 1.7  --  1.8  --  1.7  --   --   --   --   --   --   --   --   --   < > = values in this interval not displayed.  Liver Function Tests:  Recent Labs  12/08/14 1036 12/09/14 0158 12/15/14 04/25/15 06/06/15  AST 11 9 8* 8* 9*  ALT <5 5 8 8 8   ALKPHOS 59 51 44 45 48  BILITOT 0.5 0.3  --   --   --   PROT 6.6 5.5*  --   --   --   ALBUMIN 3.8 3.1*  --   --   --     CBC:  Recent Labs  12/08/14 1036  12/11/14 0505 12/12/14 0453  02/17/15 1414 04/25/15 06/06/15 06/20/15  WBC 7.0  < > 6.9 6.9  < > 8.6 6.3 5.1 5.6  NEUTROABS 5.5  --   --   --   --  6.5  --   --   --   HGB 10.7*  < > 9.9* 9.2*  < > 10.2* 10.9* 9.9* 10.2*  HCT 32.5*  < > 30.4* 28.4*  < > 31.3* 35* 31* 32*  MCV 82.9  < > 84.0 83.8  --  76.5*  --   --   --   PLT 269  < > 226 208  < > 400.0 280 284 279  < > = values in this interval not displayed.  Lab Results  Component Value Date   TSH 1.46 04/25/2015   No results found for: HGBA1C Lab Results  Component Value Date   CHOL 199 04/24/2012   HDL 69.30 04/24/2012   LDLCALC 105* 04/24/2012   LDLDIRECT 101.1 04/16/2011   TRIG 122.0 04/24/2012   CHOLHDL 3 04/24/2012    Significant Diagnostic Results since last visit: none  Patient Care Team: Estill Dooms, MD as PCP - General (Internal Medicine) Lafayette Dragon, MD (Gastroenterology) Penni Bombard, MD (Neurology) Rolm Bookbinder, MD as Attending Physician (Dermatology) Clent Jacks, MD (Ophthalmology) Belva Crome, MD as Consulting Physician (Cardiology) Evans Lance, MD as Consulting Physician (Cardiology) Reyne Dumas, MD as Consulting Physician (Internal Medicine) Suella Broad,  MD as Consulting Physician (Physical Medicine and Rehabilitation) Man Mast X, NP as Nurse Practitioner (Nurse Practitioner)  Assessment/Plan Problem List Items Addressed This Visit    ANEMIA, B12 DEFICIENCY    Baseline Hgb 9-10      Essential hypertension    Permissive blood pressure control, continue Maxzide, lisinopril, and hydralazine      Parkinsonian tremor    Takes Sinemet 25/100 tid, flat affect,  no disabling tremor, SNF for care needs.       Constipation    Stable, continue Senokot S II po qhs, Colace bid,  and started 03/06/15 prn MiaLax and MOM      Adjustment disorder with anxious mood    Improved since admitted to SNF, continue Sertraline 50mg  daily and prn Lorazepam      Low back pain radiating to right leg    Pain is controlled,  Continue Tramadol and Tylenol. Ambulates with walker for short distance, w/c to go further.       GERD (gastroesophageal reflux disease)    Stable, continue PPI      Edema    Stable weights and edema.       IDA (iron deficiency anemia)    Stable, Hgb 10s, continue Fe      Memory deficit    Mild, forgetful, continue SNF for care needs.       UTI (urinary tract infection) - Primary    08/10/15 urine culture E Coli, Nitrofurantoin 100mg   Bid x 7 days          Family/ staff Communication: continue SNF for care needs.   Labs/tests ordered:  UA C/S done 08/10/15  [facility] An Mast NP Geriatrics [facility] [address], [place] [address] On Call:  [phone] & follow prompts after 5pm & weekends Office Phone:  [phone] Office Fax:  [phone]

## 2015-08-14 NOTE — Assessment & Plan Note (Signed)
Stable, continue Senokot S II po qhs, Colace bid,  and started 03/06/15 prn MiaLax and MOM 

## 2015-08-23 ENCOUNTER — Non-Acute Institutional Stay (SKILLED_NURSING_FACILITY): Payer: Medicare Other | Admitting: Nurse Practitioner

## 2015-08-23 ENCOUNTER — Encounter: Payer: Self-pay | Admitting: Nurse Practitioner

## 2015-08-23 DIAGNOSIS — I10 Essential (primary) hypertension: Secondary | ICD-10-CM

## 2015-08-23 DIAGNOSIS — K219 Gastro-esophageal reflux disease without esophagitis: Secondary | ICD-10-CM | POA: Diagnosis not present

## 2015-08-23 DIAGNOSIS — F4322 Adjustment disorder with anxiety: Secondary | ICD-10-CM | POA: Diagnosis not present

## 2015-08-23 DIAGNOSIS — M79604 Pain in right leg: Secondary | ICD-10-CM

## 2015-08-23 DIAGNOSIS — N390 Urinary tract infection, site not specified: Secondary | ICD-10-CM

## 2015-08-23 DIAGNOSIS — M545 Low back pain: Secondary | ICD-10-CM

## 2015-08-23 DIAGNOSIS — G20 Parkinson's disease: Secondary | ICD-10-CM | POA: Diagnosis not present

## 2015-08-23 DIAGNOSIS — R609 Edema, unspecified: Secondary | ICD-10-CM | POA: Diagnosis not present

## 2015-08-23 DIAGNOSIS — D509 Iron deficiency anemia, unspecified: Secondary | ICD-10-CM | POA: Diagnosis not present

## 2015-08-23 NOTE — Assessment & Plan Note (Signed)
Pain is controlled, dc Tramadol and Tylenol. Ambulates with walker for short distance, w/c to go further. Observe the patient.

## 2015-08-23 NOTE — Progress Notes (Signed)
Patient ID: Kimberly Silva, female   DOB: May 18, 1926, 81 y.o.   MRN: [id number]  Location:  SNF [facility] Provider:  Marlana Latus NP  Code Status:  DNR Goals of care: Advanced Directive information    Chief Complaint  Patient presents with  . Acute Visit    c/o nausea x 1week     HPI: Patient is a 81 y.o. female seen in the SNF at [facility] today for evaluation of  C/o nauseated x 1 week, denied  abd pain, constipation, diarrhea, staff reported fair appetite, ate 100% dinner 08/21/15, vomited x1 08/22/15, took Zofran and ginger ale with relief  Review of Systems:  Review of Systems  Constitutional: Negative for fever, chills and diaphoresis.       Generalized weakness and gradual weight loss  HENT: Positive for hearing loss. Negative for congestion, ear discharge, ear pain and nosebleeds.   Eyes: Negative for pain, discharge and redness.  Respiratory: Negative for cough, shortness of breath and wheezing.   Cardiovascular: Positive for leg swelling. Negative for chest pain and palpitations.       Much improved, on trace edema in her left ankle seen  Gastrointestinal: Positive for constipation. Negative for nausea, vomiting, abdominal pain and diarrhea.  Genitourinary: Positive for frequency. Negative for dysuria, urgency and flank pain.  Musculoskeletal: Positive for back pain. Negative for myalgias and neck pain.       No c/o pain  Skin: Negative for rash.  Neurological: Positive for tremors and weakness. Negative for dizziness, seizures and headaches.  Psychiatric/Behavioral: Positive for memory loss. The patient is nervous/anxious.     Past Medical History  Diagnosis Date  . HYPERLIPIDEMIA 02/21/2009  . HYPOKALEMIA 08/17/2007  . ANEMIA, B12 DEFICIENCY 03/13/2009  . HYPERTENSION 05/14/2007  . Blood in stool 01/25/2008  . OSTEOARTHRITIS 05/14/2007  . OSTEOPENIA 05/14/2007    2007 hx of fosamax use   . TREMOR 07/08/2007  . FREQUENCY, URINARY 08/19/2008  . LUMBAR STRAIN 09/21/2007    . Gastric polyp     panendo  5 2009  . History of ankle fracture   . Parkinson's disease   . Adenomatous colon polyp   . Lumbar compression fracture   . GERD (gastroesophageal reflux disease)   . Edema   . Iron deficiency anemia   . Anxiety   . Constipation   . Hemorrhoid   . Psoriasis   . Hearing loss   . Memory deficit 06/05/2015    Patient Active Problem List   Diagnosis Date Noted  . UTI (urinary tract infection) 08/10/2015  . Memory deficit 06/05/2015  . Generalized weakness 06/05/2015  . Anemia   . IDA (iron deficiency anemia) 03/26/2015  . Edema 12/22/2014  . Compression fracture of L3 lumbar vertebra 12/14/2014  . GERD (gastroesophageal reflux disease) 12/14/2014  . Low back pain radiating to right leg 11/08/2014  . Complete heart block 08/22/2014  . Syncope 07/19/2014  . Adjustment disorder with anxious mood 05/13/2014  . Left leg pain 11/01/2013  . Benign neoplasm of colon 07/19/2013  . Hemorrhage of rectum and anus 07/19/2013  . Loss of weight 07/13/2013  . Chronic rhinosinusitis 02/03/2013  . Constipation 08/04/2012  . Parkinsonian tremor 07/05/2012  . Decreased hearing 04/17/2011  . Family history of cancer 04/17/2011  . ANEMIA, B12 DEFICIENCY 03/13/2009  . HYPERLIPIDEMIA 02/21/2009  . FREQUENCY, URINARY 08/19/2008  . Blood in stool 01/25/2008  . TREMOR 07/08/2007  . Essential hypertension 05/14/2007  . OSTEOARTHRITIS 05/14/2007  . OSTEOPENIA 05/14/2007  No Known Allergies  Medications: Patient's Medications  New Prescriptions   No medications on file  Previous Medications   ACETAMINOPHEN (TYLENOL) 325 MG TABLET    Take 650 mg by mouth. Take 2 every 6 hours as needed for mild pain   ACETAMINOPHEN (TYLENOL) 500 MG TABLET    Take 2 tablets (1,000 mg total) by mouth 2 (two) times daily.   ASPIRIN 81 MG TABLET    Take 81 mg by mouth daily with breakfast.    CARBIDOPA-LEVODOPA (SINEMET IR) 25-100 MG PER TABLET    Take 1 tablet by mouth 3 (three)  times daily.   CHOLECALCIFEROL (VITAMIN D3) 1000 UNITS CAPS    Take 1,000 Units by mouth daily with breakfast.    DOCUSATE SODIUM (COLACE) 100 MG CAPSULE    Take 100 mg by mouth 2 (two) times daily.   FERROUS SULFATE 325 (65 FE) MG TABLET    Take 325 mg by mouth daily with breakfast.   FLUTICASONE (FLONASE) 50 MCG/ACT NASAL SPRAY    Place 2 sprays into both nostrils at bedtime.   FUROSEMIDE (LASIX) 20 MG TABLET    Take 10 mg by mouth daily.   HYDRALAZINE (APRESOLINE) 25 MG TABLET    Take 1 tablet (25 mg total) by mouth every 8 (eight) hours.   LORAZEPAM (ATIVAN) 0.5 MG TABLET    Take 1 tablet (0.5 mg total) by mouth 2 (two) times daily.   MAGNESIUM 250 MG TABS    Take 250 mg by mouth daily with breakfast.    ONDANSETRON (ZOFRAN) 4 MG TABLET    Take 4 mg by mouth every 8 (eight) hours as needed for nausea or vomiting.   PANTOPRAZOLE (PROTONIX) 40 MG TABLET    Take 40 mg by mouth daily.   POLYETHYL GLYCOL-PROPYL GLYCOL (SYSTANE) 0.4-0.3 % SOLN    Apply 1 drop to eye 2 (two) times daily.    POLYETHYLENE GLYCOL (MIRALAX / GLYCOLAX) PACKET    Take 17 g by mouth daily as needed for mild constipation.    POTASSIUM CHLORIDE (K-DUR) 10 MEQ TABLET    Take 10 mEq by mouth. 3 tablets daily   POTASSIUM CHLORIDE SA (K-DUR,KLOR-CON) 20 MEQ TABLET    Take 20 mEq by mouth. Take one table daily for potassium   SENNA (SENOKOT) 8.6 MG TABLET    Take 1 tablet by mouth. Take 2 at bedtime   TRAMADOL (ULTRAM) 50 MG TABLET    Take 1 tablet (50 mg total) by mouth every 8 (eight) hours as needed for moderate pain.  Modified Medications   No medications on file  Discontinued Medications   No medications on file    Physical Exam: Filed Vitals:   08/23/15 0957  BP: 138/78  Pulse: 72  Temp: 97.8 F (36.6 C)  TempSrc: Tympanic  Resp: 20   There is no weight on file to calculate BMI.  Physical Exam  Constitutional: She is oriented to person, place, and time. She appears well-developed and well-nourished. No  distress.  HENT:  Head: Normocephalic and atraumatic.  Right Ear: External ear normal.  Left Ear: External ear normal.  Nose: Nose normal.  Mouth/Throat: Oropharynx is clear and moist. No oropharyngeal exudate.  Eyes: Conjunctivae and EOM are normal. Pupils are equal, round, and reactive to light. Right eye exhibits no discharge. Left eye exhibits no discharge. No scleral icterus.  Neck: Normal range of motion. Neck supple. No JVD present. No tracheal deviation present. No thyromegaly present.  Cardiovascular: Normal rate, regular  rhythm, normal heart sounds and intact distal pulses.  Exam reveals no friction rub.   No murmur heard. Pulmonary/Chest: Effort normal and breath sounds normal. No stridor. No respiratory distress. She has no wheezes. She has no rales. She exhibits no tenderness.  Abdominal: Soft. Bowel sounds are normal. She exhibits no distension. There is no tenderness. There is no rebound and no guarding.  Musculoskeletal: Normal range of motion. She exhibits edema and tenderness.  Lower back pain refers to the right thigh and knee--denied upon today's visist Only trace edema seen in the left ankle.   Lymphadenopathy:    She has no cervical adenopathy.  Neurological: She is alert and oriented to person, place, and time. She has normal reflexes. No cranial nerve deficit. She exhibits normal muscle tone. Coordination abnormal.  Skin: Skin is warm and dry. No rash noted. She is not diaphoretic. No erythema. No pallor.  Psychiatric: She has a normal mood and affect. Her behavior is normal. Judgment and thought content normal.  Forgetful, stated not feeling well, complained food was prepared as she expected    Labs reviewed: Basic Metabolic Panel:  Recent Labs  11/08/14 1149  12/08/14 1421  12/11/14 0505 12/12/14 0453  02/17/15 1414  04/25/15 06/06/15 06/20/15  NA 136  < >  --   < > 134* 132*  < > 134*  < > 138 137 138  K 3.7  < >  --   < > 3.5 3.5  < > 3.3*  < > 3.6 3.4  3.8  CL 99  < >  --   < > 106 106  --  97  --   --   --   --   CO2 25  < >  --   < > 24 25  --  31  --   --   --   --   GLUCOSE 103*  < >  --   < > 92 98  --  113*  --   --   --   --   BUN 19  < >  --   < > 14 13  < > 23  < > 17 29* 20  CREATININE 1.2  < >  --   < > 0.80 0.73  < > 1.06  < > 0.8 0.8 0.8  CALCIUM 9.3  < >  --   < > 8.7 8.4  --  10.0  --   --   --   --   MG 1.8  --  1.7  --   --   --   --   --   --   --   --   --   < > = values in this interval not displayed.  Liver Function Tests:  Recent Labs  12/08/14 1036 12/09/14 0158 12/15/14 04/25/15 06/06/15  AST 11 9 8* 8* 9*  ALT <5 5 8 8 8   ALKPHOS 59 51 44 45 48  BILITOT 0.5 0.3  --   --   --   PROT 6.6 5.5*  --   --   --   ALBUMIN 3.8 3.1*  --   --   --     CBC:  Recent Labs  12/08/14 1036  12/11/14 0505 12/12/14 0453  02/17/15 1414 04/25/15 06/06/15 06/20/15  WBC 7.0  < > 6.9 6.9  < > 8.6 6.3 5.1 5.6  NEUTROABS 5.5  --   --   --   --  6.5  --   --   --   HGB 10.7*  < > 9.9* 9.2*  < > 10.2* 10.9* 9.9* 10.2*  HCT 32.5*  < > 30.4* 28.4*  < > 31.3* 35* 31* 32*  MCV 82.9  < > 84.0 83.8  --  76.5*  --   --   --   PLT 269  < > 226 208  < > 400.0 280 284 279  < > = values in this interval not displayed.  Lab Results  Component Value Date   TSH 1.46 04/25/2015   No results found for: HGBA1C Lab Results  Component Value Date   CHOL 199 04/24/2012   HDL 69.30 04/24/2012   LDLCALC 105* 04/24/2012   LDLDIRECT 101.1 04/16/2011   TRIG 122.0 04/24/2012   CHOLHDL 3 04/24/2012    Significant Diagnostic Results since last visit: none  Patient Care Team: Estill Dooms, MD as PCP - General (Internal Medicine) Lafayette Dragon, MD (Gastroenterology) Penni Bombard, MD (Neurology) Rolm Bookbinder, MD as Attending Physician (Dermatology) Clent Jacks, MD (Ophthalmology) Belva Crome, MD as Consulting Physician (Cardiology) Evans Lance, MD as Consulting Physician (Cardiology) Reyne Dumas, MD as Consulting  Physician (Internal Medicine) Suella Broad, MD as Consulting Physician (Physical Medicine and Rehabilitation) Man Mast X, NP as Nurse Practitioner (Nurse Practitioner)  Assessment/Plan Problem List Items Addressed This Visit    Essential hypertension    Permissive blood pressure control, continue Hydralazine 25mg  tid.       Parkinsonian tremor    Takes Sinemet 25/100 tid, flat affect,  no disabling tremor, SNF for care needs.       Adjustment disorder with anxious mood    Multiple short lived complaints, continue Sertraline 50mg  daily and prn Lorazepam      Low back pain radiating to right leg    Pain is controlled, dc Tramadol and Tylenol. Ambulates with walker for short distance, w/c to go further. Observe the patient.       GERD (gastroesophageal reflux disease)    C/o nauseated x about a week, 7 day course of Nitrofurantoin started 08/10/15 may aggravated her GI symptoms, will continue Famotidine and prn Zofran, dc ASA, Fem Kcl, Furosemide, Vit D3, Tramadol, Tylenol, observe GI symptoms, CBC, CMP, BNP in am      Edema    Not apparent, noted dry lips, in setting of c/o nausea and vomited x1, will dc furosemide and kcl, update CMP and BNP      IDA (iron deficiency anemia)    Stable, Hgb 10.2 06/20/15, dc Fe, repeat CBC, continue Vit B12      UTI (urinary tract infection) - Primary    Fully treated, clinical free of UTI symptoms.           Family/ staff Communication: observe the patient.   Labs/tests ordered:  CBC, CMP, BNP  ManXie Mast NP Geriatrics [facility] [address], [place] [address] On Call:  [phone] & follow prompts after 5pm & weekends Office Phone:  [phone] Office Fax:  [phone]

## 2015-08-23 NOTE — Assessment & Plan Note (Signed)
Stable, Hgb 10.2 06/20/15, dc Fe, repeat CBC, continue Vit B12

## 2015-08-23 NOTE — Assessment & Plan Note (Signed)
Takes Sinemet 25/100 tid, flat affect,  no disabling tremor, SNF for care needs.  

## 2015-08-23 NOTE — Assessment & Plan Note (Signed)
C/o nauseated x about a week, 7 day course of Nitrofurantoin started 08/10/15 may aggravated her GI symptoms, will continue Famotidine and prn Zofran, dc ASA, Fem Kcl, Furosemide, Vit D3, Tramadol, Tylenol, observe GI symptoms, CBC, CMP, BNP in am

## 2015-08-23 NOTE — Assessment & Plan Note (Signed)
Multiple short lived complaints, continue Sertraline 50mg  daily and prn Lorazepam

## 2015-08-23 NOTE — Assessment & Plan Note (Signed)
Permissive blood pressure control, continue Hydralazine 25mg  tid.

## 2015-08-23 NOTE — Assessment & Plan Note (Signed)
Fully treated, clinical free of UTI symptoms.

## 2015-08-23 NOTE — Assessment & Plan Note (Signed)
Not apparent, noted dry lips, in setting of c/o nausea and vomited x1, will dc furosemide and kcl, update CMP and BNP

## 2015-08-24 LAB — BASIC METABOLIC PANEL
BUN: 21 mg/dL (ref 4–21)
Creatinine: 0.8 mg/dL (ref 0.5–1.1)
Glucose: 71 mg/dL
Potassium: 3.2 mmol/L — AB (ref 3.4–5.3)
SODIUM: 139 mmol/L (ref 137–147)

## 2015-08-24 LAB — CBC AND DIFFERENTIAL
HCT: 33 % — AB (ref 36–46)
Hemoglobin: 11.3 g/dL — AB (ref 12.0–16.0)
Platelets: 274 10*3/uL (ref 150–399)
WBC: 8 10^3/mL

## 2015-08-24 LAB — HEPATIC FUNCTION PANEL
ALK PHOS: 46 U/L (ref 25–125)
ALT: 3 U/L — AB (ref 7–35)
AST: 9 U/L — AB (ref 13–35)
Bilirubin, Total: 0.4 mg/dL

## 2015-08-28 ENCOUNTER — Other Ambulatory Visit: Payer: Self-pay | Admitting: Nurse Practitioner

## 2015-08-28 DIAGNOSIS — D518 Other vitamin B12 deficiency anemias: Secondary | ICD-10-CM

## 2015-08-28 DIAGNOSIS — R609 Edema, unspecified: Secondary | ICD-10-CM

## 2015-09-05 LAB — BASIC METABOLIC PANEL
BUN: 22 mg/dL — AB (ref 4–21)
Creatinine: 0.6 mg/dL (ref 0.5–1.1)
Glucose: 67 mg/dL
POTASSIUM: 3.8 mmol/L (ref 3.4–5.3)
SODIUM: 139 mmol/L (ref 137–147)

## 2015-09-06 ENCOUNTER — Other Ambulatory Visit: Payer: Self-pay | Admitting: Nurse Practitioner

## 2015-09-06 DIAGNOSIS — R609 Edema, unspecified: Secondary | ICD-10-CM

## 2015-10-05 ENCOUNTER — Encounter: Payer: Self-pay | Admitting: Internal Medicine

## 2015-10-05 ENCOUNTER — Non-Acute Institutional Stay (SKILLED_NURSING_FACILITY): Payer: Medicare Other | Admitting: Internal Medicine

## 2015-10-05 DIAGNOSIS — G20 Parkinson's disease: Secondary | ICD-10-CM | POA: Diagnosis not present

## 2015-10-05 DIAGNOSIS — R609 Edema, unspecified: Secondary | ICD-10-CM | POA: Diagnosis not present

## 2015-10-05 DIAGNOSIS — E785 Hyperlipidemia, unspecified: Secondary | ICD-10-CM | POA: Diagnosis not present

## 2015-10-05 DIAGNOSIS — K219 Gastro-esophageal reflux disease without esophagitis: Secondary | ICD-10-CM

## 2015-10-05 DIAGNOSIS — R413 Other amnesia: Secondary | ICD-10-CM | POA: Diagnosis not present

## 2015-10-05 DIAGNOSIS — I10 Essential (primary) hypertension: Secondary | ICD-10-CM

## 2015-10-05 DIAGNOSIS — D500 Iron deficiency anemia secondary to blood loss (chronic): Secondary | ICD-10-CM | POA: Diagnosis not present

## 2015-10-05 MED ORDER — HYDRALAZINE HCL 25 MG PO TABS: ORAL_TABLET | ORAL | Status: DC

## 2015-10-05 NOTE — Progress Notes (Signed)
Patient ID: Kimberly Silva, female   DOB: Jan 02, 1926, 81 y.o.   MRN: [id number]    [place] Room Number: 1  Place of Service: SNF (31)     No Known Allergies  Chief Complaint  Patient presents with  . Medical Management of Chronic Issues    HPI:   Iron deficiency anemia due to chronic blood loss - improved. This anemia has been variously categorized as anemia with iron deficiency and anemia secondary to B12 deficiency. She is getting oral B12 supplements and remains on iron at this time. We'll plan a lab follow-up in November.  Essential hypertension - controlled. Patient has been on hydralazine. This drug may cause peripheral edema. We will start tapering her off of that.  Edema, unspecified type - stable. Possibly secondary to hydralazine.  Dyslipidemia - no recent lab.  Gastroesophageal reflux disease without esophagitis - asymptomatic current medication  Memory deficit - mild and unchanged.   Parkinsonian tremor ([place]) - she is on low-dose Sinemet 25/103 times daily. Maybe a little better on this medication.    Medications: Patient's Medications  New Prescriptions   No medications on file  Previous Medications   ASPIRIN 81 MG TABLET    Take 81 mg by mouth daily with breakfast.    CARBIDOPA-LEVODOPA (SINEMET IR) 25-100 MG PER TABLET    Take 1 tablet by mouth 3 (three) times daily.   CHOLECALCIFEROL (VITAMIN D3) 1000 UNITS CAPS    Take 1,000 Units by mouth daily with breakfast.    FAMOTIDINE (PEPCID) 20 MG TABLET    Take 20 mg by mouth 2 (two) times daily. 1 at bedtime to reduce stomach acid   FUROSEMIDE (LASIX) 20 MG TABLET    Take 10 mg by mouth daily.   HYDRALAZINE (APRESOLINE) 25 MG TABLET    Take 1 tablet (25 mg total) by mouth every 8 (eight) hours.   LORAZEPAM (ATIVAN) 0.5 MG TABLET    Take 1 tablet (0.5 mg total) by mouth 2 (two) times daily.   ONDANSETRON (ZOFRAN) 4 MG TABLET    Take 4 mg by mouth every 8 (eight) hours as  needed for nausea or vomiting.   POLYETHYL GLYCOL-PROPYL GLYCOL (SYSTANE) 0.4-0.3 % SOLN    Apply 1 drop to eye 2 (two) times daily.    POLYETHYLENE GLYCOL (MIRALAX / GLYCOLAX) PACKET    Take 17 g by mouth daily as needed for mild constipation.    POTASSIUM CHLORIDE SA (K-DUR,KLOR-CON) 20 MEQ TABLET    Take 20 mEq by mouth. Take one table daily for potassium   SERTRALINE (ZOLOFT) 50 MG TABLET    Take 50 mg by mouth daily. 1 daily for depression and anxiety   VITAMIN B-12 (CYANOCOBALAMIN) 1000 MCG TABLET    Take 1,000 mcg by mouth daily. One daily for B 12 supplement  Modified Medications   No medications on file  Discontinued Medications   ACETAMINOPHEN (TYLENOL) 325 MG TABLET    Take 650 mg by mouth. Take 2 every 6 hours as needed for mild pain   ACETAMINOPHEN (TYLENOL) 500 MG TABLET    Take 2 tablets (1,000 mg total) by mouth 2 (two) times daily.   DOCUSATE SODIUM (COLACE) 100 MG CAPSULE    Take 100 mg by mouth 2 (two) times daily.   FERROUS SULFATE 325 (65 FE) MG TABLET    Take 325 mg by mouth daily with breakfast.   FLUTICASONE (FLONASE) 50 MCG/ACT NASAL SPRAY    Place 2  sprays into both nostrils at bedtime.   MAGNESIUM 250 MG TABS    Take 250 mg by mouth daily with breakfast.    PANTOPRAZOLE (PROTONIX) 40 MG TABLET    Take 40 mg by mouth daily.   POTASSIUM CHLORIDE (K-DUR) 10 MEQ TABLET    Take 10 mEq by mouth. 3 tablets daily   SENNA (SENOKOT) 8.6 MG TABLET    Take 1 tablet by mouth. Take 2 at bedtime   TRAMADOL (ULTRAM) 50 MG TABLET    Take 1 tablet (50 mg total) by mouth every 8 (eight) hours as needed for moderate pain.     Review of Systems  Constitutional: Negative for fever, chills and diaphoresis.       Generalized weakness and gradual weight loss  HENT: Positive for hearing loss. Negative for congestion, ear discharge, ear pain and nosebleeds.   Eyes: Negative for pain, discharge and redness.  Respiratory: Negative for cough, shortness of breath and wheezing.     Cardiovascular: Positive for leg swelling. Negative for chest pain and palpitations.       Much improved, on trace edema in her left ankle seen  Gastrointestinal: Positive for constipation. Negative for nausea, vomiting, abdominal pain and diarrhea.  Genitourinary: Positive for frequency. Negative for dysuria, urgency and flank pain.  Musculoskeletal: Positive for back pain. Negative for myalgias and neck pain.       No c/o pain  Skin: Negative for rash.  Neurological: Positive for tremors and weakness. Negative for dizziness, seizures and headaches.  Psychiatric/Behavioral: The patient is nervous/anxious.     Filed Vitals:   10/05/15 1541  BP: 125/70  Pulse: 73  Temp: 98.6 F (37 C)  Resp: 19  Height: $Remove'5\' 1"'pnRbKnp$  (1.549 m)  Weight: 102 lb 3.2 oz (46.358 kg)   Body mass index is 19.32 kg/(m^2).  Physical Exam  Constitutional: She is oriented to person, place, and time. She appears well-developed and well-nourished. No distress.  HENT:  Head: Normocephalic and atraumatic.  Right Ear: External ear normal.  Left Ear: External ear normal.  Nose: Nose normal.  Mouth/Throat: Oropharynx is clear and moist. No oropharyngeal exudate.  Eyes: Conjunctivae and EOM are normal. Pupils are equal, round, and reactive to light. Right eye exhibits no discharge. Left eye exhibits no discharge. No scleral icterus.  Neck: Normal range of motion. Neck supple. No JVD present. No tracheal deviation present. No thyromegaly present.  Cardiovascular: Normal rate, regular rhythm, normal heart sounds and intact distal pulses.  Exam reveals no friction rub.   No murmur heard. Pulmonary/Chest: Effort normal and breath sounds normal. No stridor. No respiratory distress. She has no wheezes. She has no rales. She exhibits no tenderness.  Abdominal: Soft. Bowel sounds are normal. She exhibits no distension. There is no tenderness. There is no rebound and no guarding.  Musculoskeletal: Normal range of motion. She  exhibits edema and tenderness.  Lower back pain refers to the right thigh and knee--denied upon today's visist Only trace edema seen in the left ankle.   Lymphadenopathy:    She has no cervical adenopathy.  Neurological: She is alert and oriented to person, place, and time. She has normal reflexes. No cranial nerve deficit. She exhibits normal muscle tone. Coordination abnormal.  Skin: Skin is warm and dry. No rash noted. She is not diaphoretic. No erythema. No pallor.  Psychiatric: She has a normal mood and affect. Her behavior is normal. Judgment and thought content normal.  Forgetful     Labs reviewed: Lab  Summary Latest Ref Rng 09/05/2015 08/24/2015 06/20/2015 06/06/2015  Hemoglobin 12.0 - 16.0 g/dL (None) 11.3(A) 10.2(A) 9.9(A)  Hematocrit 36 - 46 % (None) 33(A) 32(A) 31(A)  White count - (None) 8.0 5.6 5.1  Platelet count 150 - 399 K/L (None) 274 279 284  Sodium 137 - 147 mmol/L 139 139 138 137  Potassium 3.4 - 5.3 mmol/L 3.8 3.2(A) 3.8 3.4  Calcium - (None) (None) (None) (None)  Phosphorus - (None) (None) (None) (None)  Creatinine 0.5 - 1.1 mg/dL 0.6 0.8 0.8 0.8  AST 13 - 35 U/L (None) 9(A) (None) 9(A)  Alk Phos 25 - 125 U/L (None) 46 (None) 48  Bilirubin - (None) (None) (None) (None)  Glucose - 67 71 87 83  Cholesterol - (None) (None) (None) (None)  HDL cholesterol - (None) (None) (None) (None)  Triglycerides - (None) (None) (None) (None)  LDL Direct - (None) (None) (None) (None)  LDL Calc - (None) (None) (None) (None)  Total protein - (None) (None) (None) (None)  Albumin - (None) (None) (None) (None)   Lab Results  Component Value Date   TSH 1.46 04/25/2015   Lab Results  Component Value Date   BUN 22* 09/05/2015   No results found for: HGBA1C     Assessment/Plan  1. Iron deficiency anemia due to chronic blood loss -Plan CBC in November  2. Essential hypertension Hydralazine 25 mg 3 times a day is reduced to twice a day. Plan further reductions of blood  pressure remains  3. Edema, unspecified type No longer on furosemide.  4. Dyslipidemia -Lipid panel  5. Gastroesophageal reflux disease without esophagitis Stable on famotidine 20 mg at bedtime  6. Memory deficit Stable  7. Parkinsonian tremor [facility]) May be improved with current dose of Sinemet.

## 2015-10-09 ENCOUNTER — Encounter: Payer: Self-pay | Admitting: Nurse Practitioner

## 2015-10-09 ENCOUNTER — Non-Acute Institutional Stay (SKILLED_NURSING_FACILITY): Payer: Medicare Other | Admitting: Nurse Practitioner

## 2015-10-09 DIAGNOSIS — G20 Parkinson's disease: Secondary | ICD-10-CM | POA: Diagnosis not present

## 2015-10-09 DIAGNOSIS — R609 Edema, unspecified: Secondary | ICD-10-CM | POA: Diagnosis not present

## 2015-10-09 DIAGNOSIS — B372 Candidiasis of skin and nail: Secondary | ICD-10-CM | POA: Diagnosis not present

## 2015-10-09 DIAGNOSIS — F4322 Adjustment disorder with anxiety: Secondary | ICD-10-CM

## 2015-10-09 DIAGNOSIS — K5900 Constipation, unspecified: Secondary | ICD-10-CM | POA: Diagnosis not present

## 2015-10-09 DIAGNOSIS — M545 Low back pain: Secondary | ICD-10-CM

## 2015-10-09 DIAGNOSIS — D518 Other vitamin B12 deficiency anemias: Secondary | ICD-10-CM | POA: Diagnosis not present

## 2015-10-09 DIAGNOSIS — G20C Parkinsonism, unspecified: Secondary | ICD-10-CM

## 2015-10-09 DIAGNOSIS — M79604 Pain in right leg: Secondary | ICD-10-CM

## 2015-10-09 DIAGNOSIS — R413 Other amnesia: Secondary | ICD-10-CM | POA: Diagnosis not present

## 2015-10-09 DIAGNOSIS — K219 Gastro-esophageal reflux disease without esophagitis: Secondary | ICD-10-CM | POA: Diagnosis not present

## 2015-10-09 DIAGNOSIS — I10 Essential (primary) hypertension: Secondary | ICD-10-CM

## 2015-10-09 HISTORY — DX: Candidiasis of skin and nail: B37.2

## 2015-10-09 NOTE — Assessment & Plan Note (Signed)
Stable, continue Senokot S II po qhs, Colace bid,  and started 03/06/15 prn MiaLax and MOM 

## 2015-10-09 NOTE — Assessment & Plan Note (Signed)
Pain is chronic, on and off with weight bearing or ambulation, positional, offTramadol and continueTylenol. Ambulates with walker for short distance, w/c to go further. Observe the patient.

## 2015-10-09 NOTE — Assessment & Plan Note (Signed)
Not apparent, off Furosemide.

## 2015-10-09 NOTE — Assessment & Plan Note (Signed)
Stable, continue Famotidine.  

## 2015-10-09 NOTE — Assessment & Plan Note (Signed)
Mood is stabilized, continue Sertraline 50mg  daily and prn Lorazepam

## 2015-10-09 NOTE — Assessment & Plan Note (Signed)
Beefy redness in groins and intergluteal cleft, Nystatin powder has minimal efficacy, will apply Mycolog II cream bid to affected the area until healed in addition to Diflucan 100mg  po daily x3

## 2015-10-09 NOTE — Assessment & Plan Note (Signed)
Baseline Hgb 9-10, last Hgb 11.3 08/24/15

## 2015-10-09 NOTE — Progress Notes (Signed)
Patient ID: Kimberly Silva, female   DOB: 08/31/1926, 81 y.o.   MRN: [id number]  Location:  SNF [facility] Provider:  Marlana Latus NP  Code Status:  DNR Goals of care: Advanced Directive information    Chief Complaint  Patient presents with  . Medical Management of Chronic Issues  . Acute Visit    groin rash     HPI: Patient is a 81 y.o. female seen in the SNF at [facility] today for evaluation of redness in groins and intergluteal cleft for unknown length of time.   Review of Systems:  Review of Systems  Constitutional: Negative for fever, chills and diaphoresis.       Gradual weight loss  HENT: Positive for hearing loss. Negative for congestion, ear pain and nosebleeds.   Eyes: Negative for pain, discharge and redness.  Respiratory: Negative for cough, shortness of breath and wheezing.   Cardiovascular: Positive for leg swelling. Negative for chest pain and palpitations.       Much improved, on trace edema in her left ankle seen  Gastrointestinal: Negative for nausea, vomiting, diarrhea and constipation.  Genitourinary: Positive for frequency. Negative for dysuria, urgency and flank pain.  Musculoskeletal: Positive for back pain. Negative for myalgias and neck pain.       Right lower back pain with walking or weight bearing on and off, positional.   Skin: Negative for rash.  Neurological: Positive for tremors. Negative for dizziness, seizures, weakness and headaches.  Psychiatric/Behavioral: Positive for memory loss. The patient is nervous/anxious.     Past Medical History  Diagnosis Date  . HYPERLIPIDEMIA 02/21/2009  . HYPOKALEMIA 08/17/2007  . ANEMIA, B12 DEFICIENCY 03/13/2009  . HYPERTENSION 05/14/2007  . Blood in stool 01/25/2008  . OSTEOARTHRITIS 05/14/2007  . OSTEOPENIA 05/14/2007    2007 hx of fosamax use   . TREMOR 07/08/2007  . FREQUENCY, URINARY 08/19/2008  . LUMBAR STRAIN 09/21/2007  . Gastric polyp     panendo  5 2009  . History of ankle fracture   .  Parkinson's disease   . Adenomatous colon polyp   . Lumbar compression fracture ([place])   . GERD (gastroesophageal reflux disease)   . Edema   . Iron deficiency anemia   . Anxiety   . Constipation   . Hemorrhoid   . Psoriasis   . Hearing loss   . Memory deficit 06/05/2015    Patient Active Problem List   Diagnosis Date Noted  . Candidiasis of skin 10/09/2015  . UTI (urinary tract infection) 08/10/2015  . Memory deficit 06/05/2015  . Generalized weakness 06/05/2015  . Anemia   . IDA (iron deficiency anemia) 03/26/2015  . Edema 12/22/2014  . Compression fracture of L3 lumbar vertebra ([place]) 12/14/2014  . GERD (gastroesophageal reflux disease) 12/14/2014  . Low back pain radiating to right leg 11/08/2014  . Complete heart block ([place]) 08/22/2014  . Syncope 07/19/2014  . Adjustment disorder with anxious mood 05/13/2014  . Left leg pain 11/01/2013  . Benign neoplasm of colon 07/19/2013  . Hemorrhage of rectum and anus 07/19/2013  . Chronic rhinosinusitis 02/03/2013  . Constipation 08/04/2012  . Parkinsonian tremor ([place]) 07/05/2012  . Decreased hearing 04/17/2011  . Family history of cancer 04/17/2011  . ANEMIA, B12 DEFICIENCY 03/13/2009  . Dyslipidemia 02/21/2009  . FREQUENCY, URINARY 08/19/2008  . Essential hypertension 05/14/2007  . OSTEOARTHRITIS 05/14/2007  . OSTEOPENIA 05/14/2007    No Known Allergies  Medications: Patient's Medications  New Prescriptions   No medications on file  Previous Medications   ASPIRIN 81 MG TABLET    Take 81 mg by mouth daily with breakfast.    CARBIDOPA-LEVODOPA (SINEMET IR) 25-100 MG PER TABLET    Take 1 tablet by mouth 3 (three) times daily.   CHOLECALCIFEROL (VITAMIN D3) 1000 UNITS CAPS    Take 1,000 Units by mouth daily with breakfast.    FAMOTIDINE (PEPCID) 20 MG TABLET    Take 20 mg by mouth 2 (two) times daily. 1 at bedtime to reduce stomach acid   FUROSEMIDE (LASIX) 20 MG TABLET    Take 10 mg by mouth daily.   HYDRALAZINE  (APRESOLINE) 25 MG TABLET    One twice daily to control blood pressure   LORAZEPAM (ATIVAN) 0.5 MG TABLET    Take 1 tablet (0.5 mg total) by mouth 2 (two) times daily.   ONDANSETRON (ZOFRAN) 4 MG TABLET    Take 4 mg by mouth every 8 (eight) hours as needed for nausea or vomiting.   POLYETHYL GLYCOL-PROPYL GLYCOL (SYSTANE) 0.4-0.3 % SOLN    Apply 1 drop to eye 2 (two) times daily.    POLYETHYLENE GLYCOL (MIRALAX / GLYCOLAX) PACKET    Take 17 g by mouth daily as needed for mild constipation.    POTASSIUM CHLORIDE SA (K-DUR,KLOR-CON) 20 MEQ TABLET    Take 20 mEq by mouth. Take one table daily for potassium   SERTRALINE (ZOLOFT) 50 MG TABLET    Take 50 mg by mouth daily. 1 daily for depression and anxiety   VITAMIN B-12 (CYANOCOBALAMIN) 1000 MCG TABLET    Take 1,000 mcg by mouth daily. One daily for B 12 supplement  Modified Medications   No medications on file  Discontinued Medications   No medications on file    Physical Exam: Filed Vitals:   10/09/15 1216  BP: 134/74  Pulse: 69  Temp: 98.1 F (36.7 C)  TempSrc: Tympanic  Resp: 16   There is no weight on file to calculate BMI.  Physical Exam  Constitutional: She is oriented to person, place, and time. She appears well-developed and well-nourished. No distress.  HENT:  Head: Normocephalic and atraumatic.  Right Ear: External ear normal.  Left Ear: External ear normal.  Nose: Nose normal.  Mouth/Throat: Oropharynx is clear and moist. No oropharyngeal exudate.  Eyes: Conjunctivae and EOM are normal. Pupils are equal, round, and reactive to light. Right eye exhibits no discharge. Left eye exhibits no discharge. No scleral icterus.  Neck: Normal range of motion. Neck supple. No JVD present. No tracheal deviation present. No thyromegaly present.  Cardiovascular: Normal rate, regular rhythm, normal heart sounds and intact distal pulses.  Exam reveals no friction rub.   No murmur heard. Pulmonary/Chest: Effort normal and breath sounds  normal. No stridor. No respiratory distress. She has no wheezes. She has no rales. She exhibits no tenderness.  Abdominal: Soft. Bowel sounds are normal. She exhibits no distension. There is no tenderness. There is no rebound and no guarding.  Musculoskeletal: Normal range of motion. She exhibits edema and tenderness.  Lower back pain refers to the right thigh and knee--positional, Tylenol was requested Only trace edema seen in the left ankle.   Lymphadenopathy:    She has no cervical adenopathy.  Neurological: She is alert and oriented to person, place, and time. She has normal reflexes. No cranial nerve deficit. She exhibits normal muscle tone. Coordination abnormal.  Skin: Skin is warm and dry. No rash noted. She is not diaphoretic. No erythema. No pallor.  Beefy  red areas in groins and intergluteal cleft  Psychiatric: She has a normal mood and affect. Her behavior is normal. Judgment and thought content normal.  Forgetful, stated she slept better    Labs reviewed: Basic Metabolic Panel:  Recent Labs  11/08/14 1149  12/08/14 1421  12/11/14 0505 12/12/14 0453  02/17/15 1414  06/20/15 08/24/15 09/05/15  NA 136  < >  --   < > 134* 132*  < > 134*  < > 138 139 139  K 3.7  < >  --   < > 3.5 3.5  < > 3.3*  < > 3.8 3.2* 3.8  CL 99  < >  --   < > 106 106  --  97  --   --   --   --   CO2 25  < >  --   < > 24 25  --  31  --   --   --   --   GLUCOSE 103*  < >  --   < > 92 98  --  113*  --   --   --   --   BUN 19  < >  --   < > 14 13  < > 23  < > 20 21 22*  CREATININE 1.2  < >  --   < > 0.80 0.73  < > 1.06  < > 0.8 0.8 0.6  CALCIUM 9.3  < >  --   < > 8.7 8.4  --  10.0  --   --   --   --   MG 1.8  --  1.7  --   --   --   --   --   --   --   --   --   < > = values in this interval not displayed.  Liver Function Tests:  Recent Labs  12/08/14 1036 12/09/14 0158  04/25/15 06/06/15 08/24/15  AST 11 9  < > 8* 9* 9*  ALT <5 <5  < > 8 8 3*  ALKPHOS 59 51  < > 45 48 46  BILITOT 0.5 0.3  --    --   --   --   PROT 6.6 5.5*  --   --   --   --   ALBUMIN 3.8 3.1*  --   --   --   --   < > = values in this interval not displayed.  CBC:  Recent Labs  12/08/14 1036  12/11/14 0505 12/12/14 0453  02/17/15 1414  06/06/15 06/20/15 08/24/15  WBC 7.0  < > 6.9 6.9  < > 8.6  < > 5.1 5.6 8.0  NEUTROABS 5.5  --   --   --   --  6.5  --   --   --   --   HGB 10.7*  < > 9.9* 9.2*  < > 10.2*  < > 9.9* 10.2* 11.3*  HCT 32.5*  < > 30.4* 28.4*  < > 31.3*  < > 31* 32* 33*  MCV 82.9  < > 84.0 83.8  --  76.5*  --   --   --   --   PLT 269  < > 226 208  < > 400.0  < > 284 279 274  < > = values in this interval not displayed.  Lab Results  Component Value Date   TSH 1.46 04/25/2015   No results found for: HGBA1C Lab Results  Component Value Date   CHOL 199 04/24/2012   HDL 69.30 04/24/2012   LDLCALC 105* 04/24/2012   LDLDIRECT 101.1 04/16/2011   TRIG 122.0 04/24/2012   CHOLHDL 3 04/24/2012    Significant Diagnostic Results since last visit: none  Patient Care Team: Estill Dooms, MD as PCP - General (Internal Medicine) Lafayette Dragon, MD (Gastroenterology) Penni Bombard, MD (Neurology) Rolm Bookbinder, MD as Attending Physician (Dermatology) Clent Jacks, MD (Ophthalmology) Belva Crome, MD as Consulting Physician (Cardiology) Evans Lance, MD as Consulting Physician (Cardiology) Reyne Dumas, MD as Consulting Physician (Internal Medicine) Suella Broad, MD as Consulting Physician (Physical Medicine and Rehabilitation) Man Mast X, NP as Nurse Practitioner (Nurse Practitioner)  Assessment/Plan Problem List Items Addressed This Visit    Adjustment disorder with anxious mood (Chronic)    Mood is stabilized, continue Sertraline 50mg  daily and prn Lorazepam      ANEMIA, B12 DEFICIENCY    Baseline Hgb 9-10, last Hgb 11.3 08/24/15      Candidiasis of skin - Primary    Beefy redness in groins and intergluteal cleft, Nystatin powder has minimal efficacy, will apply Mycolog II cream  bid to affected the area until healed in addition to Diflucan 100mg  po daily x3       Constipation    Stable, continue Senokot S II po qhs, Colace bid,  and started 03/06/15 prn MiaLax and MOM      Edema (Chronic)    Not apparent, off Furosemide.       Essential hypertension (Chronic)    Permissive blood pressure control, continue Hydralazine 25mg  bid since 10/05/15, Bp 134/74 today.       GERD (gastroesophageal reflux disease) (Chronic)    Stable, continue Famotidine.       Low back pain radiating to right leg    Pain is chronic, on and off with weight bearing or ambulation, positional, offTramadol and continueTylenol. Ambulates with walker for short distance, w/c to go further. Observe the patient.       Memory deficit (Chronic)    Mild, forgetful, continue SNF for care needs.       Parkinsonian tremor ([facility]) (Chronic)    Takes Sinemet 25/100 tid, flat affect,  no disabling tremor, SNF for care needs.          Family/ staff Communication: observe the patient.   Labs/tests ordered:  none  ManXie Mast NP [facility] [address], [place] [address] On Call:  [phone] & follow prompts after 5pm & weekends Office Phone:  [phone] Office Fax:  [phone]

## 2015-10-09 NOTE — Assessment & Plan Note (Signed)
Takes Sinemet 25/100 tid, flat affect,  no disabling tremor, SNF for care needs.  

## 2015-10-09 NOTE — Assessment & Plan Note (Signed)
Mild, forgetful, continue SNF for care needs.

## 2015-10-09 NOTE — Assessment & Plan Note (Signed)
Permissive blood pressure control, continue Hydralazine 25mg  bid since 10/05/15, Bp 134/74 today.

## 2015-10-19 ENCOUNTER — Other Ambulatory Visit: Payer: Self-pay | Admitting: Nurse Practitioner

## 2015-10-19 DIAGNOSIS — D518 Other vitamin B12 deficiency anemias: Secondary | ICD-10-CM

## 2015-10-19 DIAGNOSIS — D649 Anemia, unspecified: Secondary | ICD-10-CM

## 2015-10-19 LAB — CBC AND DIFFERENTIAL
HEMATOCRIT: 34 % — AB (ref 36–46)
HEMOGLOBIN: 11 g/dL — AB (ref 12.0–16.0)
Platelets: 261 10*3/uL (ref 150–399)
WBC: 8.4 10*3/mL

## 2015-11-13 ENCOUNTER — Non-Acute Institutional Stay (SKILLED_NURSING_FACILITY): Payer: Medicare Other | Admitting: Nurse Practitioner

## 2015-11-13 ENCOUNTER — Encounter: Payer: Self-pay | Admitting: Nurse Practitioner

## 2015-11-13 DIAGNOSIS — K219 Gastro-esophageal reflux disease without esophagitis: Secondary | ICD-10-CM

## 2015-11-13 DIAGNOSIS — G20 Parkinson's disease: Secondary | ICD-10-CM

## 2015-11-13 DIAGNOSIS — D649 Anemia, unspecified: Secondary | ICD-10-CM

## 2015-11-13 DIAGNOSIS — R609 Edema, unspecified: Secondary | ICD-10-CM | POA: Diagnosis not present

## 2015-11-13 DIAGNOSIS — F4322 Adjustment disorder with anxiety: Secondary | ICD-10-CM | POA: Diagnosis not present

## 2015-11-13 DIAGNOSIS — K5900 Constipation, unspecified: Secondary | ICD-10-CM | POA: Diagnosis not present

## 2015-11-13 DIAGNOSIS — G20C Parkinsonism, unspecified: Secondary | ICD-10-CM

## 2015-11-13 DIAGNOSIS — R413 Other amnesia: Secondary | ICD-10-CM | POA: Diagnosis not present

## 2015-11-13 DIAGNOSIS — I10 Essential (primary) hypertension: Secondary | ICD-10-CM | POA: Diagnosis not present

## 2015-11-13 NOTE — Progress Notes (Signed)
Patient ID: Kimberly Silva, female   DOB: 1926-08-21, 81 y.o.   MRN: [id number]  Location:  SNF [facility] Provider:  Marlana Latus NP  Code Status:  DNR Goals of care: Advanced Directive information    Chief Complaint  Patient presents with  . Medical Management of Chronic Issues     HPI: Patient is a 81 y.o. female seen in the SNF at [facility] today for evaluation of depression, stable while on Sertraline 50mg , blood pressure is controlled on Hydrolazine 25mg  bid, Parkinson's, mild tremor in hands, not disabling while on Dinemet 25/100tid. Denied s/s of GERD.   Review of Systems  Constitutional: Negative for fever, chills and diaphoresis.       Gradual weight loss  HENT: Positive for hearing loss. Negative for congestion, ear pain and nosebleeds.   Eyes: Negative for pain, discharge and redness.  Respiratory: Negative for cough, shortness of breath and wheezing.   Cardiovascular: Positive for leg swelling. Negative for chest pain and palpitations.       Much improved, on trace edema in her left ankle seen  Gastrointestinal: Negative for nausea, vomiting, diarrhea and constipation.  Genitourinary: Positive for frequency. Negative for dysuria, urgency and flank pain.  Musculoskeletal: Positive for back pain. Negative for myalgias and neck pain.       Right lower back pain with walking or weight bearing on and off, positional.   Skin: Negative for rash.  Neurological: Positive for tremors. Negative for dizziness, seizures, weakness and headaches.  Psychiatric/Behavioral: Positive for memory loss. The patient is nervous/anxious.     Past Medical History  Diagnosis Date  . HYPERLIPIDEMIA 02/21/2009  . HYPOKALEMIA 08/17/2007  . ANEMIA, B12 DEFICIENCY 03/13/2009  . HYPERTENSION 05/14/2007  . Blood in stool 01/25/2008  . OSTEOARTHRITIS 05/14/2007  . OSTEOPENIA 05/14/2007    2007 hx of fosamax use   . TREMOR 07/08/2007  . FREQUENCY, URINARY 08/19/2008  . LUMBAR STRAIN 09/21/2007  .  Gastric polyp     panendo  5 2009  . History of ankle fracture   . Parkinson's disease   . Adenomatous colon polyp   . Lumbar compression fracture ([place])   . GERD (gastroesophageal reflux disease)   . Edema   . Iron deficiency anemia   . Anxiety   . Constipation   . Hemorrhoid   . Psoriasis   . Hearing loss   . Memory deficit 06/05/2015    Patient Active Problem List   Diagnosis Date Noted  . Candidiasis of skin 10/09/2015  . UTI (urinary tract infection) 08/10/2015  . Memory deficit 06/05/2015  . Generalized weakness 06/05/2015  . Anemia   . IDA (iron deficiency anemia) 03/26/2015  . Edema 12/22/2014  . Compression fracture of L3 lumbar vertebra ([place]) 12/14/2014  . GERD (gastroesophageal reflux disease) 12/14/2014  . Low back pain radiating to right leg 11/08/2014  . Complete heart block ([place]) 08/22/2014  . Syncope 07/19/2014  . Adjustment disorder with anxious mood 05/13/2014  . Left leg pain 11/01/2013  . Benign neoplasm of colon 07/19/2013  . Hemorrhage of rectum and anus 07/19/2013  . Chronic rhinosinusitis 02/03/2013  . Constipation 08/04/2012  . Parkinsonian tremor ([place]) 07/05/2012  . Decreased hearing 04/17/2011  . Family history of cancer 04/17/2011  . ANEMIA, B12 DEFICIENCY 03/13/2009  . Dyslipidemia 02/21/2009  . FREQUENCY, URINARY 08/19/2008  . Essential hypertension 05/14/2007  . OSTEOARTHRITIS 05/14/2007  . OSTEOPENIA 05/14/2007    No Known Allergies  Medications: Patient's Medications  New Prescriptions  No medications on file  Previous Medications   ASPIRIN 81 MG TABLET    Take 81 mg by mouth daily with breakfast.    CARBIDOPA-LEVODOPA (SINEMET IR) 25-100 MG PER TABLET    Take 1 tablet by mouth 3 (three) times daily.   CHOLECALCIFEROL (VITAMIN D3) 1000 UNITS CAPS    Take 1,000 Units by mouth daily with breakfast.    FAMOTIDINE (PEPCID) 20 MG TABLET    Take 20 mg by mouth 2 (two) times daily. 1 at bedtime to reduce stomach acid   FUROSEMIDE  (LASIX) 20 MG TABLET    Take 10 mg by mouth daily.   HYDRALAZINE (APRESOLINE) 25 MG TABLET    One twice daily to control blood pressure   LORAZEPAM (ATIVAN) 0.5 MG TABLET    Take 1 tablet (0.5 mg total) by mouth 2 (two) times daily.   ONDANSETRON (ZOFRAN) 4 MG TABLET    Take 4 mg by mouth every 8 (eight) hours as needed for nausea or vomiting.   POLYETHYL GLYCOL-PROPYL GLYCOL (SYSTANE) 0.4-0.3 % SOLN    Apply 1 drop to eye 2 (two) times daily.    POLYETHYLENE GLYCOL (MIRALAX / GLYCOLAX) PACKET    Take 17 g by mouth daily as needed for mild constipation.    POTASSIUM CHLORIDE SA (K-DUR,KLOR-CON) 20 MEQ TABLET    Take 20 mEq by mouth. Take one table daily for potassium   SERTRALINE (ZOLOFT) 50 MG TABLET    Take 50 mg by mouth daily. 1 daily for depression and anxiety   VITAMIN B-12 (CYANOCOBALAMIN) 1000 MCG TABLET    Take 1,000 mcg by mouth daily. One daily for B 12 supplement  Modified Medications   No medications on file  Discontinued Medications   No medications on file    Physical Exam: Filed Vitals:   11/13/15 1535  BP: 136/66  Pulse: 60  Temp: 98 F (36.7 C)  TempSrc: Tympanic  Resp: 18   There is no weight on file to calculate BMI.  Physical Exam  Constitutional: She is oriented to person, place, and time. She appears well-developed and well-nourished. No distress.  HENT:  Head: Normocephalic and atraumatic.  Right Ear: External ear normal.  Left Ear: External ear normal.  Nose: Nose normal.  Mouth/Throat: Oropharynx is clear and moist. No oropharyngeal exudate.  Eyes: Conjunctivae and EOM are normal. Pupils are equal, round, and reactive to light. Right eye exhibits no discharge. Left eye exhibits no discharge. No scleral icterus.  Neck: Normal range of motion. Neck supple. No JVD present. No tracheal deviation present. No thyromegaly present.  Cardiovascular: Normal rate, regular rhythm, normal heart sounds and intact distal pulses.  Exam reveals no friction rub.   No  murmur heard. Pulmonary/Chest: Effort normal and breath sounds normal. No stridor. No respiratory distress. She has no wheezes. She has no rales. She exhibits no tenderness.  Abdominal: Soft. Bowel sounds are normal. She exhibits no distension. There is no tenderness. There is no rebound and no guarding.  Musculoskeletal: Normal range of motion. She exhibits edema and tenderness.  Lower back pain refers to the right thigh and knee--positional, Tylenol was requested Only trace edema seen in the left ankle.   Lymphadenopathy:    She has no cervical adenopathy.  Neurological: She is alert and oriented to person, place, and time. She has normal reflexes. No cranial nerve deficit. She exhibits normal muscle tone. Coordination abnormal.  Skin: Skin is warm and dry. No rash noted. She is not diaphoretic. No  erythema. No pallor.  Beefy red areas in groins and intergluteal cleft  Psychiatric: She has a normal mood and affect. Her behavior is normal. Judgment and thought content normal.  Forgetful, stated she slept better    Labs reviewed: Basic Metabolic Panel:  Recent Labs  12/08/14 1421  12/11/14 0505 12/12/14 0453  02/17/15 1414  06/20/15 08/24/15 09/05/15  NA  --   < > 134* 132*  < > 134*  < > 138 139 139  K  --   < > 3.5 3.5  < > 3.3*  < > 3.8 3.2* 3.8  CL  --   < > 106 106  --  97  --   --   --   --   CO2  --   < > 24 25  --  31  --   --   --   --   GLUCOSE  --   < > 92 98  --  113*  --   --   --   --   BUN  --   < > 14 13  < > 23  < > 20 21 22*  CREATININE  --   < > 0.80 0.73  < > 1.06  < > 0.8 0.8 0.6  CALCIUM  --   < > 8.7 8.4  --  10.0  --   --   --   --   MG 1.7  --   --   --   --   --   --   --   --   --   < > = values in this interval not displayed.  Liver Function Tests:  Recent Labs  12/08/14 1036 12/09/14 0158  04/25/15 06/06/15 08/24/15  AST 11 9  < > 8* 9* 9*  ALT <5 <5  < > 8 8 3*  ALKPHOS 59 51  < > 45 48 46  BILITOT 0.5 0.3  --   --   --   --   PROT 6.6 5.5*   --   --   --   --   ALBUMIN 3.8 3.1*  --   --   --   --   < > = values in this interval not displayed.  CBC:  Recent Labs  12/08/14 1036  12/11/14 0505 12/12/14 0453  02/17/15 1414  06/20/15 08/24/15 10/19/15  WBC 7.0  < > 6.9 6.9  < > 8.6  < > 5.6 8.0 8.4  NEUTROABS 5.5  --   --   --   --  6.5  --   --   --   --   HGB 10.7*  < > 9.9* 9.2*  < > 10.2*  < > 10.2* 11.3* 11.0*  HCT 32.5*  < > 30.4* 28.4*  < > 31.3*  < > 32* 33* 34*  MCV 82.9  < > 84.0 83.8  --  76.5*  --   --   --   --   PLT 269  < > 226 208  < > 400.0  < > 279 274 261  < > = values in this interval not displayed.  Lab Results  Component Value Date   TSH 1.46 04/25/2015   No results found for: HGBA1C Lab Results  Component Value Date   CHOL 199 04/24/2012   HDL 69.30 04/24/2012   LDLCALC 105* 04/24/2012   LDLDIRECT 101.1 04/16/2011   TRIG 122.0 04/24/2012   CHOLHDL 3 04/24/2012  Significant Diagnostic Results since last visit: none  Patient Care Team: Estill Dooms, MD as PCP - General (Internal Medicine) Lafayette Dragon, MD (Gastroenterology) Penni Bombard, MD (Neurology) Rolm Bookbinder, MD as Attending Physician (Dermatology) Clent Jacks, MD (Ophthalmology) Belva Crome, MD as Consulting Physician (Cardiology) Evans Lance, MD as Consulting Physician (Cardiology) Reyne Dumas, MD as Consulting Physician (Internal Medicine) Suella Broad, MD as Consulting Physician (Physical Medicine and Rehabilitation) Laith Antonelli X, NP as Nurse Practitioner (Nurse Practitioner)  Assessment/Plan Problem List Items Addressed This Visit    Essential hypertension - Primary (Chronic)    Permissive blood pressure control, continue Hydralazine 25mg  bid since 10/05/15      Parkinsonian tremor (HCC) (Chronic)    Takes Sinemet 25/100 tid, flat affect,  no disabling tremor, SNF for care needs.      Adjustment disorder with anxious mood (Chronic)    Mood is stabilized, continue Sertraline 50mg  daily and prn  Lorazepam      GERD (gastroesophageal reflux disease) (Chronic)    Stable, continue Famotidine.       Edema (Chronic)    Not apparent, off Furosemide. Update BMP      Anemia (Chronic)    10/19/15 Hgb 11.0      Memory deficit (Chronic)    Mild, forgetful, continue SNF for care needs.       Constipation    Stable, continue Senokot S II po qhs, Colace bid,  and started 03/06/15 prn MiaLax and MOM          Family/ staff Communication: observe the patient.   Labs/tests ordered: BMP  ManXie Yaeli Hartung NP [facility] [address], [place] [address] On Call:  [phone] & follow prompts after 5pm & weekends Office Phone:  [phone] Office Fax:  [phone]

## 2015-11-15 NOTE — Assessment & Plan Note (Signed)
Takes Sinemet 25/100 tid, flat affect,  no disabling tremor, SNF for care needs.  

## 2015-11-15 NOTE — Assessment & Plan Note (Signed)
Not apparent, off Furosemide. Update BMP

## 2015-11-15 NOTE — Assessment & Plan Note (Signed)
10/19/15 Hgb 11.0

## 2015-11-15 NOTE — Assessment & Plan Note (Signed)
Mood is stabilized, continue Sertraline 50mg daily and prn Lorazepam 

## 2015-11-15 NOTE — Assessment & Plan Note (Signed)
Mild, forgetful, continue SNF for care needs.  

## 2015-11-15 NOTE — Assessment & Plan Note (Signed)
Stable, continue Senokot S II po qhs, Colace bid,  and started 03/06/15 prn MiaLax and MOM 

## 2015-11-15 NOTE — Assessment & Plan Note (Signed)
Stable, continue Famotidine.  

## 2015-11-15 NOTE — Assessment & Plan Note (Signed)
Permissive blood pressure control, continue Hydralazine 25mg  bid since 10/05/15

## 2015-11-16 LAB — BASIC METABOLIC PANEL
BUN: 21 mg/dL (ref 4–21)
CREATININE: 0.7 mg/dL (ref 0.5–1.1)
Glucose: 87 mg/dL
POTASSIUM: 3.9 mmol/L (ref 3.4–5.3)
Sodium: 138 mmol/L (ref 137–147)

## 2015-11-20 ENCOUNTER — Other Ambulatory Visit: Payer: Self-pay | Admitting: Nurse Practitioner

## 2015-12-06 ENCOUNTER — Encounter: Payer: Self-pay | Admitting: Nurse Practitioner

## 2015-12-06 ENCOUNTER — Non-Acute Institutional Stay (SKILLED_NURSING_FACILITY): Payer: Medicare Other | Admitting: Nurse Practitioner

## 2015-12-06 DIAGNOSIS — F4322 Adjustment disorder with anxiety: Secondary | ICD-10-CM

## 2015-12-06 DIAGNOSIS — I10 Essential (primary) hypertension: Secondary | ICD-10-CM | POA: Diagnosis not present

## 2015-12-06 DIAGNOSIS — K219 Gastro-esophageal reflux disease without esophagitis: Secondary | ICD-10-CM

## 2015-12-06 DIAGNOSIS — G20 Parkinson's disease: Secondary | ICD-10-CM

## 2015-12-06 DIAGNOSIS — G20C Parkinsonism, unspecified: Secondary | ICD-10-CM

## 2015-12-06 DIAGNOSIS — R413 Other amnesia: Secondary | ICD-10-CM | POA: Diagnosis not present

## 2015-12-06 DIAGNOSIS — D518 Other vitamin B12 deficiency anemias: Secondary | ICD-10-CM

## 2015-12-06 DIAGNOSIS — R609 Edema, unspecified: Secondary | ICD-10-CM | POA: Diagnosis not present

## 2015-12-06 DIAGNOSIS — K5900 Constipation, unspecified: Secondary | ICD-10-CM | POA: Diagnosis not present

## 2015-12-06 NOTE — Assessment & Plan Note (Signed)
Takes Sinemet 25/100 tid, flat affect,  no disabling tremor, SNF for care needs.  

## 2015-12-06 NOTE — Assessment & Plan Note (Signed)
Mild, forgetful, continue SNF for care needs.  

## 2015-12-06 NOTE — Assessment & Plan Note (Signed)
Permissive blood pressure control, continue Hydralazine 25mg  bid since 10/05/15

## 2015-12-06 NOTE — Progress Notes (Signed)
Patient ID: Kimberly Silva, female   DOB: [birth year], 81 y.o.   MRN: [id number]  Location:  SNF [facility] Provider:  Marlana Latus NP  Code Status:  DNR Goals of care: Advanced Directive information    Chief Complaint  Patient presents with  . Medical Management of Chronic Issues     HPI: Patient is a 81 y.o. female seen in the SNF at [facility] today for evaluation of depression, stable while on Sertraline 50mg , blood pressure is controlled on Hydrolazine 25mg  bid, Parkinson's, mild tremor in hands, not disabling while on Sinemet 25/100tid.    Review of Systems  Constitutional: Negative for fever, chills and diaphoresis.       Gradual weight loss  HENT: Positive for hearing loss. Negative for congestion, ear pain and nosebleeds.   Eyes: Negative for pain, discharge and redness.  Respiratory: Negative for cough, shortness of breath and wheezing.   Cardiovascular: Positive for leg swelling. Negative for chest pain and palpitations.       Much improved, on trace edema in her left ankle seen  Gastrointestinal: Negative for nausea, vomiting, diarrhea and constipation.  Genitourinary: Positive for frequency. Negative for dysuria, urgency and flank pain.  Musculoskeletal: Positive for back pain. Negative for myalgias and neck pain.       Right lower back pain with walking or weight bearing on and off, positional.   Skin: Negative for rash.  Neurological: Positive for tremors. Negative for dizziness, seizures, weakness and headaches.  Psychiatric/Behavioral: Positive for memory loss. The patient is nervous/anxious.     Past Medical History  Diagnosis Date  . HYPERLIPIDEMIA 02/21/2009  . HYPOKALEMIA 08/17/2007  . ANEMIA, B12 DEFICIENCY 03/13/2009  . HYPERTENSION 05/14/2007  . Blood in stool 01/25/2008  . OSTEOARTHRITIS 05/14/2007  . OSTEOPENIA 05/14/2007    2007 hx of fosamax use   . TREMOR 07/08/2007  . FREQUENCY, URINARY 08/19/2008  . LUMBAR STRAIN 09/21/2007  . Gastric polyp    panendo  5 2009  . History of ankle fracture   . Parkinson's disease   . Adenomatous colon polyp   . Lumbar compression fracture ([place])   . GERD (gastroesophageal reflux disease)   . Edema   . Iron deficiency anemia   . Anxiety   . Constipation   . Hemorrhoid   . Psoriasis   . Hearing loss   . Memory deficit 06/05/2015    Patient Active Problem List   Diagnosis Date Noted  . Candidiasis of skin 10/09/2015  . UTI (urinary tract infection) 08/10/2015  . Memory deficit 06/05/2015  . Generalized weakness 06/05/2015  . Anemia   . IDA (iron deficiency anemia) 03/26/2015  . Edema 12/22/2014  . Compression fracture of L3 lumbar vertebra ([place]) 12/14/2014  . GERD (gastroesophageal reflux disease) 12/14/2014  . Low back pain radiating to right leg 11/08/2014  . Complete heart block ([place]) 08/22/2014  . Syncope 07/19/2014  . Adjustment disorder with anxious mood 05/13/2014  . Left leg pain 11/01/2013  . Benign neoplasm of colon 07/19/2013  . Hemorrhage of rectum and anus 07/19/2013  . Chronic rhinosinusitis 02/03/2013  . Constipation 08/04/2012  . Parkinsonian tremor ([place]) 07/05/2012  . Decreased hearing 04/17/2011  . Family history of cancer 04/17/2011  . ANEMIA, B12 DEFICIENCY 03/13/2009  . Dyslipidemia 02/21/2009  . FREQUENCY, URINARY 08/19/2008  . Essential hypertension 05/14/2007  . OSTEOARTHRITIS 05/14/2007  . OSTEOPENIA 05/14/2007    No Known Allergies  Medications: Patient's Medications  New Prescriptions   No medications on file  Previous Medications   ASPIRIN 81 MG TABLET    Take 81 mg by mouth daily with breakfast.    CARBIDOPA-LEVODOPA (SINEMET IR) 25-100 MG PER TABLET    Take 1 tablet by mouth 3 (three) times daily.   CHOLECALCIFEROL (VITAMIN D3) 1000 UNITS CAPS    Take 1,000 Units by mouth daily with breakfast.    FAMOTIDINE (PEPCID) 20 MG TABLET    Take 20 mg by mouth 2 (two) times daily. 1 at bedtime to reduce stomach acid   FUROSEMIDE (LASIX) 20 MG TABLET     Take 10 mg by mouth daily.   HYDRALAZINE (APRESOLINE) 25 MG TABLET    One twice daily to control blood pressure   LORAZEPAM (ATIVAN) 0.5 MG TABLET    Take 1 tablet (0.5 mg total) by mouth 2 (two) times daily.   ONDANSETRON (ZOFRAN) 4 MG TABLET    Take 4 mg by mouth every 8 (eight) hours as needed for nausea or vomiting.   POLYETHYL GLYCOL-PROPYL GLYCOL (SYSTANE) 0.4-0.3 % SOLN    Apply 1 drop to eye 2 (two) times daily.    POLYETHYLENE GLYCOL (MIRALAX / GLYCOLAX) PACKET    Take 17 g by mouth daily as needed for mild constipation.    POTASSIUM CHLORIDE SA (K-DUR,KLOR-CON) 20 MEQ TABLET    Take 20 mEq by mouth. Take one table daily for potassium   SERTRALINE (ZOLOFT) 50 MG TABLET    Take 50 mg by mouth daily. 1 daily for depression and anxiety   VITAMIN B-12 (CYANOCOBALAMIN) 1000 MCG TABLET    Take 1,000 mcg by mouth daily. One daily for B 12 supplement  Modified Medications   No medications on file  Discontinued Medications   No medications on file    Physical Exam: Filed Vitals:   12/06/15 1107  BP: 142/84  Pulse: 60  Temp: 98.4 F (36.9 C)  TempSrc: Tympanic  Resp: 20   There is no weight on file to calculate BMI.  Physical Exam  Constitutional: She is oriented to person, place, and time. She appears well-developed and well-nourished. No distress.  HENT:  Head: Normocephalic and atraumatic.  Right Ear: External ear normal.  Left Ear: External ear normal.  Nose: Nose normal.  Mouth/Throat: Oropharynx is clear and moist. No oropharyngeal exudate.  Eyes: Conjunctivae and EOM are normal. Pupils are equal, round, and reactive to light. Right eye exhibits no discharge. Left eye exhibits no discharge. No scleral icterus.  Neck: Normal range of motion. Neck supple. No JVD present. No tracheal deviation present. No thyromegaly present.  Cardiovascular: Normal rate, regular rhythm, normal heart sounds and intact distal pulses.  Exam reveals no friction rub.   No murmur  heard. Pulmonary/Chest: Effort normal and breath sounds normal. No stridor. No respiratory distress. She has no wheezes. She has no rales. She exhibits no tenderness.  Abdominal: Soft. Bowel sounds are normal. She exhibits no distension. There is no tenderness. There is no rebound and no guarding.  Musculoskeletal: Normal range of motion. She exhibits edema and tenderness.  Lower back pain refers to the right thigh and knee--positional, Tylenol was requested Only trace edema seen in the left ankle.   Lymphadenopathy:    She has no cervical adenopathy.  Neurological: She is alert and oriented to person, place, and time. She has normal reflexes. No cranial nerve deficit. She exhibits normal muscle tone. Coordination abnormal.  Skin: Skin is warm and dry. No rash noted. She is not diaphoretic. No erythema. No pallor.  Beefy  red areas in groins and intergluteal cleft  Psychiatric: She has a normal mood and affect. Her behavior is normal. Judgment and thought content normal.  Forgetful, stated she slept better    Labs reviewed: Basic Metabolic Panel:  Recent Labs  12/08/14 1421  12/11/14 0505 12/12/14 0453  02/17/15 1414  08/24/15 09/05/15 11/16/15  NA  --   < > 134* 132*  < > 134*  < > 139 139 138  K  --   < > 3.5 3.5  < > 3.3*  < > 3.2* 3.8 3.9  CL  --   < > 106 106  --  97  --   --   --   --   CO2  --   < > 24 25  --  31  --   --   --   --   GLUCOSE  --   < > 92 98  --  113*  --   --   --   --   BUN  --   < > 14 13  < > 23  < > 21 22* 21  CREATININE  --   < > 0.80 0.73  < > 1.06  < > 0.8 0.6 0.7  CALCIUM  --   < > 8.7 8.4  --  10.0  --   --   --   --   MG 1.7  --   --   --   --   --   --   --   --   --   < > = values in this interval not displayed.  Liver Function Tests:  Recent Labs  12/08/14 1036 12/09/14 0158  04/25/15 06/06/15 08/24/15  AST 11 9  < > 8* 9* 9*  ALT <5 <5  < > 8 8 3*  ALKPHOS 59 51  < > 45 48 46  BILITOT 0.5 0.3  --   --   --   --   PROT 6.6 5.5*  --   --    --   --   ALBUMIN 3.8 3.1*  --   --   --   --   < > = values in this interval not displayed.  CBC:  Recent Labs  12/08/14 1036  12/11/14 0505 12/12/14 0453  02/17/15 1414  06/20/15 08/24/15 10/19/15  WBC 7.0  < > 6.9 6.9  < > 8.6  < > 5.6 8.0 8.4  NEUTROABS 5.5  --   --   --   --  6.5  --   --   --   --   HGB 10.7*  < > 9.9* 9.2*  < > 10.2*  < > 10.2* 11.3* 11.0*  HCT 32.5*  < > 30.4* 28.4*  < > 31.3*  < > 32* 33* 34*  MCV 82.9  < > 84.0 83.8  --  76.5*  --   --   --   --   PLT 269  < > 226 208  < > 400.0  < > 279 274 261  < > = values in this interval not displayed.  Lab Results  Component Value Date   TSH 1.46 04/25/2015   No results found for: HGBA1C Lab Results  Component Value Date   CHOL 199 04/24/2012   HDL 69.30 04/24/2012   LDLCALC 105* 04/24/2012   LDLDIRECT 101.1 04/16/2011   TRIG 122.0 04/24/2012   CHOLHDL 3 04/24/2012    Significant Diagnostic  Results since last visit: none  Patient Care Team: Estill Dooms, MD as PCP - General (Internal Medicine) Lafayette Dragon, MD (Gastroenterology) Penni Bombard, MD (Neurology) Rolm Bookbinder, MD as Attending Physician (Dermatology) Clent Jacks, MD (Ophthalmology) Belva Crome, MD as Consulting Physician (Cardiology) Evans Lance, MD as Consulting Physician (Cardiology) Reyne Dumas, MD as Consulting Physician (Internal Medicine) Suella Broad, MD as Consulting Physician (Physical Medicine and Rehabilitation) Nasri Boakye X, NP as Nurse Practitioner (Nurse Practitioner)  Assessment/Plan Problem List Items Addressed This Visit    Adjustment disorder with anxious mood (Chronic)    Mood is stabilized, continue Sertraline 50mg  daily and prn Lorazepam      ANEMIA, B12 DEFICIENCY    10/19/15 Hgb 11.0      Constipation    Stable, continue Senokot S II po qhs, Colace bid,  and started 03/06/15 prn MiaLax and MOM      Edema (Chronic)    Not apparent, off Furosemide      Essential hypertension - Primary  (Chronic)    Permissive blood pressure control, continue Hydralazine 25mg  bid since 10/05/15      GERD (gastroesophageal reflux disease) (Chronic)    Stable, continue Famotidine.       Memory deficit (Chronic)    Mild, forgetful, continue SNF for care needs.       Parkinsonian tremor (HCC) (Chronic)    Takes Sinemet 25/100 tid, flat affect,  no disabling tremor, SNF for care needs.          Family/ staff Communication: observe the patient.   Labs/tests ordered: none  ManXie Makayla Lanter NP [facility] [address], [place] [address] On Call:  [phone] & follow prompts after 5pm & weekends Office Phone:  [phone] Office Fax:  [phone]

## 2015-12-06 NOTE — Assessment & Plan Note (Signed)
Mood is stabilized, continue Sertraline 50mg daily and prn Lorazepam 

## 2015-12-06 NOTE — Assessment & Plan Note (Signed)
Stable, continue Famotidine.  

## 2015-12-06 NOTE — Assessment & Plan Note (Signed)
Not apparent, off Furosemide.  

## 2015-12-06 NOTE — Assessment & Plan Note (Signed)
10/19/15 Hgb 11.0

## 2015-12-06 NOTE — Assessment & Plan Note (Signed)
Stable, continue Senokot S II po qhs, Colace bid,  and started 03/06/15 prn MiaLax and MOM 

## 2015-12-07 LAB — BASIC METABOLIC PANEL
BUN: 21 mg/dL (ref 4–21)
Creatinine: 0.6 mg/dL (ref 0.5–1.1)
GLUCOSE: 83 mg/dL
Potassium: 3.7 mmol/L (ref 3.4–5.3)
SODIUM: 139 mmol/L (ref 137–147)

## 2015-12-12 ENCOUNTER — Other Ambulatory Visit: Payer: Self-pay | Admitting: Nurse Practitioner

## 2015-12-19 LAB — CBC AND DIFFERENTIAL
HEMATOCRIT: 35 % — AB (ref 36–46)
HEMOGLOBIN: 11.6 g/dL — AB (ref 12.0–16.0)
Platelets: 244 10*3/uL (ref 150–399)
WBC: 11.3 10*3/mL

## 2015-12-19 LAB — HEPATIC FUNCTION PANEL
ALT: 3 U/L — AB (ref 7–35)
AST: 8 U/L — AB (ref 13–35)
Alkaline Phosphatase: 43 U/L (ref 25–125)
BILIRUBIN, TOTAL: 0.5 mg/dL

## 2015-12-19 LAB — BASIC METABOLIC PANEL
BUN: 17 mg/dL (ref 4–21)
Creatinine: 0.7 mg/dL (ref 0.5–1.1)
GLUCOSE: 80 mg/dL
Potassium: 2.9 mmol/L — AB (ref 3.4–5.3)
SODIUM: 137 mmol/L (ref 137–147)

## 2015-12-20 ENCOUNTER — Non-Acute Institutional Stay (SKILLED_NURSING_FACILITY): Payer: Medicare Other | Admitting: Nurse Practitioner

## 2015-12-20 ENCOUNTER — Encounter: Payer: Self-pay | Admitting: Nurse Practitioner

## 2015-12-20 ENCOUNTER — Other Ambulatory Visit: Payer: Self-pay | Admitting: Nurse Practitioner

## 2015-12-20 DIAGNOSIS — K219 Gastro-esophageal reflux disease without esophagitis: Secondary | ICD-10-CM | POA: Diagnosis not present

## 2015-12-20 DIAGNOSIS — G20C Parkinsonism, unspecified: Secondary | ICD-10-CM

## 2015-12-20 DIAGNOSIS — I10 Essential (primary) hypertension: Secondary | ICD-10-CM

## 2015-12-20 DIAGNOSIS — G20 Parkinson's disease: Secondary | ICD-10-CM | POA: Diagnosis not present

## 2015-12-20 DIAGNOSIS — R413 Other amnesia: Secondary | ICD-10-CM

## 2015-12-20 DIAGNOSIS — K5900 Constipation, unspecified: Secondary | ICD-10-CM

## 2015-12-20 DIAGNOSIS — F4322 Adjustment disorder with anxiety: Secondary | ICD-10-CM

## 2015-12-20 DIAGNOSIS — R609 Edema, unspecified: Secondary | ICD-10-CM

## 2015-12-20 DIAGNOSIS — D509 Iron deficiency anemia, unspecified: Secondary | ICD-10-CM

## 2015-12-20 DIAGNOSIS — E876 Hypokalemia: Secondary | ICD-10-CM | POA: Diagnosis not present

## 2015-12-20 NOTE — Assessment & Plan Note (Signed)
Not apparent, off Furosemide.  

## 2015-12-20 NOTE — Assessment & Plan Note (Signed)
Stable, continue Senokot S II po qhs, Colace bid,  and started 03/06/15 prn MiaLax and MOM 

## 2015-12-20 NOTE — Assessment & Plan Note (Signed)
Permissive blood pressure control, continue Hydralazine 25mg  bid since 10/05/15

## 2015-12-20 NOTE — Progress Notes (Signed)
Patient ID: Kimberly Silva, female   DOB: [birth year], 82 y.o.   MRN: [id number]  Location:  SNF [facility] Provider:  Marlana Latus NP  Code Status:  DNR Goals of care: Advanced Directive information    Chief Complaint  Patient presents with  . Medical Management of Chronic Issues  . Acute Visit    hypokalemia     HPI: Patient is a 82 y.o. female seen in the SNF at [facility] today for evaluation of hypokalemia, last serum K 2.9, dropped from 3.2 previously, the patient refuses Kcl po mostly, depression, stable while on Sertraline 50mg , blood pressure is controlled on Hydrolazine 25mg  bid, Parkinson's, mild tremor in hands, not disabling while on Sinemet 25/100tid.    Review of Systems  Constitutional: Positive for malaise/fatigue. Negative for fever, chills and diaphoresis.       Gradual weight loss  HENT: Positive for hearing loss. Negative for congestion, ear pain and nosebleeds.   Eyes: Negative for pain, discharge and redness.  Respiratory: Negative for cough, shortness of breath and wheezing.   Cardiovascular: Positive for leg swelling. Negative for chest pain and palpitations.       Much improved, on trace edema in her left ankle seen  Gastrointestinal: Negative for nausea, vomiting, diarrhea and constipation.  Genitourinary: Positive for frequency. Negative for dysuria, urgency and flank pain.  Musculoskeletal: Positive for back pain. Negative for myalgias and neck pain.       Right lower back pain with walking or weight bearing on and off, positional.   Skin: Negative for rash.  Neurological: Positive for tremors. Negative for dizziness, seizures, weakness and headaches.  Psychiatric/Behavioral: Positive for memory loss. The patient is nervous/anxious.     Past Medical History  Diagnosis Date  . HYPERLIPIDEMIA 02/21/2009  . HYPOKALEMIA 08/17/2007  . ANEMIA, B12 DEFICIENCY 03/13/2009  . HYPERTENSION 05/14/2007  . Blood in stool 01/25/2008  . OSTEOARTHRITIS 05/14/2007  .  OSTEOPENIA 05/14/2007    2007 hx of fosamax use   . TREMOR 07/08/2007  . FREQUENCY, URINARY 08/19/2008  . LUMBAR STRAIN 09/21/2007  . Gastric polyp     panendo  5 2009  . History of ankle fracture   . Parkinson's disease   . Adenomatous colon polyp   . Lumbar compression fracture ([place])   . GERD (gastroesophageal reflux disease)   . Edema   . Iron deficiency anemia   . Anxiety   . Constipation   . Hemorrhoid   . Psoriasis   . Hearing loss   . Memory deficit 06/05/2015    Patient Active Problem List   Diagnosis Date Noted  . Hypokalemia 12/20/2015  . Candidiasis of skin 10/09/2015  . UTI (urinary tract infection) 08/10/2015  . Memory deficit 06/05/2015  . Generalized weakness 06/05/2015  . Anemia   . IDA (iron deficiency anemia) 03/26/2015  . Edema 12/22/2014  . Compression fracture of L3 lumbar vertebra ([place]) 12/14/2014  . GERD (gastroesophageal reflux disease) 12/14/2014  . Low back pain radiating to right leg 11/08/2014  . Complete heart block ([place]) 08/22/2014  . Syncope 07/19/2014  . Adjustment disorder with anxious mood 05/13/2014  . Left leg pain 11/01/2013  . Benign neoplasm of colon 07/19/2013  . Hemorrhage of rectum and anus 07/19/2013  . Chronic rhinosinusitis 02/03/2013  . Constipation 08/04/2012  . Parkinsonian tremor ([place]) 07/05/2012  . Decreased hearing 04/17/2011  . Family history of cancer 04/17/2011  . ANEMIA, B12 DEFICIENCY 03/13/2009  . Dyslipidemia 02/21/2009  . FREQUENCY, URINARY 08/19/2008  .  Essential hypertension 05/14/2007  . OSTEOARTHRITIS 05/14/2007  . OSTEOPENIA 05/14/2007    No Known Allergies  Medications: Patient's Medications  New Prescriptions   No medications on file  Previous Medications   ASPIRIN 81 MG TABLET    Take 81 mg by mouth daily with breakfast.    CARBIDOPA-LEVODOPA (SINEMET IR) 25-100 MG PER TABLET    Take 1 tablet by mouth 3 (three) times daily.   CHOLECALCIFEROL (VITAMIN D3) 1000 UNITS CAPS    Take 1,000 Units by  mouth daily with breakfast.    FAMOTIDINE (PEPCID) 20 MG TABLET    Take 20 mg by mouth 2 (two) times daily. 1 at bedtime to reduce stomach acid   FUROSEMIDE (LASIX) 20 MG TABLET    Take 10 mg by mouth daily.   HYDRALAZINE (APRESOLINE) 25 MG TABLET    One twice daily to control blood pressure   LORAZEPAM (ATIVAN) 0.5 MG TABLET    Take 1 tablet (0.5 mg total) by mouth 2 (two) times daily.   ONDANSETRON (ZOFRAN) 4 MG TABLET    Take 4 mg by mouth every 8 (eight) hours as needed for nausea or vomiting.   POLYETHYL GLYCOL-PROPYL GLYCOL (SYSTANE) 0.4-0.3 % SOLN    Apply 1 drop to eye 2 (two) times daily.    POLYETHYLENE GLYCOL (MIRALAX / GLYCOLAX) PACKET    Take 17 g by mouth daily as needed for mild constipation.    POTASSIUM CHLORIDE SA (K-DUR,KLOR-CON) 20 MEQ TABLET    Take 20 mEq by mouth. Take one table daily for potassium   SERTRALINE (ZOLOFT) 50 MG TABLET    Take 50 mg by mouth daily. 1 daily for depression and anxiety   VITAMIN B-12 (CYANOCOBALAMIN) 1000 MCG TABLET    Take 1,000 mcg by mouth daily. One daily for B 12 supplement  Modified Medications   No medications on file  Discontinued Medications   No medications on file    Physical Exam: Filed Vitals:   12/20/15 1325  BP: 140/80  Pulse: 90  Temp: 99.4 F (37.4 C)  TempSrc: Tympanic  Resp: 20   There is no weight on file to calculate BMI.  Physical Exam  Constitutional: She is oriented to person, place, and time. She appears well-developed and well-nourished. No distress.  HENT:  Head: Normocephalic and atraumatic.  Right Ear: External ear normal.  Left Ear: External ear normal.  Nose: Nose normal.  Mouth/Throat: Oropharynx is clear and moist. No oropharyngeal exudate.  Eyes: Conjunctivae and EOM are normal. Pupils are equal, round, and reactive to light. Right eye exhibits no discharge. Left eye exhibits no discharge. No scleral icterus.  Neck: Normal range of motion. Neck supple. No JVD present. No tracheal deviation  present. No thyromegaly present.  Cardiovascular: Normal rate, regular rhythm, normal heart sounds and intact distal pulses.  Exam reveals no friction rub.   No murmur heard. Pulmonary/Chest: Effort normal and breath sounds normal. No stridor. No respiratory distress. She has no wheezes. She has no rales. She exhibits no tenderness.  Abdominal: Soft. Bowel sounds are normal. She exhibits no distension. There is no tenderness. There is no rebound and no guarding.  Musculoskeletal: Normal range of motion. She exhibits edema and tenderness.  Lower back pain refers to the right thigh and knee--positional, Tylenol was requested Only trace edema seen in the left ankle.   Lymphadenopathy:    She has no cervical adenopathy.  Neurological: She is alert and oriented to person, place, and time. She has normal reflexes.  No cranial nerve deficit. She exhibits normal muscle tone. Coordination abnormal.  Skin: Skin is warm and dry. No rash noted. She is not diaphoretic. No erythema. No pallor.  Beefy red areas in groins and intergluteal cleft  Psychiatric: She has a normal mood and affect. Her behavior is normal. Judgment and thought content normal.  Forgetful, stated she slept better    Labs reviewed: Basic Metabolic Panel:  Recent Labs  02/17/15 1414  11/16/15 12/07/15 12/19/15  NA 134*  < > 138 139 137  K 3.3*  < > 3.9 3.7 2.9*  CL 97  --   --   --   --   CO2 31  --   --   --   --   GLUCOSE 113*  --   --   --   --   BUN 23  < > 21 21 17   CREATININE 1.06  < > 0.7 0.6 0.7  CALCIUM 10.0  --   --   --   --   < > = values in this interval not displayed.  Liver Function Tests:  Recent Labs  06/06/15 08/24/15 12/19/15  AST 9* 9* 8*  ALT 8 3* 3*  ALKPHOS 48 46 43    CBC:  Recent Labs  02/17/15 1414  08/24/15 10/19/15 12/19/15  WBC 8.6  < > 8.0 8.4 11.3  NEUTROABS 6.5  --   --   --   --   HGB 10.2*  < > 11.3* 11.0* 11.6*  HCT 31.3*  < > 33* 34* 35*  MCV 76.5*  --   --   --   --   PLT  400.0  < > 274 261 244  < > = values in this interval not displayed.  Lab Results  Component Value Date   TSH 1.46 04/25/2015   No results found for: HGBA1C Lab Results  Component Value Date   CHOL 199 04/24/2012   HDL 69.30 04/24/2012   LDLCALC 105* 04/24/2012   LDLDIRECT 101.1 04/16/2011   TRIG 122.0 04/24/2012   CHOLHDL 3 04/24/2012    Significant Diagnostic Results since last visit: none  Patient Care Team: Estill Dooms, MD as PCP - General (Internal Medicine) Lafayette Dragon, MD (Gastroenterology) Penni Bombard, MD (Neurology) Rolm Bookbinder, MD as Attending Physician (Dermatology) Clent Jacks, MD (Ophthalmology) Belva Crome, MD as Consulting Physician (Cardiology) Evans Lance, MD as Consulting Physician (Cardiology) Reyne Dumas, MD as Consulting Physician (Internal Medicine) Suella Broad, MD as Consulting Physician (Physical Medicine and Rehabilitation) Darran Gabay X, NP as Nurse Practitioner (Nurse Practitioner)  Assessment/Plan Problem List Items Addressed This Visit    Parkinsonian tremor (HCC) (Chronic)    Takes Sinemet 25/100 tid, flat affect,  no disabling tremor, SNF for care needs.      Memory deficit (Chronic)    Mild, forgetful, continue SNF for care needs.       IDA (iron deficiency anemia)    12/19/15 Hgb 11.6       Hypokalemia - Primary    12/19/15 K 2.9, the patient refuses taking Kcl, may repeat serum K       GERD (gastroesophageal reflux disease) (Chronic)    C/o nausea, 12/19/15 Amylase 24, lipase 14, continue Famotidine, declined GI referral, [place], KUB, or CT abd. Adding Zofran 5mg  1hr ac meals.       Essential hypertension (Chronic)    Permissive blood pressure control, continue Hydralazine 25mg  bid since 10/05/15  Edema (Chronic)    Not apparent, off Furosemide      Constipation    Stable, continue Senokot S II po qhs, Colace bid,  and started 03/06/15 prn MiaLax and MOM      Adjustment disorder with anxious mood (Chronic)     Mood is stabilized, continue Sertraline 50mg  daily and prn Lorazepam          Family/ staff Communication: observe the patient.   Labs/tests ordered: serum K one week  [facility] Eliannah Hinde NP [facility] [address], [place] [address] On Call:  [phone] & follow prompts after 5pm & weekends Office Phone:  [phone] Office Fax:  [phone]

## 2015-12-20 NOTE — Assessment & Plan Note (Signed)
Mood is stabilized, continue Sertraline 50mg daily and prn Lorazepam 

## 2015-12-20 NOTE — Assessment & Plan Note (Signed)
12/19/15 K 2.9, the patient refuses taking Kcl, may repeat serum K

## 2015-12-20 NOTE — Assessment & Plan Note (Signed)
12/19/15 Hgb 11.6

## 2015-12-20 NOTE — Assessment & Plan Note (Signed)
Takes Sinemet 25/100 tid, flat affect,  no disabling tremor, SNF for care needs.  

## 2015-12-20 NOTE — Assessment & Plan Note (Signed)
Mild, forgetful, continue SNF for care needs.  

## 2015-12-20 NOTE — Assessment & Plan Note (Addendum)
C/o nausea, 12/19/15 Amylase 24, lipase 14, continue Famotidine, declined GI referral, [place], KUB, or CT abd. Adding Zofran 5mg  1hr ac meals.

## 2015-12-26 LAB — BASIC METABOLIC PANEL: POTASSIUM: 3.5 mmol/L (ref 3.4–5.3)

## 2015-12-27 ENCOUNTER — Other Ambulatory Visit: Payer: Self-pay | Admitting: Nurse Practitioner

## 2015-12-27 DIAGNOSIS — E876 Hypokalemia: Secondary | ICD-10-CM

## 2016-01-17 ENCOUNTER — Encounter: Payer: Self-pay | Admitting: Nurse Practitioner

## 2016-01-17 ENCOUNTER — Non-Acute Institutional Stay (SKILLED_NURSING_FACILITY): Payer: Medicare Other | Admitting: Nurse Practitioner

## 2016-01-17 DIAGNOSIS — K219 Gastro-esophageal reflux disease without esophagitis: Secondary | ICD-10-CM

## 2016-01-17 DIAGNOSIS — G20C Parkinsonism, unspecified: Secondary | ICD-10-CM

## 2016-01-17 DIAGNOSIS — F4322 Adjustment disorder with anxiety: Secondary | ICD-10-CM | POA: Diagnosis not present

## 2016-01-17 DIAGNOSIS — R413 Other amnesia: Secondary | ICD-10-CM

## 2016-01-17 DIAGNOSIS — I10 Essential (primary) hypertension: Secondary | ICD-10-CM

## 2016-01-17 DIAGNOSIS — E876 Hypokalemia: Secondary | ICD-10-CM | POA: Diagnosis not present

## 2016-01-17 DIAGNOSIS — D509 Iron deficiency anemia, unspecified: Secondary | ICD-10-CM | POA: Diagnosis not present

## 2016-01-17 DIAGNOSIS — K5900 Constipation, unspecified: Secondary | ICD-10-CM

## 2016-01-17 DIAGNOSIS — R609 Edema, unspecified: Secondary | ICD-10-CM | POA: Diagnosis not present

## 2016-01-17 DIAGNOSIS — S32030S Wedge compression fracture of third lumbar vertebra, sequela: Secondary | ICD-10-CM

## 2016-01-17 DIAGNOSIS — G20 Parkinson's disease: Secondary | ICD-10-CM | POA: Diagnosis not present

## 2016-01-17 NOTE — Assessment & Plan Note (Signed)
Stable, continue Senokot S II po qhs, Colace bid,  and started 03/06/15 prn MiaLax and MOM 

## 2016-01-17 NOTE — Progress Notes (Signed)
Patient ID: Kimberly Silva, female   DOB: Mar 17, 1926, 82 y.o.   MRN: [id number]

## 2016-01-17 NOTE — Progress Notes (Signed)
Patient ID: Kimberly Silva, female   DOB: 1926-05-27, 82 y.o.   MRN: [id number]  Location:  SNF [facility] Provider:  Marlana Latus NP  Code Status:  DNR Goals of care: Advanced Directive information Does patient have an advance directive?: Yes, Type of Advance Directive: [place];Out of facility DNR (pink MOST or yellow form)  Chief Complaint  Patient presents with  . Medical Management of Chronic Issues    Routine Visit     HPI: Patient is a 82 y.o. female seen in the SNF at [facility] today for evaluation of hypokalemia, serum 3.5 12/26/15, taking Kcl, Hx of depression, stable while on Sertraline 50mg , blood pressure is controlled on Hydrolazine 25mg  bid, Parkinson's, mild tremor in hands, not disabling while on Sinemet 25/100tid.    Review of Systems  Constitutional: Positive for malaise/fatigue. Negative for fever, chills and diaphoresis.       Gradual weight loss  HENT: Positive for hearing loss. Negative for congestion, ear pain and nosebleeds.   Eyes: Negative for pain, discharge and redness.  Respiratory: Negative for cough, shortness of breath and wheezing.   Cardiovascular: Positive for leg swelling. Negative for chest pain and palpitations.       Much improved, on trace edema in her left ankle seen  Gastrointestinal: Negative for nausea, vomiting, diarrhea and constipation.  Genitourinary: Positive for frequency. Negative for dysuria, urgency and flank pain.  Musculoskeletal: Positive for back pain. Negative for myalgias and neck pain.       Right lower back pain with walking or weight bearing on and off, positional.   Skin: Negative for rash.  Neurological: Positive for tremors. Negative for dizziness, seizures, weakness and headaches.  Psychiatric/Behavioral: Positive for memory loss. The patient is nervous/anxious.     Past Medical History  Diagnosis Date  . HYPERLIPIDEMIA 02/21/2009  . HYPOKALEMIA 08/17/2007  . ANEMIA, B12 DEFICIENCY 03/13/2009    . HYPERTENSION 05/14/2007  . Blood in stool 01/25/2008  . OSTEOARTHRITIS 05/14/2007  . OSTEOPENIA 05/14/2007    2007 hx of fosamax use   . TREMOR 07/08/2007  . FREQUENCY, URINARY 08/19/2008  . LUMBAR STRAIN 09/21/2007  . Gastric polyp     panendo  5 2009  . History of ankle fracture   . Parkinson's disease   . Adenomatous colon polyp   . Lumbar compression fracture ([place])   . GERD (gastroesophageal reflux disease)   . Edema   . Iron deficiency anemia   . Anxiety   . Constipation   . Hemorrhoid   . Psoriasis   . Hearing loss   . Memory deficit 06/05/2015    Patient Active Problem List   Diagnosis Date Noted  . Hypokalemia 12/20/2015  . Candidiasis of skin 10/09/2015  . UTI (urinary tract infection) 08/10/2015  . Memory deficit 06/05/2015  . Generalized weakness 06/05/2015  . Anemia   . IDA (iron deficiency anemia) 03/26/2015  . Edema 12/22/2014  . Compression fracture of L3 lumbar vertebra ([place]) 12/14/2014  . GERD (gastroesophageal reflux disease) 12/14/2014  . Low back pain radiating to right leg 11/08/2014  . Complete heart block ([place]) 08/22/2014  . Syncope 07/19/2014  . Adjustment disorder with anxious mood 05/13/2014  . Left leg pain 11/01/2013  . Benign neoplasm of colon 07/19/2013  . Hemorrhage of rectum and anus 07/19/2013  . Chronic rhinosinusitis 02/03/2013  . Constipation 08/04/2012  . Parkinsonian tremor ([place]) 07/05/2012  . Decreased hearing 04/17/2011  . Family history of cancer 04/17/2011  . ANEMIA,  B12 DEFICIENCY 03/13/2009  . Dyslipidemia 02/21/2009  . FREQUENCY, URINARY 08/19/2008  . Essential hypertension 05/14/2007  . OSTEOARTHRITIS 05/14/2007  . OSTEOPENIA 05/14/2007    No Known Allergies  Medications: Patient's Medications  New Prescriptions   No medications on file  Previous Medications   ACETAMINOPHEN (TYLENOL) 325 MG TABLET    Take 650 mg by mouth every 6 (six) hours as needed for mild pain, moderate pain or fever.   CARBIDOPA-LEVODOPA  (SINEMET IR) 25-100 MG PER TABLET    Take 1 tablet by mouth 3 (three) times daily.   FAMOTIDINE (PEPCID) 20 MG TABLET    Take 20 mg by mouth 2 (two) times daily. 1 at bedtime to reduce stomach acid   HYDRALAZINE (APRESOLINE) 25 MG TABLET    One twice daily to control blood pressure   LORAZEPAM (ATIVAN) 0.5 MG TABLET    Take 0.5 mg by mouth daily as needed for anxiety.   ONDANSETRON (ZOFRAN) 4 MG TABLET    Take 4 mg by mouth every 8 (eight) hours as needed for nausea or vomiting.   POLYETHYL GLYCOL-PROPYL GLYCOL (SYSTANE) 0.4-0.3 % SOLN    Apply 1 drop to eye 2 (two) times daily.    POLYETHYLENE GLYCOL (MIRALAX / GLYCOLAX) PACKET    Take 17 g by mouth daily as needed for mild constipation.    POTASSIUM CHLORIDE SA (K-DUR,KLOR-CON) 20 MEQ TABLET    Take 20 mEq by mouth. Take one table daily for potassium   SERTRALINE (ZOLOFT) 50 MG TABLET    Take 50 mg by mouth daily. 1 daily for depression and anxiety   VITAMIN B-12 (CYANOCOBALAMIN) 1000 MCG TABLET    Take 1,000 mcg by mouth daily. One daily for B 12 supplement  Modified Medications   No medications on file  Discontinued Medications   ASPIRIN 81 MG TABLET    Take 81 mg by mouth daily with breakfast.    CHOLECALCIFEROL (VITAMIN D3) 1000 UNITS CAPS    Take 1,000 Units by mouth daily with breakfast.    FUROSEMIDE (LASIX) 20 MG TABLET    Take 10 mg by mouth daily.   LORAZEPAM (ATIVAN) 0.5 MG TABLET    Take 1 tablet (0.5 mg total) by mouth 2 (two) times daily.    Physical Exam: Filed Vitals:   01/17/16 1615  BP: 118/68  Pulse: 72  Temp: 97.7 F (36.5 C)  TempSrc: Oral  Resp: 24   There is no weight on file to calculate BMI.  Physical Exam  Constitutional: She is oriented to person, place, and time. She appears well-developed and well-nourished. No distress.  HENT:  Head: Normocephalic and atraumatic.  Right Ear: External ear normal.  Left Ear: External ear normal.  Nose: Nose normal.  Mouth/Throat: Oropharynx is clear and moist. No  oropharyngeal exudate.  Eyes: Conjunctivae and EOM are normal. Pupils are equal, round, and reactive to light. Right eye exhibits no discharge. Left eye exhibits no discharge. No scleral icterus.  Neck: Normal range of motion. Neck supple. No JVD present. No tracheal deviation present. No thyromegaly present.  Cardiovascular: Normal rate, regular rhythm, normal heart sounds and intact distal pulses.  Exam reveals no friction rub.   No murmur heard. Pulmonary/Chest: Effort normal and breath sounds normal. No stridor. No respiratory distress. She has no wheezes. She has no rales. She exhibits no tenderness.  Abdominal: Soft. Bowel sounds are normal. She exhibits no distension. There is no tenderness. There is no rebound and no guarding.  Musculoskeletal: Normal range  of motion. She exhibits edema and tenderness.  Lower back pain refers to the right thigh and knee--positional, Tylenol was requested Only trace edema seen in the left ankle.   Lymphadenopathy:    She has no cervical adenopathy.  Neurological: She is alert and oriented to person, place, and time. She has normal reflexes. No cranial nerve deficit. She exhibits normal muscle tone. Coordination abnormal.  Skin: Skin is warm and dry. No rash noted. She is not diaphoretic. No erythema. No pallor.  Beefy red areas in groins and intergluteal cleft  Psychiatric: She has a normal mood and affect. Her behavior is normal. Judgment and thought content normal.  Forgetful, stated she slept better    Labs reviewed: Basic Metabolic Panel:  Recent Labs  02/17/15 1414  11/16/15 12/07/15 12/19/15 12/26/15  NA 134*  < > 138 139 137  --   K 3.3*  < > 3.9 3.7 2.9* 3.5  CL 97  --   --   --   --   --   CO2 31  --   --   --   --   --   GLUCOSE 113*  --   --   --   --   --   BUN 23  < > 21 21 17   --   CREATININE 1.06  < > 0.7 0.6 0.7  --   CALCIUM 10.0  --   --   --   --   --   < > = values in this interval not displayed.  Liver Function  Tests:  Recent Labs  06/06/15 08/24/15 12/19/15  AST 9* 9* 8*  ALT 8 3* 3*  ALKPHOS 48 46 43    CBC:  Recent Labs  02/17/15 1414  08/24/15 10/19/15 12/19/15  WBC 8.6  < > 8.0 8.4 11.3  NEUTROABS 6.5  --   --   --   --   HGB 10.2*  < > 11.3* 11.0* 11.6*  HCT 31.3*  < > 33* 34* 35*  MCV 76.5*  --   --   --   --   PLT 400.0  < > 274 261 244  < > = values in this interval not displayed.  Lab Results  Component Value Date   TSH 1.46 04/25/2015   No results found for: HGBA1C Lab Results  Component Value Date   CHOL 199 04/24/2012   HDL 69.30 04/24/2012   LDLCALC 105* 04/24/2012   LDLDIRECT 101.1 04/16/2011   TRIG 122.0 04/24/2012   CHOLHDL 3 04/24/2012    Significant Diagnostic Results since last visit: none  Patient Care Team: Estill Dooms, MD as PCP - General (Internal Medicine) Lafayette Dragon, MD (Gastroenterology) Penni Bombard, MD (Neurology) Rolm Bookbinder, MD as Attending Physician (Dermatology) Clent Jacks, MD (Ophthalmology) Belva Crome, MD as Consulting Physician (Cardiology) Evans Lance, MD as Consulting Physician (Cardiology) Reyne Dumas, MD as Consulting Physician (Internal Medicine) Suella Broad, MD as Consulting Physician (Physical Medicine and Rehabilitation) Man Mast X, NP as Nurse Practitioner (Nurse Practitioner)  Assessment/Plan Problem List Items Addressed This Visit    Essential hypertension - Primary (Chronic)    Permissive blood pressure control, continue Hydralazine 25mg  bid since 10/05/15      Parkinsonian tremor (HCC) (Chronic)    Takes Sinemet 25/100 tid, flat affect,  no disabling tremor, SNF for care needs.      Adjustment disorder with anxious mood (Chronic)    Mood is stabilized, continue Sertraline 50mg   daily and prn Lorazepam      GERD (gastroesophageal reflux disease) (Chronic)    Improved, continue Famotidine,  Zofran 5mg  1hr ac meals.       Edema (Chronic)    Not apparent, off Furosemide      Memory  deficit (Chronic)    Mild, forgetful, continue SNF for care needs.       Constipation    Stable, continue Senokot S II po qhs, Colace bid,  and started 03/06/15 prn MiaLax and MOM      Compression fracture of L3 lumbar vertebra (HCC)    Chronic back pain, managed with Tylenol.       IDA (iron deficiency anemia)    12/19/15 Hgb 11.6        Hypokalemia    12/26/15 K 3.5, continue Kcl supplement.           Family/ staff Communication: observe the patient.   Labs/tests ordered: none  ManXie Mast NP [facility] [address] [place],  [address] On Call:  [phone] & follow prompts after 5pm & weekends Office Phone:  [phone] Office Fax:  [phone]

## 2016-01-17 NOTE — Assessment & Plan Note (Signed)
Mood is stabilized, continue Sertraline 50mg daily and prn Lorazepam 

## 2016-01-17 NOTE — Assessment & Plan Note (Signed)
Improved, continue Famotidine,  Zofran 5mg  1hr ac meals.

## 2016-01-17 NOTE — Assessment & Plan Note (Signed)
12/19/15 Hgb 11.6

## 2016-01-17 NOTE — Assessment & Plan Note (Signed)
Mild, forgetful, continue SNF for care needs.  

## 2016-01-17 NOTE — Assessment & Plan Note (Signed)
12/26/15 K 3.5, continue Kcl supplement.

## 2016-01-17 NOTE — Assessment & Plan Note (Signed)
Chronic back pain, managed with Tylenol.

## 2016-01-17 NOTE — Assessment & Plan Note (Signed)
Permissive blood pressure control, continue Hydralazine 25mg  bid since 10/05/15

## 2016-01-17 NOTE — Assessment & Plan Note (Signed)
Not apparent, off Furosemide.  

## 2016-01-17 NOTE — Assessment & Plan Note (Signed)
Takes Sinemet 25/100 tid, flat affect,  no disabling tremor, SNF for care needs.  

## 2016-01-22 ENCOUNTER — Encounter: Payer: Self-pay | Admitting: Internal Medicine

## 2016-01-22 ENCOUNTER — Non-Acute Institutional Stay (SKILLED_NURSING_FACILITY): Payer: Medicare Other | Admitting: Internal Medicine

## 2016-01-22 DIAGNOSIS — R21 Rash and other nonspecific skin eruption: Secondary | ICD-10-CM

## 2016-01-22 MED ORDER — HYDROCORTISONE 1 % EX CREA: TOPICAL_CREAM | CUTANEOUS | Status: DC

## 2016-01-22 NOTE — Progress Notes (Signed)
[place] Room Number: 1  Place of Service: SNF (31)     No Known Allergies  Chief Complaint  Patient presents with  . Acute Visit    HPI:  I was asked see patient acutely to assess a rash on the right cheek close to her mouth. She says it is burning. There is erythema. The area appears to be rubbed Or scratched. She has number of senile ecchymoses on the arms and this may simply represent a further area of irritation. There is no blistering.  Medications: Patient's Medications  New Prescriptions   No medications on file  Previous Medications   ACETAMINOPHEN (TYLENOL) 325 MG TABLET    Take 650 mg by mouth every 6 (six) hours as needed for mild pain, moderate pain or fever.   CARBIDOPA-LEVODOPA (SINEMET IR) 25-100 MG PER TABLET    Take 1 tablet by mouth 3 (three) times daily.   FAMOTIDINE (PEPCID) 20 MG TABLET    Take 20 mg by mouth 2 (two) times daily. 1 at bedtime to reduce stomach acid   HYDRALAZINE (APRESOLINE) 25 MG TABLET    One twice daily to control blood pressure   LORAZEPAM (ATIVAN) 0.5 MG TABLET    Take 0.5 mg by mouth daily as needed for anxiety.   ONDANSETRON (ZOFRAN) 4 MG TABLET    Take 4 mg by mouth every 8 (eight) hours as needed for nausea or vomiting.   POLYETHYL GLYCOL-PROPYL GLYCOL (SYSTANE) 0.4-0.3 % SOLN    Apply 1 drop to eye 2 (two) times daily.    POLYETHYLENE GLYCOL (MIRALAX / GLYCOLAX) PACKET    Take 17 g by mouth daily as needed for mild constipation.    POTASSIUM CHLORIDE SA (K-DUR,KLOR-CON) 20 MEQ TABLET    Take 20 mEq by mouth. Take one table daily for potassium   SERTRALINE (ZOLOFT) 50 MG TABLET    Take 50 mg by mouth daily. 1 daily for depression and anxiety   VITAMIN B-12 (CYANOCOBALAMIN) 1000 MCG TABLET    Take 1,000 mcg by mouth daily. One daily for B 12 supplement  Modified Medications   No medications on file  Discontinued Medications   No medications on file     Review of Systems  Constitutional:  Negative for fever, chills and diaphoresis.       Gradual weight loss  HENT: Positive for hearing loss. Negative for congestion, ear pain and nosebleeds.   Eyes: Negative for pain, discharge and redness.  Respiratory: Negative for cough, shortness of breath and wheezing.   Cardiovascular: Positive for leg swelling. Negative for chest pain and palpitations.       Much improved, on trace edema in her left ankle seen  Gastrointestinal: Negative for nausea, vomiting, diarrhea and constipation.  Genitourinary: Positive for frequency. Negative for dysuria, urgency and flank pain.  Musculoskeletal: Positive for back pain. Negative for myalgias and neck pain.       Right lower back pain with walking or weight bearing on and off, positional.   Skin: Positive for rash (New rash on the right cheek close mouth. Some burning.).  Neurological: Positive for tremors. Negative for dizziness, seizures, weakness and headaches.  Psychiatric/Behavioral: The patient is nervous/anxious.     Filed Vitals:   01/22/16 1658  BP: 118/74  Pulse: 70  Temp: 99.2 F (37.3 C)  TempSrc: Tympanic  Resp: 18   Wt Readings from Last 3 Encounters:  10/05/15 102 lb 3.2 oz (46.358 kg)  06/05/15 97  lb (43.999 kg)  06/01/15 97 lb (43.999 kg)    There is no weight on file to calculate BMI.  Physical Exam  Constitutional: She is oriented to person, place, and time. She appears well-developed and well-nourished. No distress.  HENT:  Head: Normocephalic and atraumatic.  Right Ear: External ear normal.  Left Ear: External ear normal.  Nose: Nose normal.  Mouth/Throat: Oropharynx is clear and moist. No oropharyngeal exudate.  Eyes: Conjunctivae and EOM are normal. Pupils are equal, round, and reactive to light. Right eye exhibits no discharge. Left eye exhibits no discharge. No scleral icterus.  Neck: Normal range of motion. Neck supple. No JVD present. No tracheal deviation present. No thyromegaly present.    Cardiovascular: Normal rate, regular rhythm, normal heart sounds and intact distal pulses.  Exam reveals no friction rub.   No murmur heard. Pulmonary/Chest: Effort normal and breath sounds normal. No stridor. No respiratory distress. She has no wheezes. She has no rales. She exhibits no tenderness.  Abdominal: Soft. Bowel sounds are normal. She exhibits no distension. There is no tenderness. There is no rebound and no guarding.  Musculoskeletal: Normal range of motion. She exhibits edema and tenderness.  Lower back pain refers to the right thigh and knee--denied upon today's visist Only trace edema seen in the left ankle.   Lymphadenopathy:    She has no cervical adenopathy.  Neurological: She is alert and oriented to person, place, and time. She has normal reflexes. No cranial nerve deficit. She exhibits normal muscle tone. Coordination abnormal.  Skin: Skin is warm and dry. Rash (Erythematous linear area to the right of the mouth on the right cheek. No blistering.) noted. She is not diaphoretic. No erythema. No pallor.  Psychiatric: She has a normal mood and affect. Her behavior is normal. Judgment and thought content normal.  Forgetful     Labs reviewed: Lab Summary Latest Ref Rng 12/26/2015 12/19/2015 12/07/2015 11/16/2015 10/19/2015 09/05/2015 08/24/2015  Hemoglobin 12.0 - 16.0 g/dL (None) 11.6(A) (None) (None) 11.0(A) (None) 11.3(A)  Hematocrit 36 - 46 % (None) 35(A) (None) (None) 34(A) (None) 33(A)  White count - (None) 11.3 (None) (None) 8.4 (None) 8.0  Platelet count 150 - 399 K/L (None) 244 (None) (None) 261 (None) 274  Sodium 137 - 147 mmol/L (None) 137 139 138 (None) 139 139  Potassium 3.4 - 5.3 mmol/L 3.5 2.9(A) 3.7 3.9 (None) 3.8 3.2(A)  Calcium - (None) (None) (None) (None) (None) (None) (None)  Phosphorus - (None) (None) (None) (None) (None) (None) (None)  Creatinine 0.5 - 1.1 mg/dL (None) 0.7 0.6 0.7 (None) 0.6 0.8  AST 13 - 35 U/L (None) 8(A) (None) (None) (None) (None) 9(A)   Alk Phos 25 - 125 U/L (None) 43 (None) (None) (None) (None) 46  Bilirubin - (None) (None) (None) (None) (None) (None) (None)  Glucose - (None) 80 83 87 (None) 67 71  Cholesterol - (None) (None) (None) (None) (None) (None) (None)  HDL cholesterol - (None) (None) (None) (None) (None) (None) (None)  Triglycerides - (None) (None) (None) (None) (None) (None) (None)  LDL Direct - (None) (None) (None) (None) (None) (None) (None)  LDL Calc - (None) (None) (None) (None) (None) (None) (None)  Total protein - (None) (None) (None) (None) (None) (None) (None)  Albumin - (None) (None) (None) (None) (None) (None) (None)   Lab Results  Component Value Date   TSH 1.46 04/25/2015   Lab Results  Component Value Date   BUN 17 12/19/2015   BUN 21 12/07/2015   BUN 21  11/16/2015   Lab Results  Component Value Date   CREATININE 0.7 12/19/2015   CREATININE 0.6 12/07/2015   CREATININE 0.7 11/16/2015   No results found for: HGBA1C     Assessment/Plan  1. Rash and nonspecific skin eruption Apply hydrocortisone 1% cream to the rash on the right cheek daily until resolved.

## 2016-01-31 ENCOUNTER — Non-Acute Institutional Stay (SKILLED_NURSING_FACILITY): Payer: Medicare Other | Admitting: Nurse Practitioner

## 2016-01-31 ENCOUNTER — Encounter: Payer: Self-pay | Admitting: Nurse Practitioner

## 2016-01-31 DIAGNOSIS — E876 Hypokalemia: Secondary | ICD-10-CM | POA: Diagnosis not present

## 2016-01-31 DIAGNOSIS — K219 Gastro-esophageal reflux disease without esophagitis: Secondary | ICD-10-CM | POA: Diagnosis not present

## 2016-01-31 DIAGNOSIS — F4322 Adjustment disorder with anxiety: Secondary | ICD-10-CM

## 2016-01-31 DIAGNOSIS — S32030S Wedge compression fracture of third lumbar vertebra, sequela: Secondary | ICD-10-CM

## 2016-01-31 DIAGNOSIS — G20C Parkinsonism, unspecified: Secondary | ICD-10-CM

## 2016-01-31 DIAGNOSIS — I10 Essential (primary) hypertension: Secondary | ICD-10-CM | POA: Diagnosis not present

## 2016-01-31 DIAGNOSIS — G20 Parkinson's disease: Secondary | ICD-10-CM

## 2016-01-31 DIAGNOSIS — K5900 Constipation, unspecified: Secondary | ICD-10-CM

## 2016-01-31 DIAGNOSIS — D509 Iron deficiency anemia, unspecified: Secondary | ICD-10-CM

## 2016-01-31 DIAGNOSIS — R609 Edema, unspecified: Secondary | ICD-10-CM | POA: Diagnosis not present

## 2016-01-31 DIAGNOSIS — R413 Other amnesia: Secondary | ICD-10-CM | POA: Diagnosis not present

## 2016-01-31 NOTE — Assessment & Plan Note (Signed)
Chronic back pain, managed with Tylenol.

## 2016-01-31 NOTE — Assessment & Plan Note (Signed)
12/19/15 Hgb 11.6, update CBC

## 2016-01-31 NOTE — Assessment & Plan Note (Signed)
Not apparent, off Furosemide.  

## 2016-01-31 NOTE — Assessment & Plan Note (Signed)
Mood is stabilized, continue Sertraline 50mg daily and prn Lorazepam 

## 2016-01-31 NOTE — Assessment & Plan Note (Signed)
Mild, forgetful, continue SNF for care needs.  

## 2016-01-31 NOTE — Progress Notes (Signed)
Patient ID: Kimberly Silva, female   DOB: 1925-12-19, 82 y.o.   MRN: [id number]  Location:  SNF [facility] Provider:  Marlana Latus NP  Code Status:  DNR Goals of care: Advanced Directive information Does patient have an advance directive?: Yes, Type of Advance Directive: [place];Out of facility DNR (pink MOST or yellow form)  Chief Complaint  Patient presents with  . Acute Visit    diarrhea     HPI: Patient is a 82 y.o. female seen in the SNF at [facility] today for evaluation of reported diarrhea, the patient stated it has been less of control, not diarrhea, , denied abd pain, she is afebrile. Hx of hypokalemia, serum 3.5 12/26/15, taking Kcl, Hx of depression, stable while on Sertraline 50mg , blood pressure is controlled on Hydrolazine 25mg  bid, Parkinson's, mild tremor in hands, not disabling while on Sinemet 25/100tid.    Review of Systems  Constitutional: Positive for malaise/fatigue. Negative for fever, chills and diaphoresis.       Gradual weight loss  HENT: Positive for hearing loss. Negative for congestion, ear pain and nosebleeds.   Eyes: Negative for pain, discharge and redness.  Respiratory: Negative for cough, shortness of breath and wheezing.   Cardiovascular: Positive for leg swelling. Negative for chest pain and palpitations.       Much improved, on trace edema in her left ankle seen  Gastrointestinal: Negative for nausea, vomiting, diarrhea and constipation.  Genitourinary: Positive for frequency. Negative for dysuria, urgency and flank pain.  Musculoskeletal: Positive for back pain. Negative for myalgias and neck pain.       Right lower back pain with walking or weight bearing on and off, positional.   Skin: Negative for rash.  Neurological: Positive for tremors. Negative for dizziness, seizures, weakness and headaches.  Psychiatric/Behavioral: Positive for memory loss. The patient is nervous/anxious.     Past Medical History  Diagnosis  Date  . HYPERLIPIDEMIA 02/21/2009  . HYPOKALEMIA 08/17/2007  . ANEMIA, B12 DEFICIENCY 03/13/2009  . HYPERTENSION 05/14/2007  . Blood in stool 01/25/2008  . OSTEOARTHRITIS 05/14/2007  . OSTEOPENIA 05/14/2007    2007 hx of fosamax use   . TREMOR 07/08/2007  . FREQUENCY, URINARY 08/19/2008  . LUMBAR STRAIN 09/21/2007  . Gastric polyp     panendo  5 2009  . History of ankle fracture   . Parkinson's disease   . Adenomatous colon polyp   . Lumbar compression fracture ([place])   . GERD (gastroesophageal reflux disease)   . Edema   . Iron deficiency anemia   . Anxiety   . Constipation   . Hemorrhoid   . Psoriasis   . Hearing loss   . Memory deficit 06/05/2015    Patient Active Problem List   Diagnosis Date Noted  . Rash and nonspecific skin eruption 01/22/2016  . Hypokalemia 12/20/2015  . Candidiasis of skin 10/09/2015  . UTI (urinary tract infection) 08/10/2015  . Memory deficit 06/05/2015  . Generalized weakness 06/05/2015  . Anemia   . IDA (iron deficiency anemia) 03/26/2015  . Edema 12/22/2014  . Compression fracture of L3 lumbar vertebra ([place]) 12/14/2014  . GERD (gastroesophageal reflux disease) 12/14/2014  . Low back pain radiating to right leg 11/08/2014  . Complete heart block ([place]) 08/22/2014  . Syncope 07/19/2014  . Adjustment disorder with anxious mood 05/13/2014  . Left leg pain 11/01/2013  . Benign neoplasm of colon 07/19/2013  . Hemorrhage of rectum and anus 07/19/2013  . Chronic rhinosinusitis 02/03/2013  .  Constipation 08/04/2012  . Parkinsonian tremor ([place]) 07/05/2012  . Decreased hearing 04/17/2011  . Family history of cancer 04/17/2011  . ANEMIA, B12 DEFICIENCY 03/13/2009  . Dyslipidemia 02/21/2009  . FREQUENCY, URINARY 08/19/2008  . Essential hypertension 05/14/2007  . OSTEOARTHRITIS 05/14/2007  . OSTEOPENIA 05/14/2007    No Known Allergies  Medications: Patient's Medications  New Prescriptions   No medications on file  Previous Medications    ACETAMINOPHEN (TYLENOL) 325 MG TABLET    Take 650 mg by mouth every 6 (six) hours as needed for mild pain, moderate pain or fever.   CARBIDOPA-LEVODOPA (SINEMET IR) 25-100 MG PER TABLET    Take 1 tablet by mouth 3 (three) times daily.   FAMOTIDINE (PEPCID) 20 MG TABLET    Take 20 mg by mouth 2 (two) times daily. 1 at bedtime to reduce stomach acid   HYDRALAZINE (APRESOLINE) 25 MG TABLET    One twice daily to control blood pressure   LORAZEPAM (ATIVAN) 0.5 MG TABLET    Take 0.5 mg by mouth daily as needed for anxiety.   ONDANSETRON (ZOFRAN) 4 MG TABLET    Take 4 mg by mouth every 8 (eight) hours as needed for nausea or vomiting. Take 1 tablet by mouth 1 hour before meals   POLYETHYL GLYCOL-PROPYL GLYCOL (SYSTANE) 0.4-0.3 % SOLN    Apply 1 drop to eye 2 (two) times daily.    POLYETHYLENE GLYCOL (MIRALAX / GLYCOLAX) PACKET    Take 17 g by mouth daily as needed for mild constipation.    POTASSIUM CHLORIDE SA (K-DUR,KLOR-CON) 20 MEQ TABLET    Take 20 mEq by mouth. Take one table daily for potassium   SERTRALINE (ZOLOFT) 50 MG TABLET    Take 50 mg by mouth daily. 1 daily for depression and anxiety   VITAMIN B-12 (CYANOCOBALAMIN) 1000 MCG TABLET    Take 1,000 mcg by mouth daily. One daily for B 12 supplement  Modified Medications   No medications on file  Discontinued Medications   HYDROCORTISONE CREAM 1 %    Apply daily to rash on right cheek    Physical Exam: Filed Vitals:   01/31/16 1005  BP: 150/62  Pulse: 96  Temp: 97.6 F (36.4 C)  TempSrc: Oral  Resp: 22  Height: 4\' 11"  (1.499 m)  Weight: 99 lb 4.8 oz (45.042 kg)   Body mass index is 20.05 kg/(m^2).  Physical Exam  Constitutional: She is oriented to person, place, and time. She appears well-developed and well-nourished. No distress.  HENT:  Head: Normocephalic and atraumatic.  Right Ear: External ear normal.  Left Ear: External ear normal.  Nose: Nose normal.  Mouth/Throat: Oropharynx is clear and moist. No oropharyngeal  exudate.  Eyes: Conjunctivae and EOM are normal. Pupils are equal, round, and reactive to light. Right eye exhibits no discharge. Left eye exhibits no discharge. No scleral icterus.  Neck: Normal range of motion. Neck supple. No JVD present. No tracheal deviation present. No thyromegaly present.  Cardiovascular: Normal rate, regular rhythm, normal heart sounds and intact distal pulses.  Exam reveals no friction rub.   No murmur heard. Pulmonary/Chest: Effort normal and breath sounds normal. No stridor. No respiratory distress. She has no wheezes. She has no rales. She exhibits no tenderness.  Abdominal: Soft. Bowel sounds are normal. She exhibits no distension. There is no tenderness. There is no rebound and no guarding.  Musculoskeletal: Normal range of motion. She exhibits edema and tenderness.  Lower back pain refers to the right thigh and  knee--positional, Tylenol was requested Only trace edema seen in the left ankle.   Lymphadenopathy:    She has no cervical adenopathy.  Neurological: She is alert and oriented to person, place, and time. She has normal reflexes. No cranial nerve deficit. She exhibits normal muscle tone. Coordination abnormal.  Skin: Skin is warm and dry. No rash noted. She is not diaphoretic. No erythema. No pallor.  Beefy red areas in groins and intergluteal cleft  Psychiatric: She has a normal mood and affect. Her behavior is normal. Judgment and thought content normal.  Forgetful, stated she slept better    Labs reviewed: Basic Metabolic Panel:  Recent Labs  02/17/15 1414  11/16/15 12/07/15 12/19/15 12/26/15  NA 134*  < > 138 139 137  --   K 3.3*  < > 3.9 3.7 2.9* 3.5  CL 97  --   --   --   --   --   CO2 31  --   --   --   --   --   GLUCOSE 113*  --   --   --   --   --   BUN 23  < > 21 21 17   --   CREATININE 1.06  < > 0.7 0.6 0.7  --   CALCIUM 10.0  --   --   --   --   --   < > = values in this interval not displayed.  Liver Function Tests:  Recent Labs   06/06/15 08/24/15 12/19/15  AST 9* 9* 8*  ALT 8 3* 3*  ALKPHOS 48 46 43    CBC:  Recent Labs  02/17/15 1414  08/24/15 10/19/15 12/19/15  WBC 8.6  < > 8.0 8.4 11.3  NEUTROABS 6.5  --   --   --   --   HGB 10.2*  < > 11.3* 11.0* 11.6*  HCT 31.3*  < > 33* 34* 35*  MCV 76.5*  --   --   --   --   PLT 400.0  < > 274 261 244  < > = values in this interval not displayed.  Lab Results  Component Value Date   TSH 1.46 04/25/2015   No results found for: HGBA1C Lab Results  Component Value Date   CHOL 199 04/24/2012   HDL 69.30 04/24/2012   LDLCALC 105* 04/24/2012   LDLDIRECT 101.1 04/16/2011   TRIG 122.0 04/24/2012   CHOLHDL 3 04/24/2012    Significant Diagnostic Results since last visit: none  Patient Care Team: Estill Dooms, MD as PCP - General (Internal Medicine) Lafayette Dragon, MD (Gastroenterology) Penni Bombard, MD (Neurology) Rolm Bookbinder, MD as Attending Physician (Dermatology) Clent Jacks, MD (Ophthalmology) Belva Crome, MD as Consulting Physician (Cardiology) Evans Lance, MD as Consulting Physician (Cardiology) Reyne Dumas, MD as Consulting Physician (Internal Medicine) Suella Broad, MD as Consulting Physician (Physical Medicine and Rehabilitation) Mande Auvil X, NP as Nurse Practitioner (Nurse Practitioner)  Assessment/Plan Problem List Items Addressed This Visit    Essential hypertension - Primary (Chronic)    Permissive blood pressure control, continue Hydralazine 25mg  bid since 10/05/15      Parkinsonian tremor (HCC) (Chronic)    Continue Sinemet 25/100 tid, flat affect,  no disabling tremor, SNF for care needs.      Adjustment disorder with anxious mood (Chronic)    Mood is stabilized, continue Sertraline 50mg  daily and prn Lorazepam      GERD (gastroesophageal reflux disease) (Chronic)  Improved, continue Famotidine,  Zofran 4mg  prn      Edema (Chronic)    Not apparent, off Furosemide      Memory deficit (Chronic)    Mild,  forgetful, continue SNF for care needs.       Constipation    Loose stools, change Zofran to prn, the patient stated its more of incontinent of bowel instead of diarrhea, prn Imodium available to her. Observe, may consider Questran if no better. CBC, CMP, C-diff toxin A/B x3.       Compression fracture of L3 lumbar vertebra (HCC)    Chronic back pain, managed with Tylenol.       IDA (iron deficiency anemia)    12/19/15 Hgb 11.6, update CBC      Hypokalemia    12/26/15 K 3.5, continue Kcl supplement. Update CMP          Family/ staff Communication: observe the patient.   Labs/tests ordered: CBC, CMP, C-diff Toxin A/B x3  ManXie Srinika Delone NP [facility] [address], [place] [address] On Call:  [phone] & follow prompts after 5pm & weekends Office Phone:  [phone] Office Fax:  [phone]

## 2016-01-31 NOTE — Assessment & Plan Note (Signed)
Improved, continue Famotidine,  Zofran 4mg  prn

## 2016-01-31 NOTE — Assessment & Plan Note (Signed)
12/26/15 K 3.5, continue Kcl supplement. Update CMP

## 2016-01-31 NOTE — Assessment & Plan Note (Signed)
Continue Sinemet 25/100 tid, flat affect,  no disabling tremor, SNF for care needs.  

## 2016-01-31 NOTE — Assessment & Plan Note (Signed)
Loose stools, change Zofran to prn, the patient stated its more of incontinent of bowel instead of diarrhea, prn Imodium available to her. Observe, may consider Questran if no better. CBC, CMP, C-diff toxin A/B x3.

## 2016-01-31 NOTE — Assessment & Plan Note (Signed)
Permissive blood pressure control, continue Hydralazine 25mg  bid since 10/05/15

## 2016-02-01 LAB — CBC AND DIFFERENTIAL
HCT: 33 % — AB (ref 36–46)
Hemoglobin: 11 g/dL — AB (ref 12.0–16.0)
Platelets: 240 10*3/uL (ref 150–399)
WBC: 7.3 10*3/mL

## 2016-02-01 LAB — BASIC METABOLIC PANEL
BUN: 2 mg/dL — AB (ref 4–21)
Creatinine: 0.7 mg/dL (ref 0.5–1.1)
GLUCOSE: 77 mg/dL
POTASSIUM: 3.6 mmol/L (ref 3.4–5.3)
SODIUM: 134 mmol/L — AB (ref 137–147)

## 2016-02-01 LAB — HEPATIC FUNCTION PANEL
ALT: 3 U/L — AB (ref 7–35)
AST: 8 U/L — AB (ref 13–35)
Alkaline Phosphatase: 47 U/L (ref 25–125)
Bilirubin, Total: 0.4 mg/dL

## 2016-02-05 ENCOUNTER — Encounter: Payer: Self-pay | Admitting: Nurse Practitioner

## 2016-02-05 ENCOUNTER — Non-Acute Institutional Stay (SKILLED_NURSING_FACILITY): Payer: Medicare Other | Admitting: Nurse Practitioner

## 2016-02-05 DIAGNOSIS — R413 Other amnesia: Secondary | ICD-10-CM | POA: Diagnosis not present

## 2016-02-05 DIAGNOSIS — A0472 Enterocolitis due to Clostridium difficile, not specified as recurrent: Secondary | ICD-10-CM | POA: Insufficient documentation

## 2016-02-05 DIAGNOSIS — I10 Essential (primary) hypertension: Secondary | ICD-10-CM

## 2016-02-05 DIAGNOSIS — G20 Parkinson's disease: Secondary | ICD-10-CM

## 2016-02-05 DIAGNOSIS — E876 Hypokalemia: Secondary | ICD-10-CM

## 2016-02-05 DIAGNOSIS — R627 Adult failure to thrive: Secondary | ICD-10-CM

## 2016-02-05 DIAGNOSIS — A047 Enterocolitis due to Clostridium difficile: Secondary | ICD-10-CM

## 2016-02-05 DIAGNOSIS — D649 Anemia, unspecified: Secondary | ICD-10-CM | POA: Diagnosis not present

## 2016-02-05 DIAGNOSIS — K219 Gastro-esophageal reflux disease without esophagitis: Secondary | ICD-10-CM | POA: Diagnosis not present

## 2016-02-05 DIAGNOSIS — D509 Iron deficiency anemia, unspecified: Secondary | ICD-10-CM

## 2016-02-05 DIAGNOSIS — F4322 Adjustment disorder with anxiety: Secondary | ICD-10-CM | POA: Diagnosis not present

## 2016-02-05 HISTORY — DX: Enterocolitis due to Clostridium difficile, not specified as recurrent: A04.72

## 2016-02-05 NOTE — Assessment & Plan Note (Signed)
Improved, continue Famotidine,  Zofran 4mg  prn

## 2016-02-05 NOTE — Assessment & Plan Note (Signed)
Permissive blood pressure control, continue Hydralazine 25mg  bid since 10/05/15

## 2016-02-05 NOTE — Assessment & Plan Note (Signed)
12/19/15 Hgb 11.6 02/01/16 Hgb 11.0

## 2016-02-05 NOTE — Assessment & Plan Note (Signed)
10/19/15 Hgb 11.0 02/01/16 Hgb11.0

## 2016-02-05 NOTE — Assessment & Plan Note (Signed)
Mild, forgetful, continue SNF for care needs.  

## 2016-02-05 NOTE — Assessment & Plan Note (Signed)
Mood is stabilized, continue Sertraline 50mg daily and prn Lorazepam 

## 2016-02-05 NOTE — Assessment & Plan Note (Signed)
02/01/16 TP 4.7, albumin 2.7, continue supportive care.

## 2016-02-05 NOTE — Assessment & Plan Note (Signed)
02/01/16 C-diff GDG detected, C-diff toxin A/B not detected x3, Flagyl 500mg  q8h x 10 days started since 02/02/16 in setting of diarrhea. Observe the patient.

## 2016-02-05 NOTE — Assessment & Plan Note (Signed)
Continue Sinemet 25/100 tid, flat affect,  no disabling tremor, SNF for care needs.  

## 2016-02-05 NOTE — Progress Notes (Signed)
Patient ID: Kimberly Silva, female   DOB: 10-19-1926, 82 y.o.   MRN: [id number]  Location:  [place] Room Number: 1 Place of Service:  SNF (31) Provider: Lennie Odor Magda Muise NP  GREEN, [name], MD  Patient Care Team: Estill Dooms, MD as PCP - General (Internal Medicine) Lafayette Dragon, MD (Gastroenterology) Penni Bombard, MD (Neurology) Rolm Bookbinder, MD as Attending Physician (Dermatology) Clent Jacks, MD (Ophthalmology) Belva Crome, MD as Consulting Physician (Cardiology) Evans Lance, MD as Consulting Physician (Cardiology) Reyne Dumas, MD as Consulting Physician (Internal Medicine) Suella Broad, MD as Consulting Physician (Physical Medicine and Rehabilitation) Xyler Terpening X, NP as Nurse Practitioner (Nurse Practitioner)  Extended Emergency Contact Information Primary Emergency Contact: [facility] Address: [place], [place] [address] Johnnette Litter of [place] Phone: [phone] Relation: Other Secondary Emergency Contact: [name] Address: [address]          [place], [place] [address] Johnnette Litter of [place] Phone: [phone] Mobile Phone: [phone] Relation: Daughter  Code Status:  DNR Goals of care: Advanced Directive information Advanced Directives 02/05/2016  Does patient have an advance directive? Yes  Type of Paramedic of [place];Out of facility DNR (pink MOST or yellow form)  Does patient want to make changes to advanced directive? No - Patient declined  Copy of advanced directive(s) in chart? Yes     Chief Complaint  Patient presents with  . Acute Visit    C-Diff    HPI:  Pt is a 82 y.o. female seen today for an acute visit for diarrhea, C-diff GDH detected x3, Toxoin A/Bx3 not detected, Flagyl started in setting of c/o diarrhea, so far tolerated. Hx of    Past Medical History  Diagnosis Date  . HYPERLIPIDEMIA 02/21/2009  . HYPOKALEMIA 08/17/2007  .  ANEMIA, B12 DEFICIENCY 03/13/2009  . HYPERTENSION 05/14/2007  . Blood in stool 01/25/2008  . OSTEOARTHRITIS 05/14/2007  . OSTEOPENIA 05/14/2007    2007 hx of fosamax use   . TREMOR 07/08/2007  . FREQUENCY, URINARY 08/19/2008  . LUMBAR STRAIN 09/21/2007  . Gastric polyp     panendo  5 2009  . History of ankle fracture   . Parkinson's disease   . Adenomatous colon polyp   . Lumbar compression fracture ([place])   . GERD (gastroesophageal reflux disease)   . Edema   . Iron deficiency anemia   . Anxiety   . Constipation   . Hemorrhoid   . Psoriasis   . Hearing loss   . Memory deficit 06/05/2015   Past Surgical History  Procedure Laterality Date  . Abdominal hysterectomy  1974  . Fracture  2007    left ankle- Hx of fosamax use  . Dilation and curettage of uterus    . Cataract extraction, bilateral  20000  . Colonoscopy N/A 07/19/2013    Procedure: COLONOSCOPY;  Surgeon: Lafayette Dragon, MD;  Location: [facility];  Service: Endoscopy;  Laterality: N/A;  . Breast cyst aspiration      Not on File    Medication List       This list is accurate as of: 02/05/16 11:59 PM.  Always use your most recent med list.               acetaminophen 325 MG tablet  Commonly known as:  TYLENOL  Take 650 mg by mouth every 6 (six) hours as needed for  mild pain, moderate pain or fever.     carbidopa-levodopa 25-100 MG tablet  Commonly known as:  SINEMET IR  Take 1 tablet by mouth 3 (three) times daily.     famotidine 20 MG tablet  Commonly known as:  PEPCID  Take 20 mg by mouth 2 (two) times daily. 1 at bedtime to reduce stomach acid     hydrALAZINE 25 MG tablet  Commonly known as:  APRESOLINE  One twice daily to control blood pressure     LORazepam 0.5 MG tablet  Commonly known as:  ATIVAN  Take 0.5 mg by mouth daily as needed for anxiety.     ondansetron 4 MG tablet  Commonly known as:  ZOFRAN  Take 4 mg by mouth every 8 (eight) hours as needed for nausea or vomiting. Take 1 tablet by mouth  1 hour before meals     polyethylene glycol packet  Commonly known as:  MIRALAX / GLYCOLAX  Take 17 g by mouth daily as needed for mild constipation.     potassium chloride SA 20 MEQ tablet  Commonly known as:  K-DUR,KLOR-CON  Take 20 mEq by mouth. Take one table daily for potassium     sertraline 50 MG tablet  Commonly known as:  ZOLOFT  Take 50 mg by mouth daily. 1 daily for depression and anxiety     SYSTANE 0.4-0.3 % Soln  Generic drug:  Polyethyl Glycol-Propyl Glycol  Apply 1 drop to eye 2 (two) times daily.     vitamin B-12 1000 MCG tablet  Commonly known as:  CYANOCOBALAMIN  Take 1,000 mcg by mouth daily. One daily for B 12 supplement        Review of Systems  Constitutional: Negative for fever, chills and diaphoresis.       Gradual weight loss  HENT: Positive for hearing loss. Negative for congestion, ear pain and nosebleeds.   Eyes: Negative for pain, discharge and redness.  Respiratory: Negative for cough, shortness of breath and wheezing.   Cardiovascular: Positive for leg swelling. Negative for chest pain and palpitations.       Much improved, on trace edema in her left ankle seen  Gastrointestinal: Negative for nausea, vomiting, diarrhea and constipation.  Genitourinary: Positive for frequency. Negative for dysuria, urgency and flank pain.  Musculoskeletal: Positive for back pain. Negative for myalgias and neck pain.       Right lower back pain with walking or weight bearing on and off, positional.   Skin: Negative for rash.  Neurological: Positive for tremors. Negative for dizziness, seizures, weakness and headaches.  Psychiatric/Behavioral: The patient is nervous/anxious.     Immunization History  Administered Date(s) Administered  . Influenza Split 09/26/2011, 09/29/2012  . Influenza Whole 09/21/2007, 09/27/2008, 09/12/2009, 08/17/2010  . Influenza, High Dose Seasonal PF 09/22/2013  . Influenza,inj,Quad PF,36+ Mos 10/03/2014  . Influenza-Unspecified  09/06/2015  . PPD Test 06/01/2015  . Pneumococcal Conjugate-13 01/05/2014  . Pneumococcal Polysaccharide-23 12/17/1995  . Tdap 04/16/2011  . Zoster 08/27/2011   Pertinent  Health Maintenance Due  Topic Date Due  . INFLUENZA VACCINE  07/16/2016  . DEXA SCAN  Completed  . PNA vac Low Risk Adult  Completed   Fall Risk  10/05/2015 06/05/2015 03/23/2015 02/23/2015 01/05/2014  Falls in the past year? No No Yes Yes No  Number falls in past yr: - - 1 1 -  Injury with Fall? - - Yes No -  Risk for fall due to : - - Impaired balance/gait - -  Functional Status Survey:    There were no vitals filed for this visit. There is no weight on file to calculate BMI. Physical Exam  Constitutional: She is oriented to person, place, and time. She appears well-developed and well-nourished. No distress.  HENT:  Head: Normocephalic and atraumatic.  Right Ear: External ear normal.  Left Ear: External ear normal.  Nose: Nose normal.  Mouth/Throat: Oropharynx is clear and moist. No oropharyngeal exudate.  Eyes: Conjunctivae and EOM are normal. Pupils are equal, round, and reactive to light. Right eye exhibits no discharge. Left eye exhibits no discharge. No scleral icterus.  Neck: Normal range of motion. Neck supple. No JVD present. No tracheal deviation present. No thyromegaly present.  Cardiovascular: Normal rate, regular rhythm, normal heart sounds and intact distal pulses.  Exam reveals no friction rub.   No murmur heard. Pulmonary/Chest: Effort normal and breath sounds normal. No stridor. No respiratory distress. She has no wheezes. She has no rales. She exhibits no tenderness.  Abdominal: Soft. Bowel sounds are normal. She exhibits no distension. There is no tenderness. There is no rebound and no guarding.  Musculoskeletal: Normal range of motion. She exhibits edema and tenderness.  Lower back pain refers to the right thigh and knee--positional, Tylenol was requested Only trace edema seen in the left  ankle.   Lymphadenopathy:    She has no cervical adenopathy.  Neurological: She is alert and oriented to person, place, and time. She has normal reflexes. No cranial nerve deficit. She exhibits normal muscle tone. Coordination abnormal.  Skin: Skin is warm and dry. No rash noted. She is not diaphoretic. No erythema. No pallor.  Beefy red areas in groins and intergluteal cleft  Psychiatric: She has a normal mood and affect. Her behavior is normal. Judgment and thought content normal.  Forgetful, stated she slept better    Labs reviewed:  Recent Labs  02/17/15 1414  12/07/15 12/19/15 12/26/15 02/01/16  NA 134*  < > 139 137  --  134*  K 3.3*  < > 3.7 2.9* 3.5 3.6  CL 97  --   --   --   --   --   CO2 31  --   --   --   --   --   GLUCOSE 113*  --   --   --   --   --   BUN 23  < > 21 17  --  2*  CREATININE 1.06  < > 0.6 0.7  --  0.7  CALCIUM 10.0  --   --   --   --   --   < > = values in this interval not displayed.  Recent Labs  08/24/15 12/19/15 02/01/16  AST 9* 8* 8*  ALT 3* 3* 3*  ALKPHOS 46 43 47    Recent Labs  02/17/15 1414  10/19/15 12/19/15 02/01/16  WBC 8.6  < > 8.4 11.3 7.3  NEUTROABS 6.5  --   --   --   --   HGB 10.2*  < > 11.0* 11.6* 11.0*  HCT 31.3*  < > 34* 35* 33*  MCV 76.5*  --   --   --   --   PLT 400.0  < > 261 244 240  < > = values in this interval not displayed. Lab Results  Component Value Date   TSH 1.46 04/25/2015   No results found for: HGBA1C Lab Results  Component Value Date   CHOL 199 04/24/2012   HDL  69.30 04/24/2012   LDLCALC 105* 04/24/2012   LDLDIRECT 101.1 04/16/2011   TRIG 122.0 04/24/2012   CHOLHDL 3 04/24/2012    Significant Diagnostic Results in last 30 days:  No results found.  Assessment/Plan Essential hypertension Permissive blood pressure control, continue Hydralazine 25mg  bid since 10/05/15  Parkinsonian tremor (HCC) Continue Sinemet 25/100 tid, flat affect,  no disabling tremor, SNF for care needs.  Adjustment  disorder with anxious mood Mood is stabilized, continue Sertraline 50mg  daily and prn Lorazepam  GERD (gastroesophageal reflux disease) Improved, continue Famotidine,  Zofran 4mg  prn  Anemia 10/19/15 Hgb 11.0 02/01/16 Hgb11.0  Memory deficit Mild, forgetful, continue SNF for care needs.   IDA (iron deficiency anemia) 12/19/15 Hgb 11.6 02/01/16 Hgb 11.0  Adult failure to thrive 02/01/16 TP 4.7, albumin 2.7, continue supportive care.   Clostridium difficile colitis 02/01/16 C-diff GDG detected, C-diff toxin A/B not detected x3, Flagyl 500mg  q8h x 10 days started since 02/02/16 in setting of diarrhea. Observe the patient.   Hypokalemia 12/19/15 K 2.9, the patient refuses taking Kcl, may repeat serum K  12/26/15 K 3.5 02/01/16 Na 134, K 3.6 Continue Kcl supplement.       Family/ staff Communication: continue SNF for care needs.   Labs/tests ordered: C-diff, CBC, CMP done 02/02/16

## 2016-02-05 NOTE — Assessment & Plan Note (Signed)
12/19/15 K 2.9, the patient refuses taking Kcl, may repeat serum K  12/26/15 K 3.5 02/01/16 Na 134, K 3.6 Continue Kcl supplement.

## 2016-02-13 ENCOUNTER — Telehealth: Payer: Self-pay | Admitting: *Deleted

## 2016-02-13 NOTE — Telephone Encounter (Signed)
Kimberly Silva, daughter called and stated that patient needed some prior authorizations on her medications. Explained to daughter that patient is in Skilled at the facility and the prior authorizations would go through the facility. Explained the process and she agreed and will call the nurse there.

## 2016-02-27 LAB — BASIC METABOLIC PANEL
BUN: 18 mg/dL (ref 4–21)
Creatinine: 0.6 mg/dL (ref 0.5–1.1)
GLUCOSE: 69 mg/dL
POTASSIUM: 3.8 mmol/L (ref 3.4–5.3)
SODIUM: 134 mmol/L — AB (ref 137–147)

## 2016-02-27 LAB — HEPATIC FUNCTION PANEL
ALK PHOS: 48 U/L (ref 25–125)
ALT: 3 U/L — AB (ref 7–35)
AST: 8 U/L — AB (ref 13–35)
BILIRUBIN, TOTAL: 0.7 mg/dL

## 2016-02-27 LAB — CBC AND DIFFERENTIAL
HEMATOCRIT: 33 % — AB (ref 36–46)
HEMOGLOBIN: 10.7 g/dL — AB (ref 12.0–16.0)
Platelets: 296 10*3/uL (ref 150–399)
WBC: 12.6 10^3/mL

## 2016-03-04 ENCOUNTER — Encounter: Payer: Self-pay | Admitting: Nurse Practitioner

## 2016-03-04 ENCOUNTER — Telehealth: Payer: Self-pay

## 2016-03-04 ENCOUNTER — Non-Acute Institutional Stay (SKILLED_NURSING_FACILITY): Payer: Medicare Other | Admitting: Nurse Practitioner

## 2016-03-04 DIAGNOSIS — G20 Parkinson's disease: Secondary | ICD-10-CM

## 2016-03-04 DIAGNOSIS — R609 Edema, unspecified: Secondary | ICD-10-CM

## 2016-03-04 DIAGNOSIS — D509 Iron deficiency anemia, unspecified: Secondary | ICD-10-CM

## 2016-03-04 DIAGNOSIS — N189 Chronic kidney disease, unspecified: Secondary | ICD-10-CM

## 2016-03-04 DIAGNOSIS — D518 Other vitamin B12 deficiency anemias: Secondary | ICD-10-CM | POA: Diagnosis not present

## 2016-03-04 DIAGNOSIS — K219 Gastro-esophageal reflux disease without esophagitis: Secondary | ICD-10-CM | POA: Diagnosis not present

## 2016-03-04 DIAGNOSIS — F4322 Adjustment disorder with anxiety: Secondary | ICD-10-CM | POA: Diagnosis not present

## 2016-03-04 DIAGNOSIS — R627 Adult failure to thrive: Secondary | ICD-10-CM

## 2016-03-04 DIAGNOSIS — S32030S Wedge compression fracture of third lumbar vertebra, sequela: Secondary | ICD-10-CM

## 2016-03-04 DIAGNOSIS — E876 Hypokalemia: Secondary | ICD-10-CM

## 2016-03-04 DIAGNOSIS — I10 Essential (primary) hypertension: Secondary | ICD-10-CM | POA: Diagnosis not present

## 2016-03-04 DIAGNOSIS — K5900 Constipation, unspecified: Secondary | ICD-10-CM

## 2016-03-04 DIAGNOSIS — A047 Enterocolitis due to Clostridium difficile: Secondary | ICD-10-CM

## 2016-03-04 DIAGNOSIS — G20C Parkinsonism, unspecified: Secondary | ICD-10-CM

## 2016-03-04 DIAGNOSIS — D631 Anemia in chronic kidney disease: Secondary | ICD-10-CM

## 2016-03-04 DIAGNOSIS — A0472 Enterocolitis due to Clostridium difficile, not specified as recurrent: Secondary | ICD-10-CM

## 2016-03-04 DIAGNOSIS — R413 Other amnesia: Secondary | ICD-10-CM

## 2016-03-04 NOTE — Assessment & Plan Note (Signed)
Permissive blood pressure control, continue Hydralazine 25mg  bid since 10/05/15

## 2016-03-04 NOTE — Assessment & Plan Note (Signed)
Mild, forgetful, continue SNF for care needs.  

## 2016-03-04 NOTE — Assessment & Plan Note (Signed)
Baseline Hgb 10-11

## 2016-03-04 NOTE — Assessment & Plan Note (Addendum)
12/26/15 K 3.5, 02/27/16 Na 134, K 3.8, continue Kcl supplement.

## 2016-03-04 NOTE — Assessment & Plan Note (Signed)
Mood is stabilized, continue Sertraline 50mg daily and prn Lorazepam 

## 2016-03-04 NOTE — Assessment & Plan Note (Signed)
02/01/16 C-diff GDG detected, C-diff toxin A/B not detected x3, Flagyl 500mg  q8h x 10 days started since 02/02/16 in setting of diarrhea. Observe the patient.  03/04/16 fully treated, clinically resolved, mild elevated wbc 12s

## 2016-03-04 NOTE — Progress Notes (Signed)
Patient ID: Kimberly Silva, female   DOB: 1926/01/23, 82 y.o.   MRN: [id number]  Location:  [place] Room Number: 1 Place of Service:  SNF (31) Provider: Lennie Odor Egypt Marchiano NP  GREEN, [name], MD  Patient Care Team: Estill Dooms, MD as PCP - General (Internal Medicine) Lafayette Dragon, MD (Gastroenterology) Penni Bombard, MD (Neurology) Rolm Bookbinder, MD as Attending Physician (Dermatology) Clent Jacks, MD (Ophthalmology) Belva Crome, MD as Consulting Physician (Cardiology) Evans Lance, MD as Consulting Physician (Cardiology) Reyne Dumas, MD as Consulting Physician (Internal Medicine) Suella Broad, MD as Consulting Physician (Physical Medicine and Rehabilitation) Lamiah Marmol X, NP as Nurse Practitioner (Nurse Practitioner)  Extended Emergency Contact Information Primary Emergency Contact: [facility] Address: [place], [place] [address] Johnnette Litter of [place] Phone: [phone] Relation: Other Secondary Emergency Contact: [name] Address: [place] [address]          [place], [place] [address] Johnnette Litter of [place] Phone: [phone] Mobile Phone: [phone] Relation: Daughter  Code Status:  DNR Goals of care: Advanced Directive information Advanced Directives 03/04/2016  Does patient have an advance directive? Yes  Type of Paramedic of [place];Out of facility DNR (pink MOST or yellow form)  Does patient want to make changes to advanced directive? No - Patient declined  Copy of advanced directive(s) in chart? Yes     Chief Complaint  Patient presents with  . Medical Management of Chronic Issues    Routine Visit    HPI:  Pt is a 82 y.o. female seen today for managing chronic medical conditions. Resolved diarrhea after C-diff fully treated with Flagyl, white count remains mildly elevated in 12s. Hx of depression, stable while on Sertraline 50mg , blood pressure is  controlled on Hydrolazine 25mg  bid, Parkinson's, mild tremor in hands, not disabling while on Sinemet 25/100tid.     Past Medical History  Diagnosis Date  . HYPERLIPIDEMIA 02/21/2009  . HYPOKALEMIA 08/17/2007  . ANEMIA, B12 DEFICIENCY 03/13/2009  . HYPERTENSION 05/14/2007  . Blood in stool 01/25/2008  . OSTEOARTHRITIS 05/14/2007  . OSTEOPENIA 05/14/2007    2007 hx of fosamax use   . TREMOR 07/08/2007  . FREQUENCY, URINARY 08/19/2008  . LUMBAR STRAIN 09/21/2007  . Gastric polyp     panendo  5 2009  . History of ankle fracture   . Parkinson's disease   . Adenomatous colon polyp   . Lumbar compression fracture ([place])   . GERD (gastroesophageal reflux disease)   . Edema   . Iron deficiency anemia   . Anxiety   . Constipation   . Hemorrhoid   . Psoriasis   . Hearing loss   . Memory deficit 06/05/2015   Past Surgical History  Procedure Laterality Date  . Abdominal hysterectomy  1974  . Fracture  2007    left ankle- Hx of fosamax use  . Dilation and curettage of uterus    . Cataract extraction, bilateral  20000  . Colonoscopy N/A 07/19/2013    Procedure: COLONOSCOPY;  Surgeon: Lafayette Dragon, MD;  Location: [facility];  Service: Endoscopy;  Laterality: N/A;  . Breast cyst aspiration      No Known Allergies    Medication List       This list is accurate as of: 03/04/16  2:01 PM.  Always use your most recent med list.  acetaminophen 325 MG tablet  Commonly known as:  TYLENOL  Take 650 mg by mouth every 6 (six) hours as needed for mild pain, moderate pain or fever.     carbidopa-levodopa 25-100 MG tablet  Commonly known as:  SINEMET IR  Take 1 tablet by mouth 3 (three) times daily.     famotidine 20 MG tablet  Commonly known as:  PEPCID  Take 20 mg by mouth 2 (two) times daily. 1 at bedtime to reduce stomach acid     hydrALAZINE 25 MG tablet  Commonly known as:  APRESOLINE  One twice daily to control blood pressure     LORazepam 0.5 MG tablet  Commonly known  as:  ATIVAN  Take 0.5 mg by mouth daily as needed for anxiety.     ondansetron 4 MG tablet  Commonly known as:  ZOFRAN  Take 4 mg by mouth every 6 (six) hours as needed for nausea or vomiting. Take 1 tablet by mouth 1 hour before meals     polyethylene glycol packet  Commonly known as:  MIRALAX / GLYCOLAX  Take 17 g by mouth daily as needed for mild constipation.     potassium chloride SA 20 MEQ tablet  Commonly known as:  K-DUR,KLOR-CON  Take 20 mEq by mouth. Take one table daily for potassium     sertraline 50 MG tablet  Commonly known as:  ZOLOFT  Take 50 mg by mouth daily. 1 daily for depression and anxiety     SYSTANE 0.4-0.3 % Soln  Generic drug:  Polyethyl Glycol-Propyl Glycol  Apply 1 drop to eye 2 (two) times daily.     vitamin B-12 1000 MCG tablet  Commonly known as:  CYANOCOBALAMIN  Take 1,000 mcg by mouth daily. One daily for B 12 supplement        Review of Systems  Constitutional: Negative for fever, chills and diaphoresis.       Gradual weight loss  HENT: Positive for hearing loss. Negative for congestion, ear pain and nosebleeds.   Eyes: Negative for pain, discharge and redness.  Respiratory: Negative for cough, shortness of breath and wheezing.   Cardiovascular: Positive for leg swelling. Negative for chest pain and palpitations.       Much improved, on trace edema in her left ankle seen  Gastrointestinal: Negative for nausea, vomiting, diarrhea and constipation.  Genitourinary: Positive for frequency. Negative for dysuria, urgency and flank pain.  Musculoskeletal: Positive for back pain. Negative for myalgias and neck pain.       Right lower back pain with walking or weight bearing on and off, positional.   Skin: Negative for rash.  Neurological: Positive for tremors. Negative for dizziness, seizures, weakness and headaches.  Psychiatric/Behavioral: The patient is nervous/anxious.     Immunization History  Administered Date(s) Administered  .  Influenza Split 09/26/2011, 09/29/2012  . Influenza Whole 09/21/2007, 09/27/2008, 09/12/2009, 08/17/2010  . Influenza, High Dose Seasonal PF 09/22/2013  . Influenza,inj,Quad PF,36+ Mos 10/03/2014  . Influenza-Unspecified 09/06/2015  . PPD Test 06/01/2015  . Pneumococcal Conjugate-13 01/05/2014  . Pneumococcal Polysaccharide-23 12/17/1995  . Tdap 04/16/2011  . Zoster 08/27/2011   Pertinent  Health Maintenance Due  Topic Date Due  . INFLUENZA VACCINE  07/16/2016  . DEXA SCAN  Completed  . PNA vac Low Risk Adult  Completed   Fall Risk  10/05/2015 06/05/2015 03/23/2015 02/23/2015 01/05/2014  Falls in the past year? No No Yes Yes No  Number falls in past yr: - - 1 1 -  Injury with Fall? - - Yes No -  Risk for fall due to : - - Impaired balance/gait - -   Functional Status Survey:    Filed Vitals:   03/04/16 0854  BP: 142/70  Pulse: 74  Temp: 98.7 F (37.1 C)  TempSrc: Oral  Resp: 18  Height: 5\' 1"  (1.549 m)  Weight: 97 lb (43.999 kg)   Body mass index is 18.34 kg/(m^2). Physical Exam  Constitutional: She is oriented to person, place, and time. She appears well-developed and well-nourished. No distress.  HENT:  Head: Normocephalic and atraumatic.  Right Ear: External ear normal.  Left Ear: External ear normal.  Nose: Nose normal.  Mouth/Throat: Oropharynx is clear and moist. No oropharyngeal exudate.  Eyes: Conjunctivae and EOM are normal. Pupils are equal, round, and reactive to light. Right eye exhibits no discharge. Left eye exhibits no discharge. No scleral icterus.  Neck: Normal range of motion. Neck supple. No JVD present. No tracheal deviation present. No thyromegaly present.  Cardiovascular: Normal rate, regular rhythm, normal heart sounds and intact distal pulses.  Exam reveals no friction rub.   No murmur heard. Pulmonary/Chest: Effort normal and breath sounds normal. No stridor. No respiratory distress. She has no wheezes. She has no rales. She exhibits no  tenderness.  Abdominal: Soft. Bowel sounds are normal. She exhibits no distension. There is no tenderness. There is no rebound and no guarding.  Musculoskeletal: Normal range of motion. She exhibits edema and tenderness.  Lower back pain refers to the right thigh and knee--positional, Tylenol was requested Only trace edema seen in the left ankle.   Lymphadenopathy:    She has no cervical adenopathy.  Neurological: She is alert and oriented to person, place, and time. She has normal reflexes. No cranial nerve deficit. She exhibits normal muscle tone. Coordination abnormal.  Skin: Skin is warm and dry. No rash noted. She is not diaphoretic. No erythema. No pallor.  Beefy red areas in groins and intergluteal cleft  Psychiatric: She has a normal mood and affect. Her behavior is normal. Judgment and thought content normal.  Forgetful, stated she slept better    Labs reviewed:  Recent Labs  12/19/15 12/26/15 02/01/16 02/27/16  NA 137  --  134* 134*  K 2.9* 3.5 3.6 3.8  BUN 17  --  2* 18  CREATININE 0.7  --  0.7 0.6    Recent Labs  12/19/15 02/01/16 02/27/16  AST 8* 8* 8*  ALT 3* 3* 3*  ALKPHOS 43 47 48    Recent Labs  12/19/15 02/01/16 02/27/16  WBC 11.3 7.3 12.6  HGB 11.6* 11.0* 10.7*  HCT 35* 33* 33*  PLT 244 240 296   Lab Results  Component Value Date   TSH 1.46 04/25/2015   No results found for: HGBA1C Lab Results  Component Value Date   CHOL 199 04/24/2012   HDL 69.30 04/24/2012   LDLCALC 105* 04/24/2012   LDLDIRECT 101.1 04/16/2011   TRIG 122.0 04/24/2012   CHOLHDL 3 04/24/2012    Significant Diagnostic Results in last 30 days:  No results found.  Assessment/Plan Adjustment disorder with anxious mood Mood is stabilized, continue Sertraline 50mg  daily and prn Lorazepam  Adult failure to thrive 02/01/16 TP 4.7, albumin 2.7, continue supportive care.   ANEMIA, B12 DEFICIENCY Stable, Hgb 10-11  Anemia Baseline Hgb 10-11  Clostridium difficile  colitis 02/01/16 C-diff GDG detected, C-diff toxin A/B not detected x3, Flagyl 500mg  q8h x 10 days started since 02/02/16 in setting of  diarrhea. Observe the patient.  03/04/16 fully treated, clinically resolved, mild elevated wbc 12s  Compression fracture of L3 lumbar vertebra Chronic back pain, managed with Tylenol.   Constipation Stable, continue Senokot S II po qhs, Colace bid,  and started 03/06/15 prn MiaLax and MOM  Edema Not apparent, off Furosemide, 02/27/16 Na 134, K 3.8, Bun 18, creat 0.59  Essential hypertension Permissive blood pressure control, continue Hydralazine 25mg  bid since 10/05/15  GERD (gastroesophageal reflux disease) Improved, continue Famotidine,  Zofran 4mg  prn  Hypokalemia 12/26/15 K 3.5, 02/27/16 Na 134, K 3.8, continue Kcl supplement.   IDA (iron deficiency anemia) Baseline Hgb 10-11  Memory deficit Mild, forgetful, continue SNF for care needs.   Parkinsonian tremor (HCC) Continue Sinemet 25/100 tid, flat affect,  no disabling tremor, SNF for care needs.      Family/ staff Communication: continue SNF for care needs.   Labs/tests ordered: C-diff, CBC, CMP done 02/02/16

## 2016-03-04 NOTE — Assessment & Plan Note (Signed)
Continue Sinemet 25/100 tid, flat affect,  no disabling tremor, SNF for care needs.  

## 2016-03-04 NOTE — Telephone Encounter (Signed)
-----   Message from Albertina Sen[place] May, [place] sent at 03/04/2016  3:11 PM EDT ----- Regarding: Patient daughter concern Contact: [phone] Patient daughter, [name] called wanting to speak with you concerning patient. She stated that she has been talking with you and wanted to get in touch with you. Wants you to call her at [phone]

## 2016-03-04 NOTE — Assessment & Plan Note (Signed)
Stable, continue Senokot S II po qhs, Colace bid,  and started 03/06/15 prn MiaLax and MOM 

## 2016-03-04 NOTE — Assessment & Plan Note (Signed)
02/01/16 TP 4.7, albumin 2.7, continue supportive care.

## 2016-03-04 NOTE — Assessment & Plan Note (Signed)
Chronic back pain, managed with Tylenol.

## 2016-03-04 NOTE — Assessment & Plan Note (Signed)
Stable, Hgb 10-11

## 2016-03-04 NOTE — Assessment & Plan Note (Signed)
Not apparent, off Furosemide, 02/27/16 Na 134, K 3.8, Bun 18, creat 0.59

## 2016-03-04 NOTE — Assessment & Plan Note (Signed)
Improved, continue Famotidine,  Zofran 4mg  prn

## 2016-03-07 ENCOUNTER — Telehealth: Payer: Self-pay | Admitting: *Deleted

## 2016-03-07 ENCOUNTER — Encounter: Payer: Self-pay | Admitting: Nurse Practitioner

## 2016-03-07 NOTE — Telephone Encounter (Signed)
Spoke with patient's daughter regarding medication prior authorization, she stated that she wanted to know if Kimberly Silva had sent the physician's appeal out yet and where was this process going at this time. I told her that I would talk to her tomorrow and have her call asap.

## 2016-04-03 IMAGING — CR DG ABDOMEN ACUTE W/ 1V CHEST
3 series · 3 of 3 positions shown · non-contrast
Comparison: 08/22/2014

CLINICAL DATA: Nausea and vomiting, decreased oral intake

EXAM:
ACUTE ABDOMEN SERIES (ABDOMEN 2 VIEW & CHEST 1 VIEW)

[w abdomen decub]
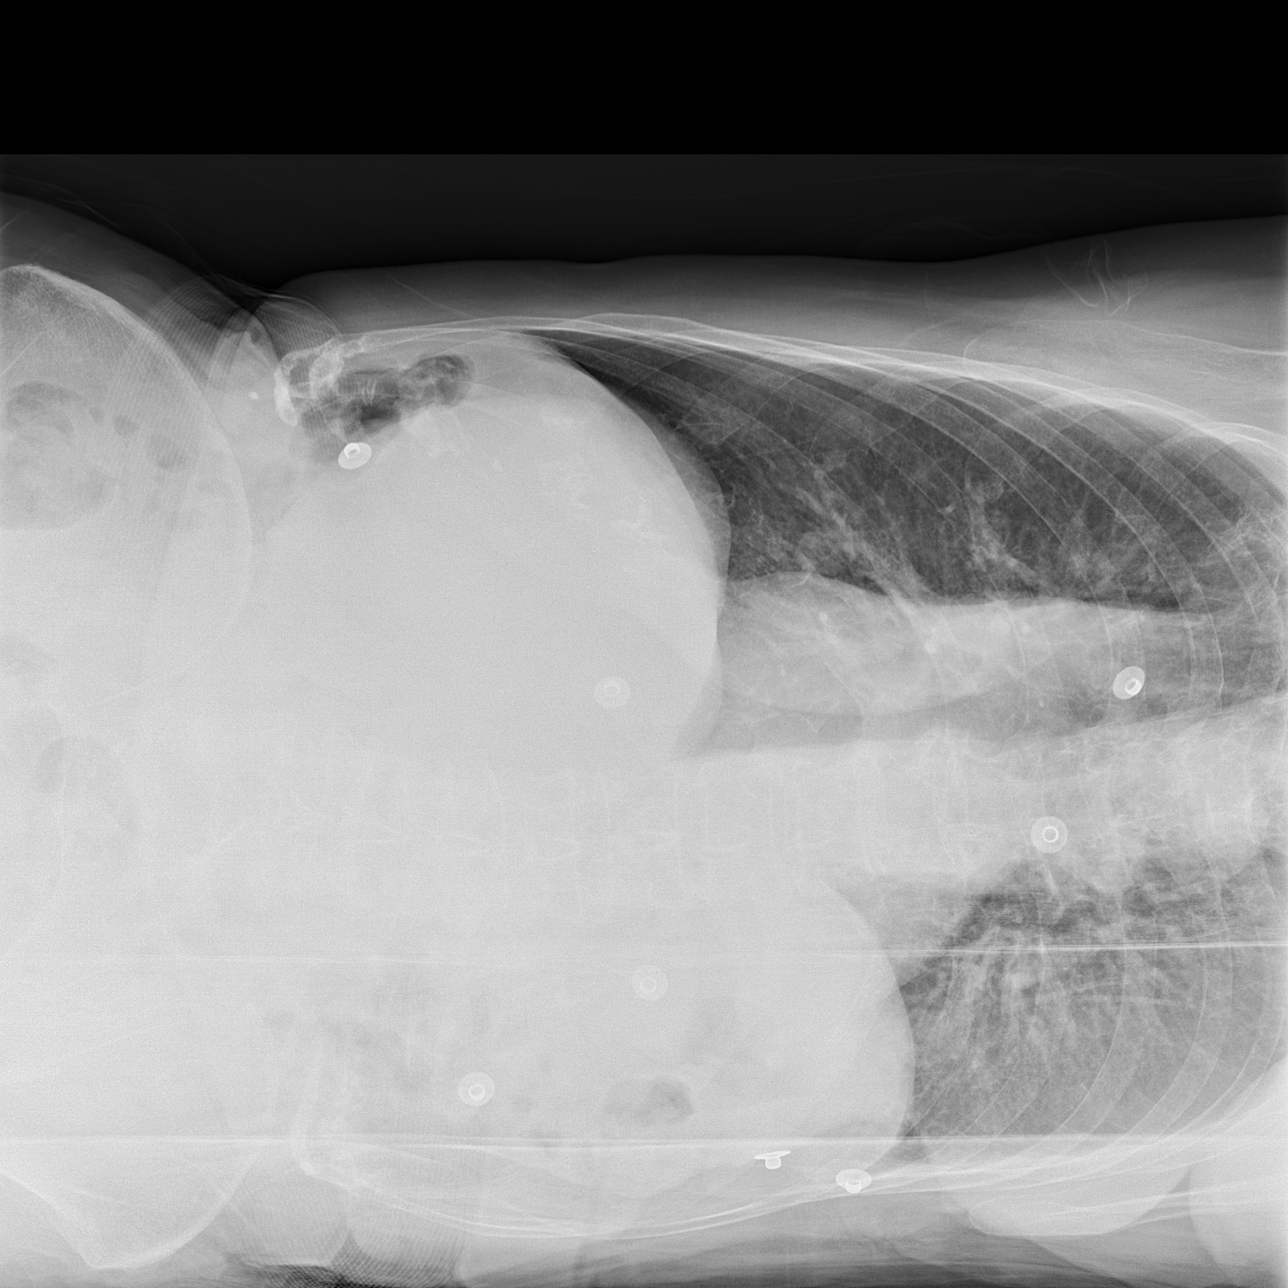

[x abdomen supine]
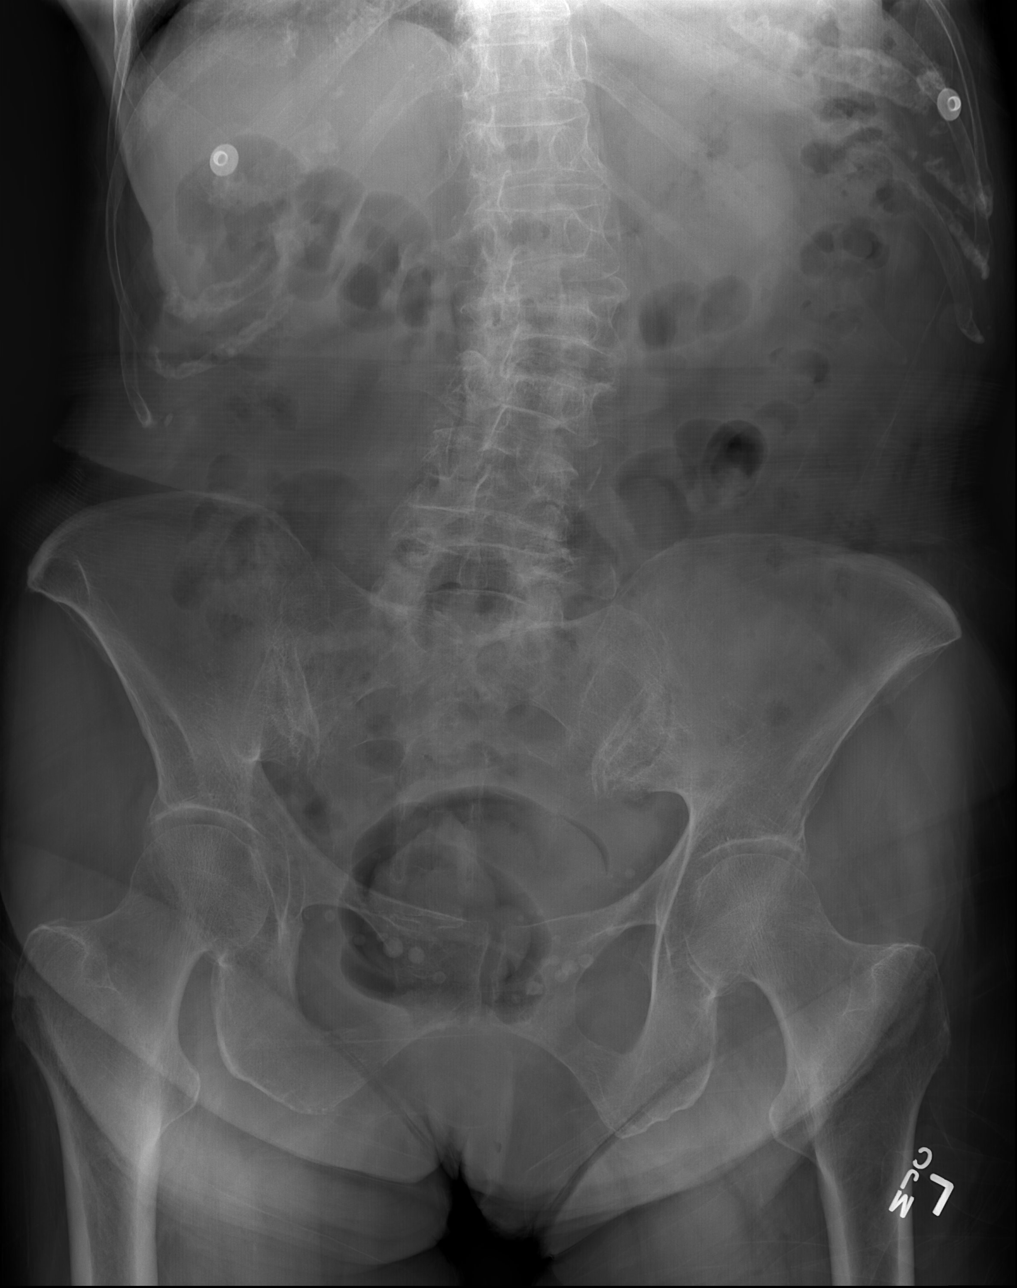

[x chest ap]
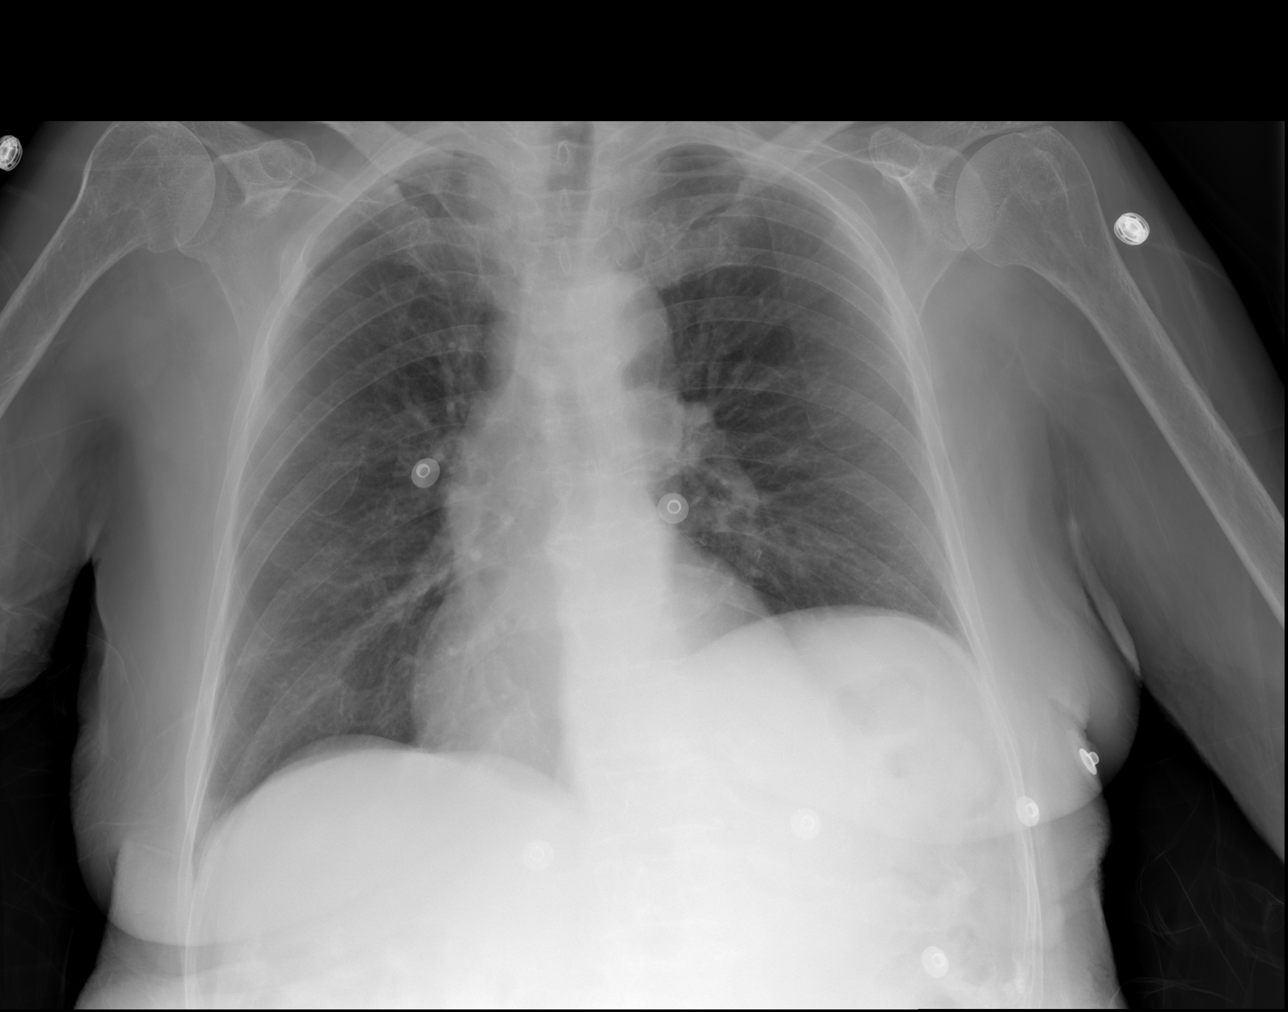

[3 of 3 positions shown; findings below may reference images not displayed]

FINDINGS: The heart and pulmonary vascularity are within normal limits.
Elevation of left hemidiaphragm is again seen. No acute bony
abnormality is noted.

And scattered large and small bowel gas is seen. No free air is
noted. No obstructive changes are seen. No abnormal mass or abnormal
calcifications are noted. Old compression deformities are noted
within the lumbar spine with evidence of a degenerative scoliosis
concave to the right in the lumbar spine.
IMPRESSION: L3 compression deformity consistent with the patient's given
clinical history. No acute abnormality is noted in the chest and
abdomen.

## 2016-04-04 IMAGING — CR DG CHEST 2V
2 series · 2 of 2 positions shown · non-contrast
Comparison: 12/08/2014

CLINICAL DATA: Cough and hypertension

EXAM:
CHEST  2 VIEW

[w chest pa]
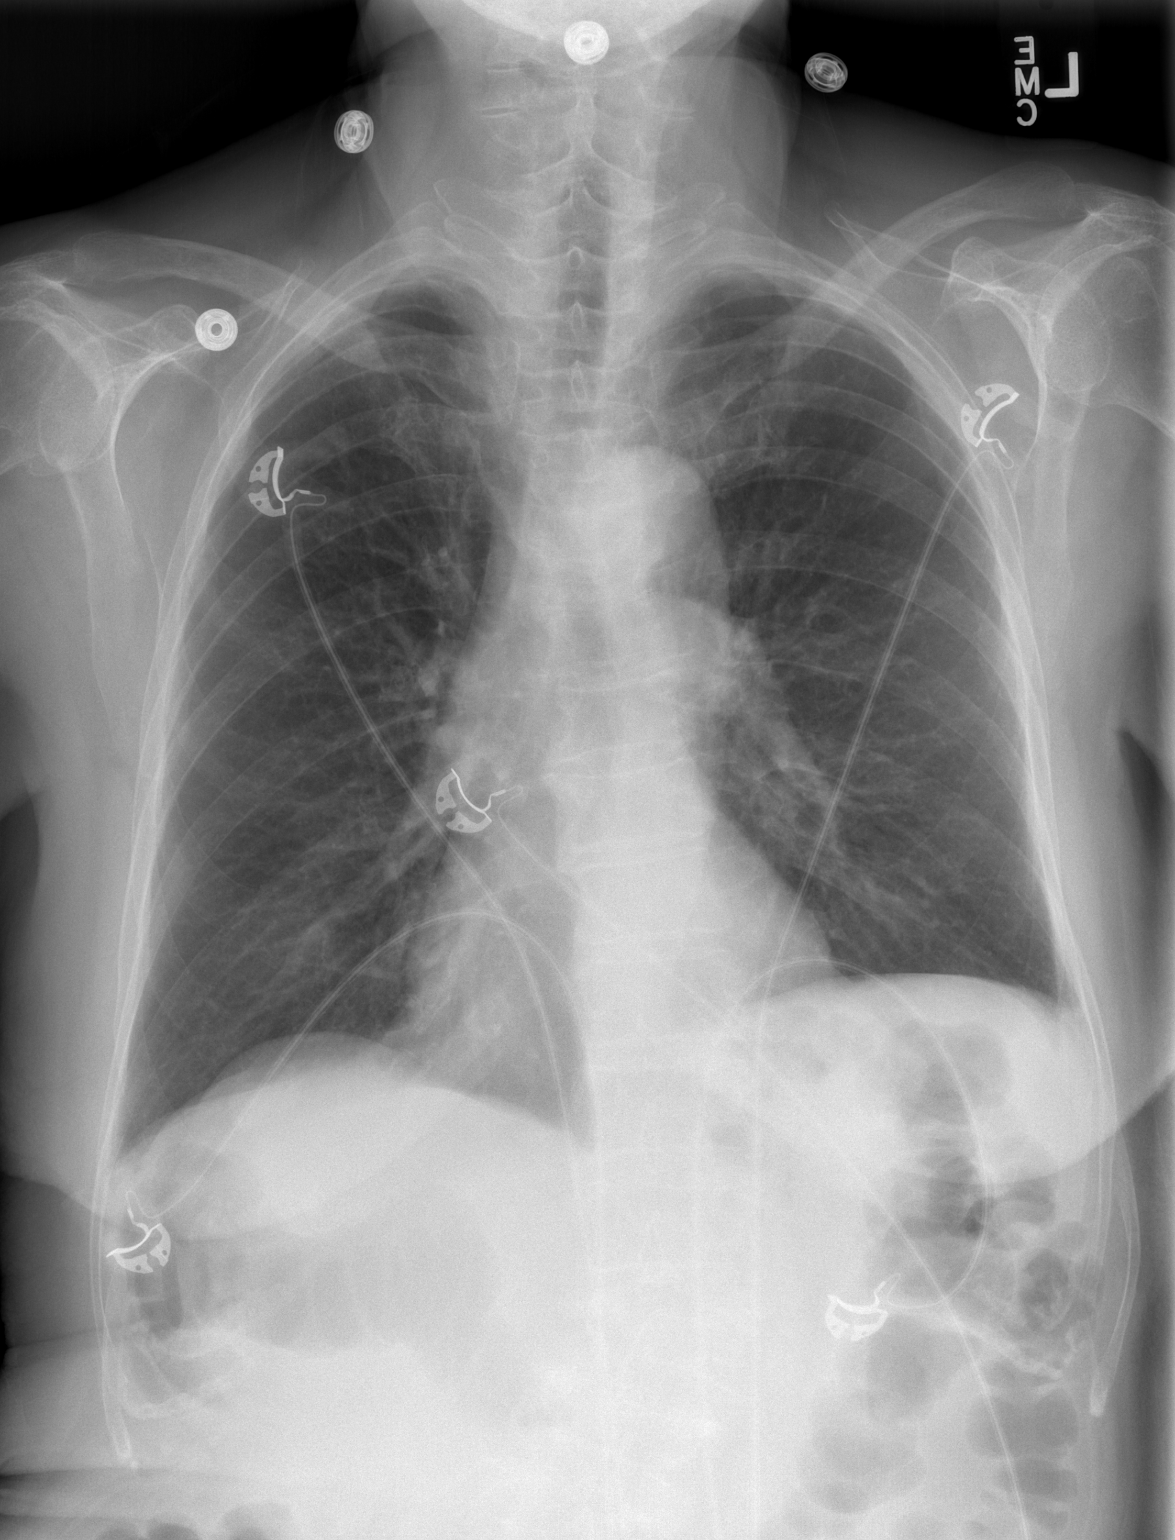

[w chest lat]
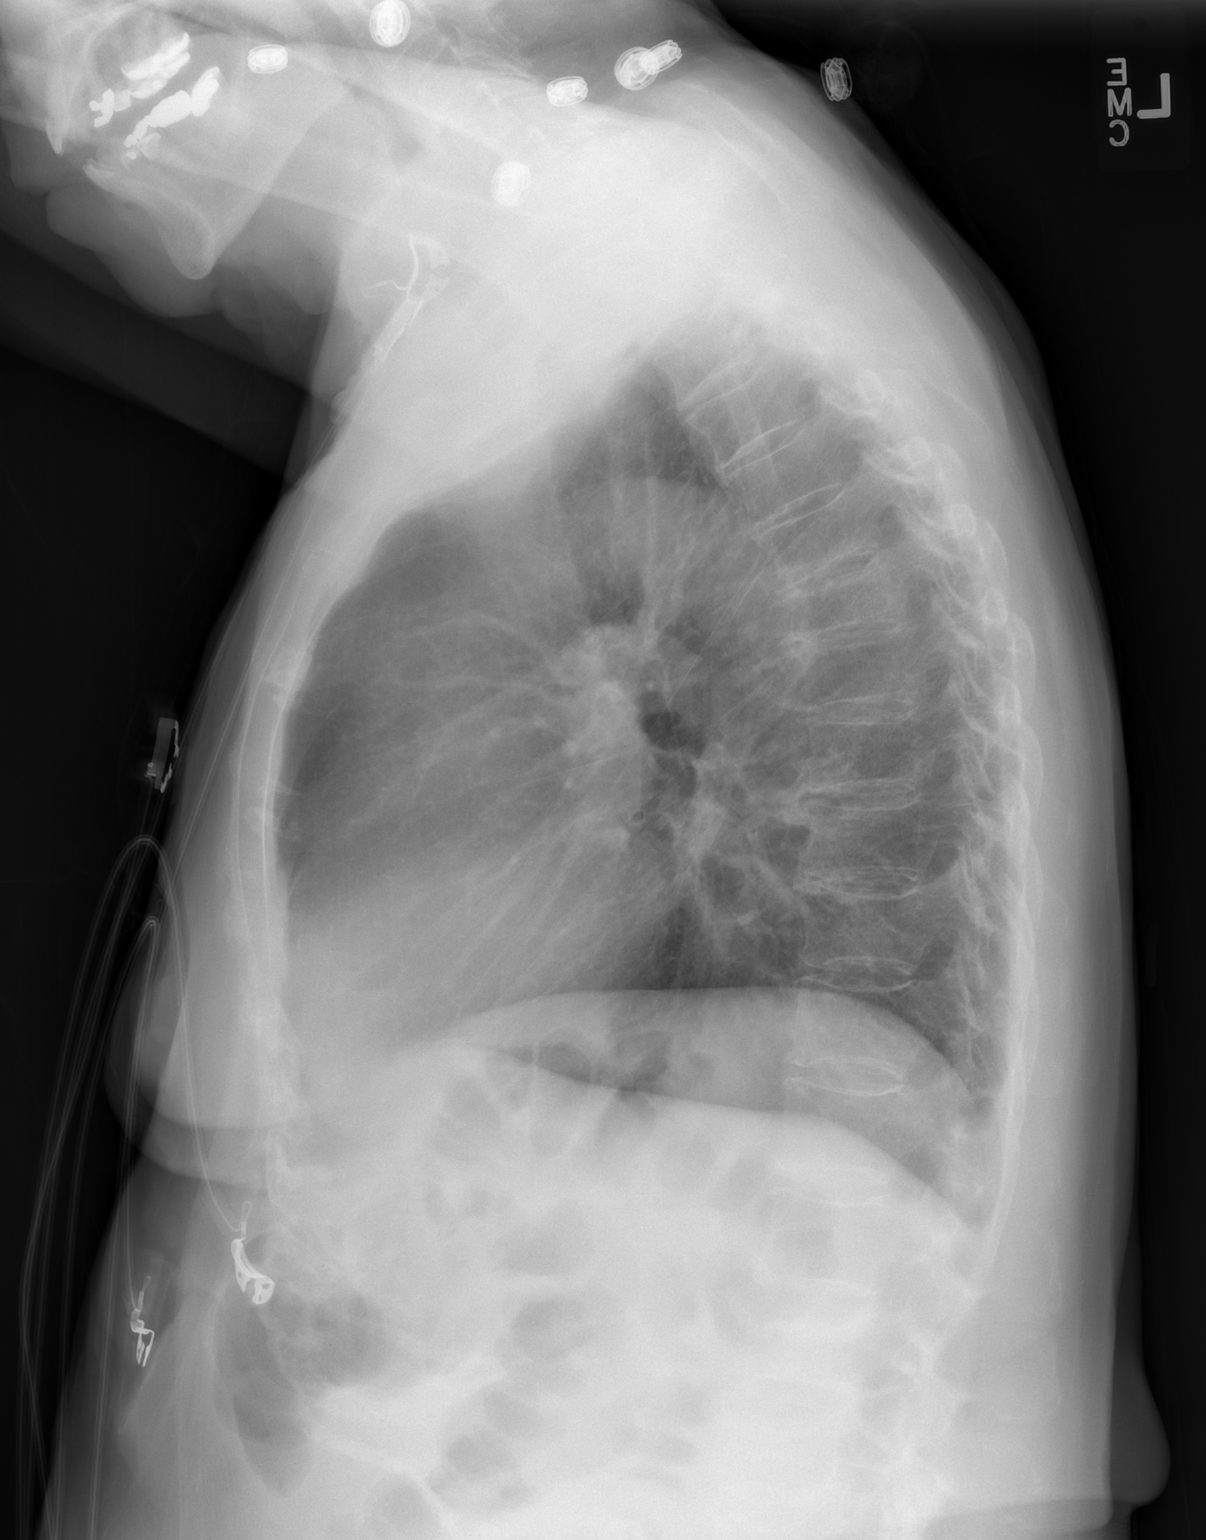

[2 of 2 positions shown; findings below may reference images not displayed]

FINDINGS: Cardiac shadow is within normal limits. Elevation of the left
hemidiaphragm is again seen. Lungs are well aerated bilaterally
without focal infiltrate or sizable effusion. Mild degenerative
changes of the thoracic spine are noted. A mid thoracic compression
deformity is noted better visualized on the current exam.
IMPRESSION: Thoracic compression deformity of uncertain age. No other focal
abnormality is seen.

## 2016-04-08 ENCOUNTER — Non-Acute Institutional Stay (SKILLED_NURSING_FACILITY): Payer: Medicare Other | Admitting: Nurse Practitioner

## 2016-04-08 DIAGNOSIS — R609 Edema, unspecified: Secondary | ICD-10-CM | POA: Diagnosis not present

## 2016-04-08 DIAGNOSIS — F4322 Adjustment disorder with anxiety: Secondary | ICD-10-CM | POA: Diagnosis not present

## 2016-04-08 DIAGNOSIS — K219 Gastro-esophageal reflux disease without esophagitis: Secondary | ICD-10-CM

## 2016-04-08 DIAGNOSIS — I10 Essential (primary) hypertension: Secondary | ICD-10-CM

## 2016-04-08 DIAGNOSIS — K5900 Constipation, unspecified: Secondary | ICD-10-CM

## 2016-04-08 DIAGNOSIS — R627 Adult failure to thrive: Secondary | ICD-10-CM

## 2016-04-08 DIAGNOSIS — G20 Parkinson's disease: Secondary | ICD-10-CM

## 2016-04-08 DIAGNOSIS — E876 Hypokalemia: Secondary | ICD-10-CM

## 2016-04-08 DIAGNOSIS — D509 Iron deficiency anemia, unspecified: Secondary | ICD-10-CM

## 2016-04-08 DIAGNOSIS — D518 Other vitamin B12 deficiency anemias: Secondary | ICD-10-CM

## 2016-04-08 DIAGNOSIS — A047 Enterocolitis due to Clostridium difficile: Secondary | ICD-10-CM | POA: Diagnosis not present

## 2016-04-08 DIAGNOSIS — D519 Vitamin B12 deficiency anemia, unspecified: Secondary | ICD-10-CM

## 2016-04-08 DIAGNOSIS — R413 Other amnesia: Secondary | ICD-10-CM

## 2016-04-08 DIAGNOSIS — S32030S Wedge compression fracture of third lumbar vertebra, sequela: Secondary | ICD-10-CM

## 2016-04-08 DIAGNOSIS — A0472 Enterocolitis due to Clostridium difficile, not specified as recurrent: Secondary | ICD-10-CM

## 2016-04-08 NOTE — Assessment & Plan Note (Signed)
Stable, Hgb 10-11, continue Vit B12 1055mcg po daily.

## 2016-04-08 NOTE — Assessment & Plan Note (Signed)
Improved, continue Famotidine,  Zofran 4mg  prn

## 2016-04-08 NOTE — Assessment & Plan Note (Signed)
Mild, forgetful, continue SNF for care needs.  

## 2016-04-08 NOTE — Assessment & Plan Note (Signed)
Mood is stabilized, continue Sertraline 50mg daily and prn Lorazepam 

## 2016-04-08 NOTE — Assessment & Plan Note (Signed)
Stable, continue Senokot S II po qhs, Colace bid,  and started 03/06/15 prn MiaLax and MOM 

## 2016-04-08 NOTE — Progress Notes (Signed)
Patient ID: Kimberly Silva, female   DOB: Jan 20, 1926, 82 y.o.   MRN: [id number]  Location:  [facility] Room Number: 1 Place of Service:  SNF (31) Provider: Lennie Odor Jailynn Lavalais NP  GREEN, [name], MD  Patient Care Team: Estill Dooms, MD as PCP - General (Internal Medicine) Lafayette Dragon, MD (Gastroenterology) Penni Bombard, MD (Neurology) Rolm Bookbinder, MD as Attending Physician (Dermatology) Clent Jacks, MD (Ophthalmology) Belva Crome, MD as Consulting Physician (Cardiology) Evans Lance, MD as Consulting Physician (Cardiology) Reyne Dumas, MD as Consulting Physician (Internal Medicine) Suella Broad, MD as Consulting Physician (Physical Medicine and Rehabilitation) Ronette Hank X, NP as Nurse Practitioner (Nurse Practitioner)  Extended Emergency Contact Information Primary Emergency Contact: [facility] Address: [place], [place] [address] Johnnette Litter of [place] Phone: [phone] Relation: Other Secondary Emergency Contact: [name] Address: [address]          [place], [place] [address] Johnnette Litter of [place] Phone: [phone] Mobile Phone: [phone] Relation: Daughter  Code Status:  DNR Goals of care: Advanced Directive information Advanced Directives 04/08/2016  Does patient have an advance directive? Yes  Type of Paramedic of [place];Out of facility DNR (pink MOST or yellow form)  Does patient want to make changes to advanced directive? No - Patient declined  Copy of advanced directive(s) in chart? Yes     Chief Complaint  Patient presents with  . Medical Management of Chronic Issues    Routine Visit    HPI:  Pt is a 82 y.o. female seen today for managing chronic medical conditions. Hx of depression, stable while on Sertraline 50mg , blood pressure is controlled on Hydrolazine 25mg  bid, Parkinson's, mild tremor in hands, not disabling while on Sinemet  25/100tid.     Past Medical History  Diagnosis Date  . HYPERLIPIDEMIA 02/21/2009  . HYPOKALEMIA 08/17/2007  . ANEMIA, B12 DEFICIENCY 03/13/2009  . HYPERTENSION 05/14/2007  . Blood in stool 01/25/2008  . OSTEOARTHRITIS 05/14/2007  . OSTEOPENIA 05/14/2007    2007 hx of fosamax use   . TREMOR 07/08/2007  . FREQUENCY, URINARY 08/19/2008  . LUMBAR STRAIN 09/21/2007  . Gastric polyp     panendo  5 2009  . History of ankle fracture   . Parkinson's disease   . Adenomatous colon polyp   . Lumbar compression fracture ([place])   . GERD (gastroesophageal reflux disease)   . Edema   . Iron deficiency anemia   . Anxiety   . Constipation   . Hemorrhoid   . Psoriasis   . Hearing loss   . Memory deficit 06/05/2015   Past Surgical History  Procedure Laterality Date  . Abdominal hysterectomy  1974  . Fracture  2007    left ankle- Hx of fosamax use  . Dilation and curettage of uterus    . Cataract extraction, bilateral  20000  . Colonoscopy N/A 07/19/2013    Procedure: COLONOSCOPY;  Surgeon: Lafayette Dragon, MD;  Location: [facility];  Service: Endoscopy;  Laterality: N/A;  . Breast cyst aspiration      No Known Allergies    Medication List       This list is accurate as of: 04/08/16 11:59 PM.  Always use your most recent med list.               acetaminophen 325 MG tablet  Commonly known as:  TYLENOL  Take  650 mg by mouth every 6 (six) hours as needed for mild pain, moderate pain or fever.     carbidopa-levodopa 25-100 MG tablet  Commonly known as:  SINEMET IR  Take 1 tablet by mouth 3 (three) times daily.     famotidine 20 MG tablet  Commonly known as:  PEPCID  Take 20 mg by mouth 2 (two) times daily. 1 at bedtime to reduce stomach acid     hydrALAZINE 25 MG tablet  Commonly known as:  APRESOLINE  One twice daily to control blood pressure     LORazepam 0.5 MG tablet  Commonly known as:  ATIVAN  Take 0.5 mg by mouth daily as needed for anxiety.     ondansetron 4 MG tablet    Commonly known as:  ZOFRAN  Take 4 mg by mouth every 6 (six) hours as needed for nausea or vomiting. Take 1 tablet by mouth 1 hour before meals     polyethylene glycol packet  Commonly known as:  MIRALAX / GLYCOLAX  Take 17 g by mouth daily as needed for mild constipation.     potassium chloride SA 20 MEQ tablet  Commonly known as:  K-DUR,KLOR-CON  Take 20 mEq by mouth. Take one table daily for potassium     sertraline 50 MG tablet  Commonly known as:  ZOLOFT  Take 50 mg by mouth daily. 1 daily for depression and anxiety     SYSTANE 0.4-0.3 % Soln  Generic drug:  Polyethyl Glycol-Propyl Glycol  Apply 1 drop to eye 2 (two) times daily.     vitamin B-12 1000 MCG tablet  Commonly known as:  CYANOCOBALAMIN  Take 1,000 mcg by mouth daily. One daily for B 12 supplement        Review of Systems  Constitutional: Negative for fever, chills and diaphoresis.       Gradual weight loss  HENT: Positive for hearing loss. Negative for congestion, ear pain and nosebleeds.   Eyes: Negative for pain, discharge and redness.  Respiratory: Negative for cough, shortness of breath and wheezing.   Cardiovascular: Positive for leg swelling. Negative for chest pain and palpitations.       Much improved, on trace edema in her left ankle seen  Gastrointestinal: Negative for nausea, vomiting, diarrhea and constipation.       Occasional nausea  Genitourinary: Positive for frequency. Negative for dysuria, urgency and flank pain.  Musculoskeletal: Positive for back pain. Negative for myalgias and neck pain.       Right lower back pain with walking or weight bearing on and off, positional.   Skin: Negative for rash.  Neurological: Positive for tremors. Negative for dizziness, seizures, weakness and headaches.  Psychiatric/Behavioral: The patient is nervous/anxious.     Immunization History  Administered Date(s) Administered  . Influenza Split 09/26/2011, 09/29/2012  . Influenza Whole 09/21/2007,  09/27/2008, 09/12/2009, 08/17/2010  . Influenza, High Dose Seasonal PF 09/22/2013  . Influenza,inj,Quad PF,36+ Mos 10/03/2014  . Influenza-Unspecified 09/06/2015  . PPD Test 06/01/2015  . Pneumococcal Conjugate-13 01/05/2014  . Pneumococcal Polysaccharide-23 12/17/1995  . Tdap 04/16/2011  . Zoster 08/27/2011   Pertinent  Health Maintenance Due  Topic Date Due  . INFLUENZA VACCINE  07/16/2016  . DEXA SCAN  Completed  . PNA vac Low Risk Adult  Completed   Fall Risk  10/05/2015 06/05/2015 03/23/2015 02/23/2015 01/05/2014  Falls in the past year? No No Yes Yes No  Number falls in past yr: - - 1 1 -  Injury  with Fall? - - Yes No -  Risk for fall due to : - - Impaired balance/gait - -   Functional Status Survey:    Filed Vitals:   04/08/16 1501  BP: 142/70  Pulse: 74  Temp: 98.7 F (37.1 C)  TempSrc: Oral  Resp: 18  Height: 5\' 1"  (1.549 m)  Weight: 96 lb (43.545 kg)   Body mass index is 18.15 kg/(m^2). Physical Exam  Constitutional: She is oriented to person, place, and time. She appears well-developed and well-nourished. No distress.  HENT:  Head: Normocephalic and atraumatic.  Right Ear: External ear normal.  Left Ear: External ear normal.  Nose: Nose normal.  Mouth/Throat: Oropharynx is clear and moist. No oropharyngeal exudate.  Eyes: Conjunctivae and EOM are normal. Pupils are equal, round, and reactive to light. Right eye exhibits no discharge. Left eye exhibits no discharge. No scleral icterus.  Neck: Normal range of motion. Neck supple. No JVD present. No tracheal deviation present. No thyromegaly present.  Cardiovascular: Normal rate, regular rhythm, normal heart sounds and intact distal pulses.  Exam reveals no friction rub.   No murmur heard. Pulmonary/Chest: Effort normal and breath sounds normal. No stridor. No respiratory distress. She has no wheezes. She has no rales. She exhibits no tenderness.  Abdominal: Soft. Bowel sounds are normal. She exhibits no  distension. There is no tenderness. There is no rebound and no guarding.  Musculoskeletal: Normal range of motion. She exhibits edema and tenderness.  Lower back pain refers to the right thigh and knee--positional, Tylenol was requested Only trace edema seen in the left ankle.   Lymphadenopathy:    She has no cervical adenopathy.  Neurological: She is alert and oriented to person, place, and time. She has normal reflexes. No cranial nerve deficit. She exhibits normal muscle tone. Coordination abnormal.  Skin: Skin is warm and dry. No rash noted. She is not diaphoretic. No erythema. No pallor.  Beefy red areas in groins and intergluteal cleft  Psychiatric: She has a normal mood and affect. Her behavior is normal. Judgment and thought content normal.  Forgetful, stated she slept better    Labs reviewed:  Recent Labs  12/19/15 12/26/15 02/01/16 02/27/16  NA 137  --  134* 134*  K 2.9* 3.5 3.6 3.8  BUN 17  --  2* 18  CREATININE 0.7  --  0.7 0.6    Recent Labs  12/19/15 02/01/16 02/27/16  AST 8* 8* 8*  ALT 3* 3* 3*  ALKPHOS 43 47 48    Recent Labs  12/19/15 02/01/16 02/27/16  WBC 11.3 7.3 12.6  HGB 11.6* 11.0* 10.7*  HCT 35* 33* 33*  PLT 244 240 296   Lab Results  Component Value Date   TSH 1.46 04/25/2015   No results found for: HGBA1C Lab Results  Component Value Date   CHOL 199 04/24/2012   HDL 69.30 04/24/2012   LDLCALC 105* 04/24/2012   LDLDIRECT 101.1 04/16/2011   TRIG 122.0 04/24/2012   CHOLHDL 3 04/24/2012    Significant Diagnostic Results in last 30 days:  No results found.  Assessment/Plan Adjustment disorder with anxious mood Mood is stabilized, continue Sertraline 50mg  daily and prn Lorazepam   Adult failure to thrive 02/01/16 TP 4.7, albumin 2.7, continue supportive care.    ANEMIA, B12 DEFICIENCY Stable, Hgb 10-11, continue Vit B12 1042mcg po daily.    Anemia Baseline Hgb 10-11 Continue Vit B 12  Clostridium difficile colitis Fully  treated in Feb 2/17, clinically resolved.  Compression fracture of L3 lumbar vertebra Chronic back pain, managed with Tylenol.   Constipation Stable, continue Senokot S II po qhs, Colace bid,  and started 03/06/15 prn MiaLax and MOM   Edema Not apparent, off Furosemide, 02/27/16 Na 134, K 3.8, Bun 18, creat 0.59   Essential hypertension Permissive blood pressure control, continue Hydralazine 25mg  bid since 10/05/15   GERD (gastroesophageal reflux disease) Improved, continue Famotidine,  Zofran 4mg  prn  Hypokalemia 12/26/15 K 3.5, 02/27/16 Na 134, K 3.8, continue Kcl supplement.    IDA (iron deficiency anemia) Baseline Hgb 10-11   Memory deficit Mild, forgetful, continue SNF for care needs.    Parkinsonian tremor (HCC) Continue Sinemet 25/100 tid, flat affect,  no disabling tremor, SNF for care needs.       Family/ staff Communication: continue SNF for care needs.   Labs/tests ordered: none

## 2016-04-08 NOTE — Assessment & Plan Note (Signed)
Fully treated in Feb 2/17, clinically resolved.

## 2016-04-08 NOTE — Assessment & Plan Note (Signed)
12/26/15 K 3.5, 02/27/16 Na 134, K 3.8, continue Kcl supplement.

## 2016-04-08 NOTE — Assessment & Plan Note (Signed)
Baseline Hgb 10-11

## 2016-04-08 NOTE — Assessment & Plan Note (Signed)
Chronic back pain, managed with Tylenol.

## 2016-04-08 NOTE — Assessment & Plan Note (Signed)
Baseline Hgb 10-11 Continue Vit B 12

## 2016-04-08 NOTE — Assessment & Plan Note (Signed)
Not apparent, off Furosemide, 02/27/16 Na 134, K 3.8, Bun 18, creat 0.59

## 2016-04-08 NOTE — Assessment & Plan Note (Signed)
02/01/16 TP 4.7, albumin 2.7, continue supportive care.

## 2016-04-08 NOTE — Assessment & Plan Note (Signed)
Permissive blood pressure control, continue Hydralazine 25mg  bid since 10/05/15

## 2016-04-08 NOTE — Assessment & Plan Note (Signed)
Continue Sinemet 25/100 tid, flat affect,  no disabling tremor, SNF for care needs.  

## 2016-04-11 ENCOUNTER — Encounter: Payer: Self-pay | Admitting: Nurse Practitioner

## 2016-04-17 ENCOUNTER — Telehealth: Payer: Self-pay

## 2016-04-17 NOTE — Telephone Encounter (Signed)
Received a letter stating that prior authorization for Ondansetron 4 mg tablets has been denied. The letter contained details about how to appeal the decision. Letter was placed in Dr. Rolly Salter folder for review.

## 2016-05-06 ENCOUNTER — Encounter: Payer: Self-pay | Admitting: Nurse Practitioner

## 2016-05-06 ENCOUNTER — Non-Acute Institutional Stay (SKILLED_NURSING_FACILITY): Payer: Medicare Other | Admitting: Nurse Practitioner

## 2016-05-06 DIAGNOSIS — E876 Hypokalemia: Secondary | ICD-10-CM | POA: Diagnosis not present

## 2016-05-06 DIAGNOSIS — B351 Tinea unguium: Secondary | ICD-10-CM | POA: Diagnosis not present

## 2016-05-06 DIAGNOSIS — R413 Other amnesia: Secondary | ICD-10-CM

## 2016-05-06 DIAGNOSIS — S32030S Wedge compression fracture of third lumbar vertebra, sequela: Secondary | ICD-10-CM

## 2016-05-06 DIAGNOSIS — I10 Essential (primary) hypertension: Secondary | ICD-10-CM | POA: Diagnosis not present

## 2016-05-06 DIAGNOSIS — G20 Parkinson's disease: Secondary | ICD-10-CM | POA: Diagnosis not present

## 2016-05-06 DIAGNOSIS — K5900 Constipation, unspecified: Secondary | ICD-10-CM

## 2016-05-06 DIAGNOSIS — D509 Iron deficiency anemia, unspecified: Secondary | ICD-10-CM

## 2016-05-06 DIAGNOSIS — K219 Gastro-esophageal reflux disease without esophagitis: Secondary | ICD-10-CM

## 2016-05-06 DIAGNOSIS — D518 Other vitamin B12 deficiency anemias: Secondary | ICD-10-CM

## 2016-05-06 DIAGNOSIS — F4322 Adjustment disorder with anxiety: Secondary | ICD-10-CM

## 2016-05-06 DIAGNOSIS — R609 Edema, unspecified: Secondary | ICD-10-CM

## 2016-05-06 NOTE — Assessment & Plan Note (Signed)
Mild, forgetful, continue SNF for care needs.

## 2016-05-06 NOTE — Assessment & Plan Note (Signed)
12/26/15 K 3.5, 02/27/16 Na 134, K 3.8, continue Kcl supplement.

## 2016-05-06 NOTE — Assessment & Plan Note (Signed)
Mood is stabilized, continue Sertraline 50mg  daily and prn Lorazepam

## 2016-05-06 NOTE — Assessment & Plan Note (Signed)
Not apparent, off Furosemide, 02/27/16 Na 134, K 3.8, Bun 18, creat 0.59

## 2016-05-06 NOTE — Assessment & Plan Note (Signed)
Continue Sinemet 25/100 tid, flat affect,  no disabling tremor, SNF for care needs.

## 2016-05-06 NOTE — Assessment & Plan Note (Signed)
Baseline Hgb 10-11, last Hgb 10.7 02/27/16

## 2016-05-06 NOTE — Assessment & Plan Note (Signed)
Stable, continue Senokot S II po qhs, Colace bid,  and started 03/06/15 prn MiaLax and MOM 

## 2016-05-06 NOTE — Assessment & Plan Note (Signed)
Stable, Hgb 10-11, continue Vit B12 1064mcg po daily. 02/27/16 Hgb 10.7

## 2016-05-06 NOTE — Assessment & Plan Note (Signed)
Chronic back pain, managed with Tylenol.

## 2016-05-06 NOTE — Progress Notes (Signed)
Patient ID: Kimberly Silva, female   DOB: May 25, 1926, 82 y.o.   MRN: [id number]  Location:  [facility] Room Number: 1 Place of Service:  SNF (31) Provider: Lennie Odor Addilyne Backs NP  Kimberly Silva, Kimberly Spare, Silva  Patient Care Team: Kimberly Dooms, Silva as PCP - Silva (Internal Medicine) Kimberly Silva (Gastroenterology) Kimberly Bombard, Silva (Neurology) Kimberly Bookbinder, Silva as Attending Physician (Dermatology) Kimberly Jacks, Silva (Ophthalmology) Kimberly Crome, Silva as Consulting Physician (Cardiology) Kimberly Lance, Silva as Consulting Physician (Cardiology) Kimberly Dumas, Silva as Consulting Physician (Internal Medicine) Kimberly Broad, Silva as Consulting Physician (Physical Medicine and Rehabilitation) Kimberly Kuntzman X, NP as Nurse Practitioner (Nurse Practitioner)  Extended Emergency Contact Information Primary Emergency Contact: Kimberly Silva: [place], [place] [address] Kimberly Silva Phone: [phone] Relation: Other Secondary Emergency Contact: Kimberly Silva Silva: [address]          [place],  [address] Kimberly Silva Phone: [phone] Mobile Phone: [phone] Relation: Daughter  Code Status:  DNR Goals of care: Advanced Directive information Advanced Directives 05/06/2016  Does patient have an advance directive? Yes  Type of Paramedic of [place];Out of facility DNR (pink MOST or yellow form)  Does patient want to make changes to advanced directive? No - Patient declined  Copy of advanced directive(s) in chart? Yes     Chief Complaint  Patient presents with  . Medical Management of Chronic Issues    HPI:  Pt is a 82 y.o. female seen today for managing chronic medical conditions. Hx of depression, stable while on Sertraline 50mg , blood pressure is controlled on Hydrolazine 25mg  bid, Parkinson's, mild tremor in hands, not disabling while on Sinemet 25/100tid.     Past  Medical History  Diagnosis Date  . HYPERLIPIDEMIA 02/21/2009  . HYPOKALEMIA 08/17/2007  . ANEMIA, B12 DEFICIENCY 03/13/2009  . HYPERTENSION 05/14/2007  . Blood in stool 01/25/2008  . OSTEOARTHRITIS 05/14/2007  . OSTEOPENIA 05/14/2007    2007 hx of fosamax use   . TREMOR 07/08/2007  . FREQUENCY, URINARY 08/19/2008  . LUMBAR STRAIN 09/21/2007  . Gastric polyp     panendo  5 2009  . History of ankle fracture   . Parkinson's disease   . Adenomatous colon polyp   . Lumbar compression fracture ([place])   . GERD (gastroesophageal reflux disease)   . Edema   . Iron deficiency anemia   . Anxiety   . Constipation   . Hemorrhoid   . Psoriasis   . Hearing loss   . Memory deficit 06/05/2015   Past Surgical History  Procedure Laterality Date  . Abdominal hysterectomy  1974  . Fracture  2007    left ankle- Hx of fosamax use  . Dilation and curettage of uterus    . Cataract extraction, bilateral  20000  . Colonoscopy N/A 07/19/2013    Procedure: COLONOSCOPY;  Surgeon: Kimberly Silva;  Location: [facility];  Service: Endoscopy;  Laterality: N/A;  . Breast cyst aspiration      No Known Allergies    Medication List       This list is accurate as of: 05/06/16 12:11 PM.  Always use your most recent med list.               acetaminophen 325 MG tablet  Commonly known as:  TYLENOL  Take 650 mg by mouth every  6 (six) hours as needed for mild pain, moderate pain or fever.     carbidopa-levodopa 25-100 MG tablet  Commonly known as:  SINEMET IR  Take 1 tablet by mouth 3 (three) times daily.     famotidine 20 MG tablet  Commonly known as:  PEPCID  Take 20 mg by mouth 2 (two) times daily. 1 at bedtime to reduce stomach acid     hydrALAZINE 25 MG tablet  Commonly known as:  APRESOLINE  One twice daily to control blood pressure     LORazepam 0.5 MG tablet  Commonly known as:  ATIVAN  Take 0.5 mg by mouth daily as needed for anxiety.     ondansetron 4 MG tablet  Commonly known as:  ZOFRAN    Take 4 mg by mouth every 6 (six) hours as needed for nausea or vomiting. Take 1 tablet by mouth 1 hour before meals     polyethylene glycol packet  Commonly known as:  MIRALAX / GLYCOLAX  Take 17 g by mouth daily as needed for mild constipation.     potassium chloride SA 20 MEQ tablet  Commonly known as:  K-DUR,KLOR-CON  Take 20 mEq by mouth. Take one table daily for potassium     sertraline 50 MG tablet  Commonly known as:  ZOLOFT  Take 50 mg by mouth daily. 1 daily for depression and anxiety     SYSTANE 0.4-0.3 % Soln  Generic drug:  Polyethyl Glycol-Propyl Glycol  Apply 1 drop to eye 2 (two) times daily.     vitamin B-12 1000 MCG tablet  Commonly known as:  CYANOCOBALAMIN  Take 1,000 mcg by mouth daily. One daily for B 12 supplement        Review of Systems  Constitutional: Negative for fever, chills and diaphoresis.       Gradual weight loss  HENT: Positive for hearing loss. Negative for congestion, ear pain and nosebleeds.   Eyes: Negative for pain, discharge and redness.  Respiratory: Negative for cough, shortness of breath and wheezing.   Cardiovascular: Positive for leg swelling. Negative for chest pain and palpitations.       Much improved, on trace edema in her left ankle seen  Gastrointestinal: Negative for nausea, vomiting, diarrhea and constipation.       Occasional nausea  Genitourinary: Positive for frequency. Negative for dysuria, urgency and flank pain.  Musculoskeletal: Positive for back pain. Negative for myalgias and neck pain.       Right lower back pain with walking or weight bearing on and off, positional.   Skin: Negative for rash.  Neurological: Positive for tremors. Negative for dizziness, seizures, weakness and headaches.  Psychiatric/Behavioral: The patient is nervous/anxious.     Immunization History  Administered Date(s) Administered  . Influenza Split 09/26/2011, 09/29/2012  . Influenza Whole 09/21/2007, 09/27/2008, 09/12/2009, 08/17/2010   . Influenza, High Dose Seasonal PF 09/22/2013  . Influenza,inj,Quad PF,36+ Mos 10/03/2014  . Influenza-Unspecified 09/06/2015  . PPD Test 06/01/2015  . Pneumococcal Conjugate-13 01/05/2014  . Pneumococcal Polysaccharide-23 12/17/1995  . Tdap 04/16/2011  . Zoster 08/27/2011   Pertinent  Health Maintenance Due  Topic Date Due  . INFLUENZA VACCINE  07/16/2016  . DEXA SCAN  Completed  . PNA vac Low Risk Adult  Completed   Fall Risk  10/05/2015 06/05/2015 03/23/2015 02/23/2015 01/05/2014  Falls in the past year? No No Yes Yes No  Number falls in past yr: - - 1 1 -  Injury with Fall? - - Yes  No -  Risk for fall due to : - - Impaired balance/gait - -   Functional Status Survey:    Filed Vitals:   05/06/16 0910  BP: 120/70  Pulse: 70  Temp: 98.3 F (36.8 C)  TempSrc: Oral  Resp: 20  Height: 5\' 1"  (1.549 m)  Weight: 94 lb 12.8 oz (43.001 kg)   Body mass index is 17.92 kg/(m^2). Physical Exam  Constitutional: She is oriented to person, place, and time. She appears well-developed and well-nourished. No distress.  HENT:  Head: Normocephalic and atraumatic.  Right Ear: External ear normal.  Left Ear: External ear normal.  Nose: Nose normal.  Mouth/Throat: Oropharynx is clear and moist. No oropharyngeal exudate.  Eyes: Conjunctivae and EOM are normal. Pupils are equal, round, and reactive to light. Right eye exhibits no discharge. Left eye exhibits no discharge. No scleral icterus.  Neck: Normal range of motion. Neck supple. No JVD present. No tracheal deviation present. No thyromegaly present.  Cardiovascular: Normal rate, regular rhythm, normal heart sounds and intact distal pulses.  Exam reveals no friction rub.   No murmur heard. Pulmonary/Chest: Effort normal and breath sounds normal. No stridor. No respiratory distress. She has no wheezes. She has no rales. She exhibits no tenderness.  Abdominal: Soft. Bowel sounds are normal. She exhibits no distension. There is no tenderness.  There is no rebound and no guarding.  Musculoskeletal: Normal range of motion. She exhibits edema and tenderness.  Lower back pain refers to the right thigh and knee--positional, Tylenol was requested Only trace edema seen in the left ankle.   Lymphadenopathy:    She has no cervical adenopathy.  Neurological: She is alert and oriented to person, place, and time. She has normal reflexes. No cranial nerve deficit. She exhibits normal muscle tone. Coordination abnormal.  Skin: Skin is warm and dry. No rash noted. She is not diaphoretic. No erythema. No pallor.  Discolored fingernails  Psychiatric: She has a normal mood and affect. Her behavior is normal. Judgment and thought content normal.  Forgetful, stated she slept better    Labs reviewed:  Recent Labs  12/19/15 12/26/15 02/01/16 02/27/16  NA 137  --  134* 134*  K 2.9* 3.5 3.6 3.8  BUN 17  --  2* 18  CREATININE 0.7  --  0.7 0.6    Recent Labs  12/19/15 02/01/16 02/27/16  AST 8* 8* 8*  ALT 3* 3* 3*  ALKPHOS 43 47 48    Recent Labs  12/19/15 02/01/16 02/27/16  WBC 11.3 7.3 12.6  HGB 11.6* 11.0* 10.7*  HCT 35* 33* 33*  PLT 244 240 296   Lab Results  Component Value Date   TSH 1.46 04/25/2015   No results found for: HGBA1C Lab Results  Component Value Date   CHOL 199 04/24/2012   HDL 69.30 04/24/2012   LDLCALC 105* 04/24/2012   LDLDIRECT 101.1 04/16/2011   TRIG 122.0 04/24/2012   CHOLHDL 3 04/24/2012    Significant Diagnostic Results in last 30 days:  No results found.  Assessment/Plan Parkinsonian tremor (HCC) Continue Sinemet 25/100 tid, flat affect,  no disabling tremor, SNF for care needs.    Memory deficit Mild, forgetful, continue SNF for care needs.     IDA (iron deficiency anemia) Baseline Hgb 10-11, last Hgb 10.7 02/27/16  Hypokalemia 12/26/15 K 3.5, 02/27/16 Na 134, K 3.8, continue Kcl supplement.     GERD (gastroesophageal reflux disease) stable, continue Famotidine,  Zofran 4mg   prn   Essential hypertension Permissive  blood pressure control, continue Hydralazine 25mg  bid since 10/05/15    Edema Not apparent, off Furosemide, 02/27/16 Na 134, K 3.8, Bun 18, creat 0.59    Constipation Stable, continue Senokot S II po qhs, Colace bid,  and started 03/06/15 prn MiaLax and MOM    Compression fracture of L3 lumbar vertebra Chronic back pain, managed with Tylenol.    Adjustment disorder with anxious mood Mood is stabilized, continue Sertraline 50mg  daily and prn Lorazepam   ANEMIA, B12 DEFICIENCY Stable, Hgb 10-11, continue Vit B12 1039mcg po daily. 02/27/16 Hgb 10.7       Family/ staff Communication: continue SNF for care needs.   Labs/tests ordered: none

## 2016-05-06 NOTE — Assessment & Plan Note (Signed)
stable, continue Famotidine,  Zofran 4mg  prn

## 2016-05-06 NOTE — Assessment & Plan Note (Signed)
Permissive blood pressure control, continue Hydralazine 25mg  bid since 10/05/15

## 2016-05-27 ENCOUNTER — Encounter: Payer: Self-pay | Admitting: Nurse Practitioner

## 2016-05-27 ENCOUNTER — Non-Acute Institutional Stay (SKILLED_NURSING_FACILITY): Payer: Medicare Other | Admitting: Nurse Practitioner

## 2016-05-27 DIAGNOSIS — E876 Hypokalemia: Secondary | ICD-10-CM

## 2016-05-27 DIAGNOSIS — R413 Other amnesia: Secondary | ICD-10-CM | POA: Diagnosis not present

## 2016-05-27 DIAGNOSIS — D500 Iron deficiency anemia secondary to blood loss (chronic): Secondary | ICD-10-CM | POA: Diagnosis not present

## 2016-05-27 DIAGNOSIS — G20 Parkinson's disease: Secondary | ICD-10-CM

## 2016-05-27 DIAGNOSIS — I10 Essential (primary) hypertension: Secondary | ICD-10-CM | POA: Diagnosis not present

## 2016-05-27 DIAGNOSIS — R627 Adult failure to thrive: Secondary | ICD-10-CM

## 2016-05-27 DIAGNOSIS — K5900 Constipation, unspecified: Secondary | ICD-10-CM | POA: Diagnosis not present

## 2016-05-27 DIAGNOSIS — G20C Parkinsonism, unspecified: Secondary | ICD-10-CM

## 2016-05-27 DIAGNOSIS — K219 Gastro-esophageal reflux disease without esophagitis: Secondary | ICD-10-CM

## 2016-05-27 DIAGNOSIS — W19XXXA Unspecified fall, initial encounter: Secondary | ICD-10-CM

## 2016-05-27 DIAGNOSIS — R609 Edema, unspecified: Secondary | ICD-10-CM | POA: Diagnosis not present

## 2016-05-27 DIAGNOSIS — F4322 Adjustment disorder with anxiety: Secondary | ICD-10-CM

## 2016-05-27 DIAGNOSIS — Y92129 Unspecified place in nursing home as the place of occurrence of the external cause: Principal | ICD-10-CM

## 2016-05-27 NOTE — Assessment & Plan Note (Signed)
Permissive blood pressure control, continue Hydralazine 25mg  bid since 10/05/15

## 2016-05-27 NOTE — Assessment & Plan Note (Addendum)
Mood is stabilized, continue Sertraline 50mg  daily and dc prn Lorazepam-not used

## 2016-05-27 NOTE — Assessment & Plan Note (Signed)
02/01/16 TP 4.7, albumin 2.7, continue supportive care.

## 2016-05-27 NOTE — Assessment & Plan Note (Signed)
Mild, forgetful, continue SNF for care needs.

## 2016-05-27 NOTE — Progress Notes (Signed)
Patient ID: Kimberly Silva, female   DOB: 01-10-1926, 81 y.o.   MRN: [id number]  Location:  [place] Room Number: 1 Place of Service:   SNF Provider: Lennie Odor Sharonna Vinje NP  GREEN, [name], MD  Patient Care Team: Estill Dooms, MD as PCP - General (Internal Medicine) Lafayette Dragon, MD (Gastroenterology) Penni Bombard, MD (Neurology) Rolm Bookbinder, MD as Attending Physician (Dermatology) Clent Jacks, MD (Ophthalmology) Belva Crome, MD as Consulting Physician (Cardiology) Evans Lance, MD as Consulting Physician (Cardiology) Reyne Dumas, MD as Consulting Physician (Internal Medicine) Suella Broad, MD as Consulting Physician (Physical Medicine and Rehabilitation) Mohamedamin Nifong Otho Darner, NP as Nurse Practitioner (Nurse Practitioner)  Extended Emergency Contact Information Primary Emergency Contact: [facility] Address: [place], [place] [address] Johnnette Litter of [place] Phone: [phone] Relation: Other Secondary Emergency Contact: [name] Address: [address]          [place], [place] [address] Johnnette Litter of [place] Phone: [phone] Mobile Phone: [phone] Relation: Daughter  Code Status:  DNR Goals of care: Advanced Directive information Advanced Directives 05/27/2016  Does patient have an advance directive? Yes  Type of Paramedic of [place];Out of facility DNR (pink MOST or yellow form)  Does patient want to make changes to advanced directive? No - Patient declined  Copy of advanced directive(s) in chart? Yes     Chief Complaint  Patient presents with  . Acute Visit    fell on bathroom floor(05/25/16)    HPI:  Pt is a 82 y.o. female seen today for managing chronic medical conditions. Hx of depression, stable while on Sertraline 50mg , blood pressure is controlled on Hydrolazine 25mg  bid, Parkinson's, mild tremor in hands, not disabling while on Sinemet 25/100tid.      Past Medical History  Diagnosis Date  . HYPERLIPIDEMIA 02/21/2009  . HYPOKALEMIA 08/17/2007  . ANEMIA, B12 DEFICIENCY 03/13/2009  . HYPERTENSION 05/14/2007  . Blood in stool 01/25/2008  . OSTEOARTHRITIS 05/14/2007  . OSTEOPENIA 05/14/2007    2007 hx of fosamax use   . TREMOR 07/08/2007  . FREQUENCY, URINARY 08/19/2008  . LUMBAR STRAIN 09/21/2007  . Gastric polyp     panendo  5 2009  . History of ankle fracture   . Parkinson's disease   . Adenomatous colon polyp   . Lumbar compression fracture ([place])   . GERD (gastroesophageal reflux disease)   . Edema   . Iron deficiency anemia   . Anxiety   . Constipation   . Hemorrhoid   . Psoriasis   . Hearing loss   . Memory deficit 06/05/2015   Past Surgical History  Procedure Laterality Date  . Abdominal hysterectomy  1974  . Fracture  2007    left ankle- Hx of fosamax use  . Dilation and curettage of uterus    . Cataract extraction, bilateral  20000  . Colonoscopy N/A 07/19/2013    Procedure: COLONOSCOPY;  Surgeon: Lafayette Dragon, MD;  Location: [facility];  Service: Endoscopy;  Laterality: N/A;  . Breast cyst aspiration      No Known Allergies    Medication List       This list is accurate as of: 05/27/16 11:59 PM.  Always use your most recent med list.               acetaminophen 325 MG tablet  Commonly known as:  TYLENOL  Take 650  mg by mouth every 6 (six) hours as needed for mild pain, moderate pain or fever.     carbidopa-levodopa 25-100 MG tablet  Commonly known as:  SINEMET IR  Take 1 tablet by mouth 3 (three) times daily.     famotidine 20 MG tablet  Commonly known as:  PEPCID  Take 20 mg by mouth 2 (two) times daily. 1 at bedtime to reduce stomach acid     hydrALAZINE 25 MG tablet  Commonly known as:  APRESOLINE  One twice daily to control blood pressure     LORazepam 0.5 MG tablet  Commonly known as:  ATIVAN  Take 0.5 mg by mouth daily as needed for anxiety.     polyethylene glycol packet  Commonly known  as:  MIRALAX / GLYCOLAX  Take 17 g by mouth daily as needed for mild constipation.     potassium chloride SA 20 MEQ tablet  Commonly known as:  K-DUR,KLOR-CON  Take 20 mEq by mouth. Take one table daily for potassium     sertraline 50 MG tablet  Commonly known as:  ZOLOFT  Take 50 mg by mouth daily. 1 daily for depression and anxiety     SYSTANE 0.4-0.3 % Soln  Generic drug:  Polyethyl Glycol-Propyl Glycol  Apply 1 drop to eye 2 (two) times daily.     vitamin B-12 1000 MCG tablet  Commonly known as:  CYANOCOBALAMIN  Take 1,000 mcg by mouth daily. One daily for B 12 supplement        Review of Systems  Constitutional: Negative for fever, chills and diaphoresis.       Gradual weight loss  HENT: Positive for hearing loss. Negative for congestion, ear pain and nosebleeds.   Eyes: Negative for pain, discharge and redness.  Respiratory: Negative for cough, shortness of breath and wheezing.   Cardiovascular: Positive for leg swelling. Negative for chest pain and palpitations.       Much improved, on trace edema in her left ankle seen  Gastrointestinal: Negative for nausea, vomiting, diarrhea and constipation.       Occasional nausea  Genitourinary: Positive for frequency. Negative for dysuria, urgency and flank pain.  Musculoskeletal: Positive for back pain. Negative for myalgias and neck pain.       Right lower back pain with walking or weight bearing on and off, positional.   Skin: Negative for rash.  Neurological: Positive for tremors. Negative for dizziness, seizures, weakness and headaches.  Psychiatric/Behavioral: The patient is nervous/anxious.     Immunization History  Administered Date(s) Administered  . Influenza Split 09/26/2011, 09/29/2012  . Influenza Whole 09/21/2007, 09/27/2008, 09/12/2009, 08/17/2010  . Influenza, High Dose Seasonal PF 09/22/2013  . Influenza,inj,Quad PF,36+ Mos 10/03/2014  . Influenza-Unspecified 09/06/2015  . PPD Test 06/01/2015  .  Pneumococcal Conjugate-13 01/05/2014  . Pneumococcal Polysaccharide-23 12/17/1995  . Tdap 04/16/2011  . Zoster 08/27/2011   Pertinent  Health Maintenance Due  Topic Date Due  . INFLUENZA VACCINE  07/16/2016  . DEXA SCAN  Completed  . PNA vac Low Risk Adult  Completed   Fall Risk  10/05/2015 06/05/2015 03/23/2015 02/23/2015 01/05/2014  Falls in the past year? No No Yes Yes No  Number falls in past yr: - - 1 1 -  Injury with Fall? - - Yes No -  Risk for fall due to : - - Impaired balance/gait - -   Functional Status Survey:    Filed Vitals:   05/27/16 1010  BP: 136/80  Pulse: 72  Temp: 98.4 F (36.9 C)  TempSrc: Oral  Resp: 20  Height: 5\' 1"  (1.549 m)  Weight: 97 lb 4.8 oz (44.135 kg)   Body mass index is 18.39 kg/(m^2). Physical Exam  Constitutional: She is oriented to person, place, and time. She appears well-developed and well-nourished. No distress.  HENT:  Head: Normocephalic and atraumatic.  Right Ear: External ear normal.  Left Ear: External ear normal.  Nose: Nose normal.  Mouth/Throat: Oropharynx is clear and moist. No oropharyngeal exudate.  Eyes: Conjunctivae and EOM are normal. Pupils are equal, round, and reactive to light. Right eye exhibits no discharge. Left eye exhibits no discharge. No scleral icterus.  Neck: Normal range of motion. Neck supple. No JVD present. No tracheal deviation present. No thyromegaly present.  Cardiovascular: Normal rate, regular rhythm, normal heart sounds and intact distal pulses.  Exam reveals no friction rub.   No murmur heard. Pulmonary/Chest: Effort normal and breath sounds normal. No stridor. No respiratory distress. She has no wheezes. She has no rales. She exhibits no tenderness.  Abdominal: Soft. Bowel sounds are normal. She exhibits no distension. There is no tenderness. There is no rebound and no guarding.  Musculoskeletal: Normal range of motion. She exhibits edema and tenderness.  Lower back pain refers to the right thigh  and knee--positional, Tylenol was requested Only trace edema seen in the left ankle.   Lymphadenopathy:    She has no cervical adenopathy.  Neurological: She is alert and oriented to person, place, and time. She has normal reflexes. No cranial nerve deficit. She exhibits normal muscle tone. Coordination abnormal.  Skin: Skin is warm and dry. No rash noted. She is not diaphoretic. No erythema. No pallor.  Discolored fingernails  Psychiatric: She has a normal mood and affect. Her behavior is normal. Judgment and thought content normal.  Forgetful, stated she slept better    Labs reviewed:  Recent Labs  12/19/15 12/26/15 02/01/16 02/27/16  NA 137  --  134* 134*  K 2.9* 3.5 3.6 3.8  BUN 17  --  2* 18  CREATININE 0.7  --  0.7 0.6    Recent Labs  12/19/15 02/01/16 02/27/16  AST 8* 8* 8*  ALT 3* 3* 3*  ALKPHOS 43 47 48    Recent Labs  12/19/15 02/01/16 02/27/16  WBC 11.3 7.3 12.6  HGB 11.6* 11.0* 10.7*  HCT 35* 33* 33*  PLT 244 240 296   Lab Results  Component Value Date   TSH 1.46 04/25/2015   No results found for: HGBA1C Lab Results  Component Value Date   CHOL 199 04/24/2012   HDL 69.30 04/24/2012   LDLCALC 105* 04/24/2012   LDLDIRECT 101.1 04/16/2011   TRIG 122.0 04/24/2012   CHOLHDL 3 04/24/2012    Significant Diagnostic Results in last 30 days:  No results found.  Assessment/Plan Fall at nursing home 05/25/16, no apparent injury. Lack of safety awareness and increased frailty contributory, close supervision for safety.    Essential hypertension Permissive blood pressure control, continue Hydralazine 25mg  bid since 10/05/15  GERD (gastroesophageal reflux disease) stable, continue Famotidine,  Zofran 4mg  prn   Constipation Stable, continue Senokot S II po qhs, Colace bid,  and started 03/06/15 prn MiaLax and MOM  Parkinsonian tremor (HCC) Continue Sinemet 25/100 tid, flat affect,  no disabling tremor, SNF for care needs.   Adjustment disorder with  anxious mood Mood is stabilized, continue Sertraline 50mg  daily and dc prn Lorazepam-not used    Edema Not apparent, off Furosemide, 02/27/16  Na 134, K 3.8, Bun 18, creat 0.59  Anemia Stable, Hgb 10-11, continue Vit B12 1013mcg po daily. 02/27/16 Hgb 10.7  Memory deficit Mild, forgetful, continue SNF for care needs.   Hypokalemia 12/26/15 K 3.5, 02/27/16 Na 134, K 3.8, continue Kcl supplement.   Adult failure to thrive 02/01/16 TP 4.7, albumin 2.7, continue supportive care.      Family/ staff Communication: continue SNF for care needs.   Labs/tests ordered: none

## 2016-05-27 NOTE — Assessment & Plan Note (Signed)
Continue Sinemet 25/100 tid, flat affect,  no disabling tremor, SNF for care needs.

## 2016-05-27 NOTE — Assessment & Plan Note (Signed)
12/26/15 K 3.5, 02/27/16 Na 134, K 3.8, continue Kcl supplement.

## 2016-05-27 NOTE — Assessment & Plan Note (Signed)
Stable, continue Senokot S II po qhs, Colace bid,  and started 03/06/15 prn MiaLax and MOM 

## 2016-05-27 NOTE — Assessment & Plan Note (Signed)
stable, continue Famotidine,  Zofran 4mg  prn

## 2016-05-27 NOTE — Assessment & Plan Note (Signed)
05/25/16, no apparent injury. Lack of safety awareness and increased frailty contributory, close supervision for safety.

## 2016-05-27 NOTE — Assessment & Plan Note (Signed)
Not apparent, off Furosemide, 02/27/16 Na 134, K 3.8, Bun 18, creat 0.59

## 2016-05-27 NOTE — Assessment & Plan Note (Signed)
Stable, Hgb 10-11, continue Vit B12 1041mcg po daily. 02/27/16 Hgb 10.7

## 2016-06-24 ENCOUNTER — Non-Acute Institutional Stay (SKILLED_NURSING_FACILITY): Payer: Medicare Other | Admitting: Internal Medicine

## 2016-06-24 ENCOUNTER — Encounter: Payer: Self-pay | Admitting: Internal Medicine

## 2016-06-24 DIAGNOSIS — F4322 Adjustment disorder with anxiety: Secondary | ICD-10-CM | POA: Diagnosis not present

## 2016-06-24 DIAGNOSIS — R627 Adult failure to thrive: Secondary | ICD-10-CM | POA: Diagnosis not present

## 2016-06-24 DIAGNOSIS — E876 Hypokalemia: Secondary | ICD-10-CM | POA: Diagnosis not present

## 2016-06-24 DIAGNOSIS — R413 Other amnesia: Secondary | ICD-10-CM

## 2016-06-24 DIAGNOSIS — D500 Iron deficiency anemia secondary to blood loss (chronic): Secondary | ICD-10-CM | POA: Diagnosis not present

## 2016-06-24 DIAGNOSIS — I10 Essential (primary) hypertension: Secondary | ICD-10-CM | POA: Diagnosis not present

## 2016-06-24 DIAGNOSIS — R112 Nausea with vomiting, unspecified: Secondary | ICD-10-CM | POA: Diagnosis not present

## 2016-06-24 NOTE — Progress Notes (Signed)
Patient ID: Kimberly Silva, female   DOB: 1926/10/30, 82 y.o.   MRN: [id number]  Location:   [place] Room Number: N01 Place of Service:  SNF (31) Provider:  Estill Dooms, MD  Patient Care Team: Estill Dooms, MD as PCP - General (Internal Medicine) Lafayette Dragon, MD (Gastroenterology) Penni Bombard, MD (Neurology) Rolm Bookbinder, MD as Attending Physician (Dermatology) Clent Jacks, MD (Ophthalmology) Belva Crome, MD as Consulting Physician (Cardiology) Evans Lance, MD as Consulting Physician (Cardiology) Reyne Dumas, MD as Consulting Physician (Internal Medicine) Suella Broad, MD as Consulting Physician (Physical Medicine and Rehabilitation) Man Otho Darner, NP as Nurse Practitioner (Nurse Practitioner)  Extended Emergency Contact Information Primary Emergency Contact: [facility] Address: [place], [place] [address] Johnnette Litter of [place] Phone: [phone] Relation: Other Secondary Emergency Contact: [name] Address: [address]          [place], [place] [address] Johnnette Litter of [place] Phone: [phone] Mobile Phone: [phone] Relation: Daughter  Code Status:  DNR Goals of care: Advanced Directive information Advanced Directives 06/24/2016  Does patient have an advance directive? Yes  Type of Paramedic of [place];Out of facility DNR (pink MOST or yellow form)  Does patient want to make changes to advanced directive? -  Copy of advanced directive(s) in chart? Yes  Pre-existing out of facility DNR order (yellow form or pink MOST form) Yellow form placed in chart (order not valid for inpatient use)     Chief Complaint  Patient presents with  . Medical Management of Chronic Issues    medication management, routine    HPI:  Pt is a 82 y.o. female seen today for medical management of chronic diseases.    Nausea and vomiting, intractability of vomiting not specified,  unspecified vomiting type - Denies abdominal pain. Claims that she has nausea and occasional vomiting almost every morning. Denies dysuria, fever, dyspnea, or blood in emesis or stool.  Essential hypertension - now running slightly low.  Memory deficit - unchanged  Adult failure to thrive - poor appetite and nausea  Hypokalemia - normal potassium. Not on diuretics.  Iron deficiency anemia due to chronic blood loss - has not been checked since March 2017  Adjustment disorder with anxious mood - stable to somewhat improved     Past Medical History  Diagnosis Date  . HYPERLIPIDEMIA 02/21/2009  . HYPOKALEMIA 08/17/2007  . ANEMIA, B12 DEFICIENCY 03/13/2009  . HYPERTENSION 05/14/2007  . Blood in stool 01/25/2008  . OSTEOARTHRITIS 05/14/2007  . OSTEOPENIA 05/14/2007    2007 hx of fosamax use   . TREMOR 07/08/2007  . FREQUENCY, URINARY 08/19/2008  . LUMBAR STRAIN 09/21/2007  . Gastric polyp     panendo  5 2009  . History of ankle fracture   . Parkinson's disease   . Adenomatous colon polyp   . Lumbar compression fracture ([place])   . GERD (gastroesophageal reflux disease)   . Edema   . Iron deficiency anemia   . Anxiety   . Constipation   . Hemorrhoid   . Psoriasis   . Hearing loss   . Memory deficit 06/05/2015  . Benign neoplasm of colon 07/19/2013  . Candidiasis of skin 10/09/2015  . Clostridium difficile colitis 02/05/2016    02/01/16 C-diff GDG detected, C-diff toxin A/B not detected x3, Flagyl 500mg  q8h x 10 days started since 02/02/16 in setting of diarrhea. Observe the patient.    Marland Kitchen  Complete heart block ([place]) 08/22/2014  . Compression fracture of L3 lumbar vertebra ([place]) 12/14/2014    Patient received epidural steroid injection last week for this Patient has been told that she is not a candidate for surgery for lumbar spinal stenosis Does not like Vicodin, recommend continuing tramadol and Tylenol for pain control PT/OT eval recommended SNF    . Decreased hearing 04/17/2011   Past Surgical  History  Procedure Laterality Date  . Abdominal hysterectomy  1974  . Fracture  2007    left ankle- Hx of fosamax use  . Dilation and curettage of uterus    . Cataract extraction, bilateral  20000  . Colonoscopy N/A 07/19/2013    Procedure: COLONOSCOPY;  Surgeon: Lafayette Dragon, MD;  Location: [facility];  Service: Endoscopy;  Laterality: N/A;  . Breast cyst aspiration      No Known Allergies    Medication List       This list is accurate as of: 06/24/16 10:28 AM.  Always use your most recent med list.               acetaminophen 325 MG tablet  Commonly known as:  TYLENOL  Take 650 mg by mouth every 6 (six) hours as needed for mild pain, moderate pain or fever.     carbidopa-levodopa 25-100 MG tablet  Commonly known as:  SINEMET IR  Take 1 tablet by mouth 3 (three) times daily.     famotidine 20 MG tablet  Commonly known as:  PEPCID  Take 20 mg by mouth. 1 at bedtime to reduce stomach acid     hydrALAZINE 25 MG tablet  Commonly known as:  APRESOLINE  One twice daily to control blood pressure     nystatin-triamcinolone cream  Commonly known as:  MYCOLOG II  Apply 1 application topically 2 (two) times daily. Right and left groin and intergluteal cleft until redness healed     ondansetron 4 MG tablet  Commonly known as:  ZOFRAN  Take 4 mg by mouth. Take one tablet every 6 hours as needed     polyethylene glycol packet  Commonly known as:  MIRALAX / GLYCOLAX  Take 17 g by mouth daily as needed for mild constipation.     potassium chloride SA 20 MEQ tablet  Commonly known as:  K-DUR,KLOR-CON  Take 20 mEq by mouth. Take one table daily for potassium     sertraline 50 MG tablet  Commonly known as:  ZOLOFT  Take 50 mg by mouth daily. 1 daily for depression and anxiety     SYSTANE 0.4-0.3 % Soln  Generic drug:  Polyethyl Glycol-Propyl Glycol  Apply 1 drop to eye 2 (two) times daily.     vitamin B-12 1000 MCG tablet  Commonly known as:  CYANOCOBALAMIN  Take 1,000  mcg by mouth daily. One daily for B 12 supplement        Review of Systems  Constitutional: Negative for fever, chills and diaphoresis.       Gradual weight loss  HENT: Positive for hearing loss. Negative for congestion, ear pain and nosebleeds.   Eyes: Negative for pain, discharge and redness.  Respiratory: Negative for cough, shortness of breath and wheezing.   Cardiovascular: Positive for leg swelling (Much improved, no edema in her left ankle seen). Negative for chest pain and palpitations.  Gastrointestinal: Negative for nausea, vomiting, diarrhea and constipation.       Occasional nausea  Genitourinary: Positive for frequency. Negative for dysuria, urgency and  flank pain.  Musculoskeletal: Positive for back pain. Negative for myalgias and neck pain.       Right lower back pain with walking or weight bearing on and off, positional.   Skin: Negative for rash.  Neurological: Positive for tremors. Negative for dizziness, seizures, weakness and headaches.  Psychiatric/Behavioral: The patient is not nervous/anxious.     Immunization History  Administered Date(s) Administered  . Influenza Split 09/26/2011, 09/29/2012  . Influenza Whole 09/21/2007, 09/27/2008, 09/12/2009, 08/17/2010  . Influenza, High Dose Seasonal PF 09/22/2013  . Influenza,inj,Quad PF,36+ Mos 10/03/2014  . Influenza-Unspecified 09/06/2015  . PPD Test 06/01/2015  . Pneumococcal Conjugate-13 01/05/2014  . Pneumococcal Polysaccharide-23 12/17/1995  . Tdap 04/16/2011  . Zoster 08/27/2011   Pertinent  Health Maintenance Due  Topic Date Due  . INFLUENZA VACCINE  07/16/2016  . DEXA SCAN  Completed  . PNA vac Low Risk Adult  Completed   Fall Risk  10/05/2015 06/05/2015 03/23/2015 02/23/2015 01/05/2014  Falls in the past year? No No Yes Yes No  Number falls in past yr: - - 1 1 -  Injury with Fall? - - Yes No -  Risk for fall due to : - - Impaired balance/gait - -   Functional Status Survey: Is the patient deaf or  have difficulty hearing?: Yes Does the patient have difficulty seeing, even when wearing glasses/contacts?: Yes Does the patient have difficulty concentrating, remembering, or making decisions?: Yes Does the patient have difficulty walking or climbing stairs?: Yes Does the patient have difficulty dressing or bathing?: Yes Does the patient have difficulty doing errands alone such as visiting a doctor's office or shopping?: Yes  Filed Vitals:   06/24/16 1011  BP: 106/64  Pulse: 58  Temp: 96.4 F (35.8 C)  Resp: 16  Height: 5\' 1"  (1.549 m)  Weight: 94 lb (42.638 kg)   Body mass index is 17.77 kg/(m^2). Physical Exam  Constitutional: No distress.  Thin and frail  HENT:  Head: Normocephalic and atraumatic.  Right Ear: External ear normal.  Left Ear: External ear normal.  Nose: Nose normal.  Mouth/Throat: Oropharynx is clear and moist. No oropharyngeal exudate.  Eyes: Conjunctivae and EOM are normal. Pupils are equal, round, and reactive to light. Right eye exhibits no discharge. Left eye exhibits no discharge. No scleral icterus.  Neck: Normal range of motion. Neck supple. No JVD present. No tracheal deviation present. No thyromegaly present.  Cardiovascular: Normal rate, regular rhythm, normal heart sounds and intact distal pulses.  Exam reveals no friction rub.   No murmur heard. Pulmonary/Chest: Effort normal and breath sounds normal. No stridor. No respiratory distress. She has no wheezes. She has no rales. She exhibits no tenderness.  Abdominal: Soft. Bowel sounds are normal. She exhibits no distension. There is no tenderness. There is no rebound and no guarding.  Musculoskeletal: Normal range of motion. She exhibits no edema or tenderness.  Lower back pain, right thigh,  and knee pain.  Lymphadenopathy:    She has no cervical adenopathy.  Neurological: She is alert. She has normal reflexes. No cranial nerve deficit. She exhibits normal muscle tone. Coordination abnormal.  Skin:  Skin is warm and dry. No rash noted. She is not diaphoretic. No erythema. No pallor.  Discolored fingernails  Psychiatric: She has a normal mood and affect. Her behavior is normal. Thought content normal.  Forgetful, stated she slept better    Labs reviewed:  Recent Labs  12/19/15 12/26/15 02/01/16 02/27/16  NA 137  --  134* 134*  K 2.9* 3.5 3.6 3.8  BUN 17  --  2* 18  CREATININE 0.7  --  0.7 0.6    Recent Labs  12/19/15 02/01/16 02/27/16  AST 8* 8* 8*  ALT 3* 3* 3*  ALKPHOS 43 47 48    Recent Labs  12/19/15 02/01/16 02/27/16  WBC 11.3 7.3 12.6  HGB 11.6* 11.0* 10.7*  HCT 35* 33* 33*  PLT 244 240 296   Lab Results  Component Value Date   TSH 1.46 04/25/2015   No results found for: HGBA1C Lab Results  Component Value Date   CHOL 199 04/24/2012   HDL 69.30 04/24/2012   LDLCALC 105* 04/24/2012   LDLDIRECT 101.1 04/16/2011   TRIG 122.0 04/24/2012   CHOLHDL 3 04/24/2012    Significant Diagnostic Results in last 30 days:  No results found.  Assessment/Plan  1. Nausea and vomiting, intractability of vomiting not specified, unspecified vomiting type -CMP, amylase, lipase. Discontinued several medications that theoretically could contribute to the nausea.  2. Essential hypertension -Discontinue hydralazine  3. Memory deficit Unchanged  4. Adult failure to thrive Unchanged  5. Hypokalemia Discontinue KCl  6. Iron deficiency anemia due to chronic blood loss -CBC  7. Adjustment disorder with anxious mood -Reduce sertraline 25 mg 14 days, then discontinue.

## 2016-06-25 LAB — BASIC METABOLIC PANEL
BUN: 19 mg/dL (ref 4–21)
Creatinine: 0.6 mg/dL (ref <=1.1)
GLUCOSE: 68 mg/dL
Potassium: 3.6 mmol/L (ref 3.4–5.3)
SODIUM: 134 mmol/L — AB (ref 137–147)

## 2016-06-25 LAB — HEPATIC FUNCTION PANEL
ALT: 3 U/L — AB (ref 7–35)
AST: 8 U/L — AB (ref 13–35)
Alkaline Phosphatase: 92 U/L (ref 25–125)
BILIRUBIN, TOTAL: 0.4 mg/dL

## 2016-06-25 LAB — CBC AND DIFFERENTIAL
HEMATOCRIT: 34 % — AB (ref 36–46)
HEMOGLOBIN: 10.9 g/dL — AB (ref 12.0–16.0)
PLATELETS: 263 10*3/uL (ref 150–399)
WBC: 5.6 10*3/mL

## 2016-07-11 ENCOUNTER — Other Ambulatory Visit: Payer: Self-pay | Admitting: *Deleted

## 2016-07-19 ENCOUNTER — Encounter: Payer: Self-pay | Admitting: Nurse Practitioner

## 2016-07-19 ENCOUNTER — Non-Acute Institutional Stay (SKILLED_NURSING_FACILITY): Payer: Medicare Other | Admitting: Nurse Practitioner

## 2016-07-19 DIAGNOSIS — R413 Other amnesia: Secondary | ICD-10-CM

## 2016-07-19 DIAGNOSIS — R609 Edema, unspecified: Secondary | ICD-10-CM

## 2016-07-19 DIAGNOSIS — G20 Parkinson's disease: Secondary | ICD-10-CM

## 2016-07-19 DIAGNOSIS — I10 Essential (primary) hypertension: Secondary | ICD-10-CM

## 2016-07-19 DIAGNOSIS — D518 Other vitamin B12 deficiency anemias: Secondary | ICD-10-CM | POA: Diagnosis not present

## 2016-07-19 DIAGNOSIS — K219 Gastro-esophageal reflux disease without esophagitis: Secondary | ICD-10-CM

## 2016-07-19 DIAGNOSIS — D649 Anemia, unspecified: Secondary | ICD-10-CM | POA: Diagnosis not present

## 2016-07-19 DIAGNOSIS — F4322 Adjustment disorder with anxiety: Secondary | ICD-10-CM | POA: Diagnosis not present

## 2016-07-19 DIAGNOSIS — K5900 Constipation, unspecified: Secondary | ICD-10-CM

## 2016-07-19 NOTE — Assessment & Plan Note (Signed)
Not apparent, off Furosemide, 02/27/16 Na 134, K 3.8, Bun 18, creat 0.59

## 2016-07-19 NOTE — Assessment & Plan Note (Signed)
stable, continue Famotidine,  Zofran 4mg  prn

## 2016-07-19 NOTE — Assessment & Plan Note (Signed)
Mood is stabilized

## 2016-07-19 NOTE — Assessment & Plan Note (Signed)
Permissive blood pressure control, continue Hydralazine 25mg  bid since 10/05/15

## 2016-07-19 NOTE — Assessment & Plan Note (Signed)
Stable, continue prn MiraLax 

## 2016-07-19 NOTE — Assessment & Plan Note (Signed)
Continue Sinemet 25/100 tid, flat affect,  no disabling tremor, SNF for care needs.

## 2016-07-19 NOTE — Assessment & Plan Note (Signed)
Stable, Hgb 10-11, continue Vit B12 1092mcg po daily. 02/27/16 Hgb 10.7

## 2016-07-19 NOTE — Progress Notes (Signed)
Patient ID: Kimberly Silva, female   DOB: 1926/10/03, 82 y.o.   MRN: [id number]  Location:   [facility] Room Number: 1 Place of Service:  SNF (31)SNF Rm 01 Provider: Marlana Latus NP    Patient Care Team: Estill Dooms, MD as PCP - General (Internal Medicine) Lafayette Dragon, MD (Gastroenterology) Penni Bombard, MD (Neurology) Rolm Bookbinder, MD as Attending Physician (Dermatology) Clent Jacks, MD (Ophthalmology) Belva Crome, MD as Consulting Physician (Cardiology) Evans Lance, MD as Consulting Physician (Cardiology) Reyne Dumas, MD as Consulting Physician (Internal Medicine) Suella Broad, MD as Consulting Physician (Physical Medicine and Rehabilitation) Zelena Bushong Otho Darner, NP as Nurse Practitioner (Nurse Practitioner)  Extended Emergency Contact Information Primary Emergency Contact: [facility] Address: [place], [place] [address] Johnnette Litter of [place] Phone: [phone] Relation: Other Secondary Emergency Contact: [name] Address: [address]          [place],  [address] Johnnette Litter of [place] Phone: [phone] Mobile Phone: [phone] Relation: Daughter  Code Status:  DNR Goals of care: Advanced Directive information Advanced Directives 07/19/2016  Does patient have an advance directive? Yes  Type of Paramedic of [place];Out of facility DNR (pink MOST or yellow form)  Does patient want to make changes to advanced directive? No - Patient declined  Copy of advanced directive(s) in chart? Yes  Would patient like information on creating an advanced directive? -  Pre-existing out of facility DNR order (yellow form or pink MOST form) -     Chief Complaint  Patient presents with  . Acute Visit    skin tear right elbow    HPI:  Pt is a 82 y.o. female seen today for managing chronic medical conditions. Hx of depression, stable while on Sertraline 50mg , blood pressure is controlled  on Hydrolazine 25mg  bid, Parkinson's, mild tremor in hands, not disabling while on Sinemet 25/100tid.     Past Medical History:  Diagnosis Date  . Adenomatous colon polyp   . ANEMIA, B12 DEFICIENCY 03/13/2009  . Anxiety   . Benign neoplasm of colon 07/19/2013  . Blood in stool 01/25/2008  . Candidiasis of skin 10/09/2015  . Clostridium difficile colitis 02/05/2016   02/01/16 C-diff GDG detected, C-diff toxin A/B not detected x3, Flagyl 500mg  q8h x 10 days started since 02/02/16 in setting of diarrhea. Observe the patient.    . Complete heart block ([place]) 08/22/2014  . Compression fracture of L3 lumbar vertebra ([place]) 12/14/2014   Patient received epidural steroid injection last week for this Patient has been told that she is not a candidate for surgery for lumbar spinal stenosis Does not like Vicodin, recommend continuing tramadol and Tylenol for pain control PT/OT eval recommended SNF    . Constipation   . Decreased hearing 04/17/2011  . Edema   . FREQUENCY, URINARY 08/19/2008  . Gastric polyp    panendo  5 2009  . GERD (gastroesophageal reflux disease)   . Hearing loss   . Hemorrhoid   . History of ankle fracture   . HYPERLIPIDEMIA 02/21/2009  . HYPERTENSION 05/14/2007  . HYPOKALEMIA 08/17/2007  . Iron deficiency anemia   . Lumbar compression fracture ([place])   . LUMBAR STRAIN 09/21/2007  . Memory deficit 06/05/2015  . OSTEOARTHRITIS 05/14/2007  . OSTEOPENIA 05/14/2007   2007 hx of fosamax use   . Parkinson's disease   . Psoriasis   . TREMOR 07/08/2007  Past Surgical History:  Procedure Laterality Date  . ABDOMINAL HYSTERECTOMY  1974  . BREAST CYST ASPIRATION    . CATARACT EXTRACTION, BILATERAL  [address]  . COLONOSCOPY N/A 07/19/2013   Procedure: COLONOSCOPY;  Surgeon: Lafayette Dragon, MD;  Location: [facility];  Service: Endoscopy;  Laterality: N/A;  . DILATION AND CURETTAGE OF UTERUS    . fracture  2007   left ankle- Hx of fosamax use    No Known Allergies    Medication List         Accurate as of 07/19/16 11:59 PM. Always use your most recent med list.          acetaminophen 325 MG tablet Commonly known as:  TYLENOL Take 650 mg by mouth every 6 (six) hours as needed for mild pain, moderate pain or fever.   carbidopa-levodopa 25-100 MG tablet Commonly known as:  SINEMET IR Take 1 tablet by mouth 3 (three) times daily.   famotidine 20 MG tablet Commonly known as:  PEPCID Take 20 mg by mouth. 1 at bedtime to reduce stomach acid   nystatin-triamcinolone cream Commonly known as:  MYCOLOG II Apply 1 application topically 2 (two) times daily. Right and left groin and intergluteal cleft until redness healed   ondansetron 4 MG tablet Commonly known as:  ZOFRAN Take 4 mg by mouth. Take one tablet every 6 hours as needed   polyethylene glycol packet Commonly known as:  MIRALAX / GLYCOLAX Take 17 g by mouth daily as needed for mild constipation.   SYSTANE 0.4-0.3 % Soln Generic drug:  Polyethyl Glycol-Propyl Glycol Apply 1 drop to eye 2 (two) times daily.   vitamin B-12 1000 MCG tablet Commonly known as:  CYANOCOBALAMIN Take 1,000 mcg by mouth daily. One daily for B 12 supplement       Review of Systems  Constitutional: Negative for chills, diaphoresis and fever.       Gradual weight loss  HENT: Positive for hearing loss. Negative for congestion, ear pain and nosebleeds.   Eyes: Negative for pain, discharge and redness.  Respiratory: Negative for cough, shortness of breath and wheezing.   Cardiovascular: Positive for leg swelling. Negative for chest pain and palpitations.       Much improved, on trace edema in her left ankle seen  Gastrointestinal: Negative for constipation, diarrhea, nausea and vomiting.       Occasional nausea  Genitourinary: Positive for frequency. Negative for dysuria, flank pain and urgency.  Musculoskeletal: Positive for back pain. Negative for myalgias and neck pain.       Right lower back pain with walking or weight bearing on  and off, positional.   Skin: Negative for rash.       Right elbow skin tear, no s/s of infection  Neurological: Positive for tremors. Negative for dizziness, seizures, weakness and headaches.  Psychiatric/Behavioral: The patient is nervous/anxious.     Immunization History  Administered Date(s) Administered  . Influenza Split 09/26/2011, 09/29/2012  . Influenza Whole 09/21/2007, 09/27/2008, 09/12/2009, 08/17/2010  . Influenza, High Dose Seasonal PF 09/22/2013  . Influenza,inj,Quad PF,36+ Mos 10/03/2014  . Influenza-Unspecified 09/06/2015  . PPD Test 06/01/2015  . Pneumococcal Conjugate-13 01/05/2014  . Pneumococcal Polysaccharide-23 12/17/1995  . Tdap 04/16/2011  . Zoster 08/27/2011   Pertinent  Health Maintenance Due  Topic Date Due  . INFLUENZA VACCINE  07/16/2016  . DEXA SCAN  Completed  . PNA vac Low Risk Adult  Completed   Fall Risk  06/24/2016 10/05/2015 06/05/2015 03/23/2015 02/23/2015  Falls in the past year? Yes No No Yes Yes  Number falls in past yr: 1 - - 1 1  Injury with Fall? No - - Yes No  Risk for fall due to : - - - Impaired balance/gait -   Functional Status Survey:    Vitals:   07/19/16 1343  BP: (!) 153/88  Pulse: 67  Resp: 18  Temp: 97.8 F (36.6 C)  Weight: 92 lb 1.6 oz (41.8 kg)  Height: 5\' 1"  (1.549 m)   Body mass index is 17.4 kg/m. Physical Exam  Constitutional: She is oriented to person, place, and time. She appears well-developed and well-nourished. No distress.  HENT:  Head: Normocephalic and atraumatic.  Right Ear: External ear normal.  Left Ear: External ear normal.  Nose: Nose normal.  Mouth/Throat: Oropharynx is clear and moist. No oropharyngeal exudate.  Eyes: Conjunctivae and EOM are normal. Pupils are equal, round, and reactive to light. Right eye exhibits no discharge. Left eye exhibits no discharge. No scleral icterus.  Neck: Normal range of motion. Neck supple. No JVD present. No tracheal deviation present. No thyromegaly  present.  Cardiovascular: Normal rate, regular rhythm, normal heart sounds and intact distal pulses.  Exam reveals no friction rub.   No murmur heard. Pulmonary/Chest: Effort normal and breath sounds normal. No stridor. No respiratory distress. She has no wheezes. She has no rales. She exhibits no tenderness.  Abdominal: Soft. Bowel sounds are normal. She exhibits no distension. There is no tenderness. There is no rebound and no guarding.  Musculoskeletal: Normal range of motion. She exhibits edema and tenderness.  Lower back pain refers to the right thigh and knee--positional, Tylenol was requested Only trace edema seen in the left ankle.   Lymphadenopathy:    She has no cervical adenopathy.  Neurological: She is alert and oriented to person, place, and time. She has normal reflexes. No cranial nerve deficit. She exhibits normal muscle tone. Coordination abnormal.  Skin: Skin is warm and dry. No rash noted. She is not diaphoretic. No erythema. No pallor.  Discolored fingernails. Skin tear R elbow, no s/s of infection.   Psychiatric: She has a normal mood and affect. Her behavior is normal. Judgment and thought content normal.  Forgetful, stated she slept better    Labs reviewed:  Recent Labs  02/01/16 02/27/16 06/25/16  NA 134* 134* 134*  K 3.6 3.8 3.6  BUN 2* 18 19  CREATININE 0.7 0.6 0.6    Recent Labs  02/01/16 02/27/16 06/25/16  AST 8* 8* 8*  ALT 3* 3* 3*  ALKPHOS 47 48 92    Recent Labs  02/01/16 02/27/16 06/25/16  WBC 7.3 12.6 5.6  HGB 11.0* 10.7* 10.9*  HCT 33* 33* 34*  PLT 240 296 263   Lab Results  Component Value Date   TSH 1.46 04/25/2015   No results found for: HGBA1C Lab Results  Component Value Date   CHOL 199 04/24/2012   HDL 69.30 04/24/2012   LDLCALC 105 (H) 04/24/2012   LDLDIRECT 101.1 04/16/2011   TRIG 122.0 04/24/2012   CHOLHDL 3 04/24/2012    Significant Diagnostic Results in last 30 days:  No results found.  Assessment/Plan Essential  hypertension Permissive blood pressure control, continue Hydralazine 25mg  bid since 10/05/15  GERD (gastroesophageal reflux disease) stable, continue Famotidine,  Zofran 4mg  prn  Constipation Stable, continue prn MiraLax  Parkinsonian tremor (HCC) Continue Sinemet 25/100 tid, flat affect,  no disabling tremor, SNF for care needs.    Adjustment disorder  with anxious mood Mood is stabilized     Edema Not apparent, off Furosemide, 02/27/16 Na 134, K 3.8, Bun 18, creat 0.59   Anemia Stable, Hgb 10-11, continue Vit B12 1047mcg po daily. 02/27/16 Hgb 10.7  Memory deficit Mild, forgetful, continue SNF for care needs.   ANEMIA, B12 DEFICIENCY Stable, Hgb 10-11, continue Vit B12 1056mcg po daily. 02/27/16 Hgb 10.7    Family/ staff Communication: continue SNF for care needs.   Labs/tests ordered: none

## 2016-07-19 NOTE — Assessment & Plan Note (Signed)
Mild, forgetful, continue SNF for care needs.

## 2016-07-19 NOTE — Assessment & Plan Note (Signed)
Stable, Hgb 10-11, continue Vit B12 1016mcg po daily. 02/27/16 Hgb 10.7

## 2016-08-13 ENCOUNTER — Encounter: Payer: Self-pay | Admitting: Internal Medicine

## 2016-08-21 ENCOUNTER — Encounter: Payer: Self-pay | Admitting: Nurse Practitioner

## 2016-08-21 ENCOUNTER — Non-Acute Institutional Stay (SKILLED_NURSING_FACILITY): Payer: Medicare Other | Admitting: Nurse Practitioner

## 2016-08-21 DIAGNOSIS — R627 Adult failure to thrive: Secondary | ICD-10-CM | POA: Diagnosis not present

## 2016-08-21 DIAGNOSIS — I10 Essential (primary) hypertension: Secondary | ICD-10-CM

## 2016-08-21 DIAGNOSIS — K219 Gastro-esophageal reflux disease without esophagitis: Secondary | ICD-10-CM | POA: Diagnosis not present

## 2016-08-21 DIAGNOSIS — F4322 Adjustment disorder with anxiety: Secondary | ICD-10-CM

## 2016-08-21 DIAGNOSIS — G20 Parkinson's disease: Secondary | ICD-10-CM

## 2016-08-21 DIAGNOSIS — D649 Anemia, unspecified: Secondary | ICD-10-CM

## 2016-08-21 DIAGNOSIS — R112 Nausea with vomiting, unspecified: Secondary | ICD-10-CM

## 2016-08-21 DIAGNOSIS — M150 Primary generalized (osteo)arthritis: Secondary | ICD-10-CM

## 2016-08-21 DIAGNOSIS — K5900 Constipation, unspecified: Secondary | ICD-10-CM | POA: Diagnosis not present

## 2016-08-21 DIAGNOSIS — M159 Polyosteoarthritis, unspecified: Secondary | ICD-10-CM

## 2016-08-21 DIAGNOSIS — R413 Other amnesia: Secondary | ICD-10-CM

## 2016-08-21 DIAGNOSIS — R609 Edema, unspecified: Secondary | ICD-10-CM | POA: Diagnosis not present

## 2016-08-21 NOTE — Assessment & Plan Note (Signed)
Stable, continue prn MiraLax 

## 2016-08-21 NOTE — Progress Notes (Signed)
Patient ID: Kimberly Silva, female   DOB: [birth year], 82 y.o.   MRN: [id number]  Location:   [facility]   Place of Service:   SNF Rm 01  Provider: Marlana Latus NP    Patient Care Team: Estill Dooms, MD as PCP - General (Internal Medicine) Lafayette Dragon, MD (Gastroenterology) Penni Bombard, MD (Neurology) Rolm Bookbinder, MD as Attending Physician (Dermatology) Clent Jacks, MD (Ophthalmology) Belva Crome, MD as Consulting Physician (Cardiology) Evans Lance, MD as Consulting Physician (Cardiology) Reyne Dumas, MD as Consulting Physician (Internal Medicine) Suella Broad, MD as Consulting Physician (Physical Medicine and Rehabilitation) Emalynn Clewis Otho Darner, NP as Nurse Practitioner (Nurse Practitioner)  Extended Emergency Contact Information Primary Emergency Contact: [facility] Address: [place], [place] [address] Johnnette Litter of [place] Phone: [phone] Relation: Other Secondary Emergency Contact: [name] Address: [place]          [place], [place] [address] Johnnette Litter of [place] Phone: [phone] Mobile Phone: [phone] Relation: Daughter  Code Status:  DNR Goals of care: Advanced Directive information Advanced Directives 07/19/2016  Does patient have an advance directive? Yes  Type of Paramedic of [place];Out of facility DNR (pink MOST or yellow form)  Does patient want to make changes to advanced directive? No - Patient declined  Copy of advanced directive(s) in chart? Yes  Would patient like information on creating an advanced directive? -  Pre-existing out of facility DNR order (yellow form or pink MOST form) -     Chief Complaint  Patient presents with  . Medical Management of Chronic Issues    HPI:  Pt is a 82 y.o. female seen today for managing chronic medical conditions.    Hx of depression, stable while on Sertraline 50mg , blood pressure is controlled on Hydrolazine 25mg  bid,  Parkinson's, mild tremor in hands, not disabling while on Sinemet 25/100tid.     Past Medical History:  Diagnosis Date  . Adenomatous colon polyp   . ANEMIA, B12 DEFICIENCY 03/13/2009  . Anxiety   . Benign neoplasm of colon 07/19/2013  . Blood in stool 01/25/2008  . Candidiasis of skin 10/09/2015  . Clostridium difficile colitis 02/05/2016   02/01/16 C-diff GDG detected, C-diff toxin A/B not detected x3, Flagyl 500mg  q8h x 10 days started since 02/02/16 in setting of diarrhea. Observe the patient.    . Complete heart block ([place]) 08/22/2014  . Compression fracture of L3 lumbar vertebra ([place]) 12/14/2014   Patient received epidural steroid injection last week for this Patient has been told that she is not a candidate for surgery for lumbar spinal stenosis Does not like Vicodin, recommend continuing tramadol and Tylenol for pain control PT/OT eval recommended SNF    . Constipation   . Decreased hearing 04/17/2011  . Edema   . FREQUENCY, URINARY 08/19/2008  . Gastric polyp    panendo  5 2009  . GERD (gastroesophageal reflux disease)   . Hearing loss   . Hemorrhoid   . History of ankle fracture   . HYPERLIPIDEMIA 02/21/2009  . HYPERTENSION 05/14/2007  . HYPOKALEMIA 08/17/2007  . Iron deficiency anemia   . Lumbar compression fracture ([place])   . LUMBAR STRAIN 09/21/2007  . Memory deficit 06/05/2015  . OSTEOARTHRITIS 05/14/2007  . OSTEOPENIA 05/14/2007   2007 hx of fosamax use   . Parkinson's disease   . Psoriasis   . TREMOR 07/08/2007   Past Surgical History:  Procedure Laterality Date  . ABDOMINAL HYSTERECTOMY  1974  . BREAST CYST ASPIRATION    . CATARACT EXTRACTION, BILATERAL  [address]  . COLONOSCOPY N/A 07/19/2013   Procedure: COLONOSCOPY;  Surgeon: Lafayette Dragon, MD;  Location: [facility];  Service: Endoscopy;  Laterality: N/A;  . DILATION AND CURETTAGE OF UTERUS    . fracture  2007   left ankle- Hx of fosamax use    No Known Allergies    Medication List       Accurate as of 08/21/16  3:36  PM. Always use your most recent med list.          acetaminophen 325 MG tablet Commonly known as:  TYLENOL Take 650 mg by mouth every 6 (six) hours as needed for mild pain, moderate pain or fever.   carbidopa-levodopa 25-100 MG tablet Commonly known as:  SINEMET IR Take 1 tablet by mouth 3 (three) times daily.   famotidine 20 MG tablet Commonly known as:  PEPCID Take 20 mg by mouth. 1 at bedtime to reduce stomach acid   nystatin-triamcinolone cream Commonly known as:  MYCOLOG II Apply 1 application topically 2 (two) times daily. Right and left groin and intergluteal cleft until redness healed   ondansetron 4 MG tablet Commonly known as:  ZOFRAN Take 4 mg by mouth. Take one tablet every 6 hours as needed   polyethylene glycol packet Commonly known as:  MIRALAX / GLYCOLAX Take 17 g by mouth daily as needed for mild constipation.   SYSTANE 0.4-0.3 % Soln Generic drug:  Polyethyl Glycol-Propyl Glycol Apply 1 drop to eye 2 (two) times daily.   vitamin B-12 1000 MCG tablet Commonly known as:  CYANOCOBALAMIN Take 1,000 mcg by mouth daily. One daily for B 12 supplement       Review of Systems  Constitutional: Negative for chills, diaphoresis and fever.       Gradual weight loss  HENT: Positive for hearing loss. Negative for congestion, ear pain and nosebleeds.   Eyes: Negative for pain, discharge and redness.  Respiratory: Negative for cough, shortness of breath and wheezing.   Cardiovascular: Positive for leg swelling. Negative for chest pain and palpitations.       Much improved, on trace edema in her left ankle seen  Gastrointestinal: Negative for constipation, diarrhea, nausea and vomiting.       Occasional nausea  Genitourinary: Positive for frequency. Negative for dysuria, flank pain and urgency.  Musculoskeletal: Positive for back pain. Negative for myalgias and neck pain.       Right lower back pain with walking or weight bearing on and off, positional.   Skin:  Negative for rash.       Right elbow skin tear, no s/s of infection  Neurological: Positive for tremors. Negative for dizziness, seizures, weakness and headaches.  Psychiatric/Behavioral: The patient is nervous/anxious.     Immunization History  Administered Date(s) Administered  . Influenza Split 09/26/2011, 09/29/2012  . Influenza Whole 09/21/2007, 09/27/2008, 09/12/2009, 08/17/2010  . Influenza, High Dose Seasonal PF 09/22/2013  . Influenza,inj,Quad PF,36+ Mos 10/03/2014  . Influenza-Unspecified 09/06/2015  . PPD Test 06/01/2015  . Pneumococcal Conjugate-13 01/05/2014  . Pneumococcal Polysaccharide-23 12/17/1995  . Tdap 04/16/2011  . Zoster 08/27/2011   Pertinent  Health Maintenance Due  Topic Date Due  . INFLUENZA VACCINE  07/16/2016  . DEXA SCAN  Completed  . PNA vac Low Risk Adult  Completed   Fall Risk  06/24/2016 10/05/2015 06/05/2015 03/23/2015 02/23/2015  Falls in the past  year? Yes No No Yes Yes  Number falls in past yr: 1 - - 1 1  Injury with Fall? No - - Yes No  Risk for fall due to : - - - Impaired balance/gait -   Functional Status Survey:    Vitals:   08/21/16 1525  BP: 120/72  Pulse: 62  Resp: 18  Temp: 98.3 F (36.8 C)   There is no height or weight on file to calculate BMI. Physical Exam  Constitutional: She is oriented to person, place, and time. She appears well-developed and well-nourished. No distress.  HENT:  Head: Normocephalic and atraumatic.  Right Ear: External ear normal.  Left Ear: External ear normal.  Nose: Nose normal.  Mouth/Throat: Oropharynx is clear and moist. No oropharyngeal exudate.  Eyes: Conjunctivae and EOM are normal. Pupils are equal, round, and reactive to light. Right eye exhibits no discharge. Left eye exhibits no discharge. No scleral icterus.  Neck: Normal range of motion. Neck supple. No JVD present. No tracheal deviation present. No thyromegaly present.  Cardiovascular: Normal rate, regular rhythm, normal heart sounds  and intact distal pulses.  Exam reveals no friction rub.   No murmur heard. Pulmonary/Chest: Effort normal and breath sounds normal. No stridor. No respiratory distress. She has no wheezes. She has no rales. She exhibits no tenderness.  Abdominal: Soft. Bowel sounds are normal. She exhibits no distension. There is no tenderness. There is no rebound and no guarding.  Musculoskeletal: Normal range of motion. She exhibits edema and tenderness.  Lower back pain refers to the right thigh and knee--positional, Tylenol was requested Only trace edema seen in the left ankle.   Lymphadenopathy:    She has no cervical adenopathy.  Neurological: She is alert and oriented to person, place, and time. She has normal reflexes. No cranial nerve deficit. She exhibits normal muscle tone. Coordination abnormal.  Skin: Skin is warm and dry. No rash noted. She is not diaphoretic. No erythema. No pallor.  Discolored fingernails. Skin tear R elbow, no s/s of infection.   Psychiatric: She has a normal mood and affect. Her behavior is normal. Judgment and thought content normal.  Forgetful, stated she slept better    Labs reviewed:  Recent Labs  02/01/16 02/27/16 06/25/16  NA 134* 134* 134*  K 3.6 3.8 3.6  BUN 2* 18 19  CREATININE 0.7 0.6 0.6    Recent Labs  02/01/16 02/27/16 06/25/16  AST 8* 8* 8*  ALT 3* 3* 3*  ALKPHOS 47 48 92    Recent Labs  02/01/16 02/27/16 06/25/16  WBC 7.3 12.6 5.6  HGB 11.0* 10.7* 10.9*  HCT 33* 33* 34*  PLT 240 296 263   Lab Results  Component Value Date   TSH 1.46 04/25/2015   No results found for: HGBA1C Lab Results  Component Value Date   CHOL 199 04/24/2012   HDL 69.30 04/24/2012   LDLCALC 105 (H) 04/24/2012   LDLDIRECT 101.1 04/16/2011   TRIG 122.0 04/24/2012   CHOLHDL 3 04/24/2012    Significant Diagnostic Results in last 30 days:  No results found.  Assessment/Plan Essential hypertension Permissive blood pressure control, continue Hydralazine 25mg   bid since 10/05/15. 06/25/16 Na 134, K 3.6, Bun 19, creat 0.63, wbc 5.6, Hgb 10.9, amylase 38, lipase20    GERD (gastroesophageal reflux disease) stable, continue Famotidine,  Zofran 4mg  prn   Nausea with vomiting 06/25/16 Na 134, K 3.6, Bun 19, creat 0.63, wbc 5.6, Hgb 10.9, amylase 38, lipase20 stable  Constipation Stable,  continue prn MiraLax  Osteoarthritis Multiple sites, prn Tylenol is adequate to east aches and pains.   Parkinsonian tremor (HCC) Continue Sinemet 25/100 tid, flat affect,  no disabling tremor, SNF for care needs.    Adjustment disorder with anxious mood Mood is stabilized   Edema Not apparent, off Furosemide  Anemia Stable, Hgb 10-11, continue Vit B12 1067mcg po daily. 06/25/16 Hgb 10.9  Memory deficit Mild, forgetful, continue SNF for care needs.    Adult failure to thrive 02/01/16 TP 4.7, albumin 2.7, continue supportive care.      Family/ staff Communication: continue SNF for care needs.   Labs/tests ordered: none

## 2016-08-21 NOTE — Assessment & Plan Note (Signed)
Mood is stabilized

## 2016-08-21 NOTE — Assessment & Plan Note (Signed)
02/01/16 TP 4.7, albumin 2.7, continue supportive care.

## 2016-08-21 NOTE — Assessment & Plan Note (Signed)
Mild, forgetful, continue SNF for care needs.

## 2016-08-21 NOTE — Assessment & Plan Note (Signed)
Not apparent, off Furosemide

## 2016-08-21 NOTE — Assessment & Plan Note (Signed)
Multiple sites, prn Tylenol is adequate to east aches and pains.

## 2016-08-21 NOTE — Assessment & Plan Note (Signed)
06/25/16 Na 134, K 3.6, Bun 19, creat 0.63, wbc 5.6, Hgb 10.9, amylase 38, lipase20 stable

## 2016-08-21 NOTE — Assessment & Plan Note (Signed)
Continue Sinemet 25/100 tid, flat affect,  no disabling tremor, SNF for care needs.

## 2016-08-21 NOTE — Assessment & Plan Note (Signed)
stable, continue Famotidine,  Zofran 4mg  prn

## 2016-08-21 NOTE — Assessment & Plan Note (Addendum)
Permissive blood pressure control, continue Hydralazine 25mg  bid since 10/05/15. 06/25/16 Na 134, K 3.6, Bun 19, creat 0.63, wbc 5.6, Hgb 10.9, amylase 38, lipase20

## 2016-08-21 NOTE — Assessment & Plan Note (Signed)
Stable, Hgb 10-11, continue Vit B12 1050mcg po daily. 06/25/16 Hgb 10.9

## 2016-08-23 ENCOUNTER — Non-Acute Institutional Stay (SKILLED_NURSING_FACILITY): Payer: Medicare Other | Admitting: Nurse Practitioner

## 2016-08-23 ENCOUNTER — Encounter: Payer: Self-pay | Admitting: Nurse Practitioner

## 2016-08-23 DIAGNOSIS — I10 Essential (primary) hypertension: Secondary | ICD-10-CM

## 2016-08-23 DIAGNOSIS — R413 Other amnesia: Secondary | ICD-10-CM

## 2016-08-23 DIAGNOSIS — K5900 Constipation, unspecified: Secondary | ICD-10-CM

## 2016-08-23 DIAGNOSIS — R609 Edema, unspecified: Secondary | ICD-10-CM | POA: Diagnosis not present

## 2016-08-23 DIAGNOSIS — D518 Other vitamin B12 deficiency anemias: Secondary | ICD-10-CM

## 2016-08-23 DIAGNOSIS — T148XXA Other injury of unspecified body region, initial encounter: Secondary | ICD-10-CM

## 2016-08-23 DIAGNOSIS — T148 Other injury of unspecified body region: Secondary | ICD-10-CM | POA: Diagnosis not present

## 2016-08-23 DIAGNOSIS — M150 Primary generalized (osteo)arthritis: Secondary | ICD-10-CM | POA: Diagnosis not present

## 2016-08-23 DIAGNOSIS — D509 Iron deficiency anemia, unspecified: Secondary | ICD-10-CM

## 2016-08-23 DIAGNOSIS — D649 Anemia, unspecified: Secondary | ICD-10-CM

## 2016-08-23 DIAGNOSIS — K219 Gastro-esophageal reflux disease without esophagitis: Secondary | ICD-10-CM | POA: Diagnosis not present

## 2016-08-23 DIAGNOSIS — G20 Parkinson's disease: Secondary | ICD-10-CM | POA: Diagnosis not present

## 2016-08-23 DIAGNOSIS — M159 Polyosteoarthritis, unspecified: Secondary | ICD-10-CM

## 2016-08-23 NOTE — Assessment & Plan Note (Signed)
Stable, Hgb 10-11, continue Vit B12 1040mcg po daily. 06/25/16 Hgb 10.9

## 2016-08-23 NOTE — Assessment & Plan Note (Signed)
Mild, forgetful, continue SNF for care needs. 07/05/16 MMSE 16/30

## 2016-08-23 NOTE — Assessment & Plan Note (Signed)
Stable, Hgb 10-11, continue Vit B12 1080mcg po daily. 06/25/16 Hgb 10.9

## 2016-08-23 NOTE — Assessment & Plan Note (Signed)
Right lateral calf, well approximated, no s/s of infection. Protective sleeve for fragile skin.

## 2016-08-23 NOTE — Progress Notes (Signed)
Location:   [place] of Service:   01 Provider:  Blakely Maranan, [place]xie  NP  Jeanmarie Hubert, MD  Patient Care Team: Estill Dooms, MD as PCP - General (Internal Medicine) Lafayette Dragon, MD (Gastroenterology) Penni Bombard, MD (Neurology) Rolm Bookbinder, MD as Attending Physician (Dermatology) Clent Jacks, MD (Ophthalmology) Belva Crome, MD as Consulting Physician (Cardiology) Evans Lance, MD as Consulting Physician (Cardiology) Reyne Dumas, MD as Consulting Physician (Internal Medicine) Suella Broad, MD as Consulting Physician (Physical Medicine and Rehabilitation) Yacoub Diltz Otho Darner, NP as Nurse Practitioner (Nurse Practitioner)  Extended Emergency Contact Information Primary Emergency Contact: [facility] Address: [place], [place] [address] Johnnette Litter of [place] Phone: [phone] Relation: Other Secondary Emergency Contact: [name] Address: [address]          [place], [place] [address] Johnnette Litter of [place] Phone: [phone] Mobile Phone: [phone] Relation: Daughter  Code Status:  DNR Goals of care: Advanced Directive information Advanced Directives 08/23/2016  Does patient have an advance directive? Yes  Type of Paramedic of [place];Out of facility DNR (pink MOST or yellow form)  Does patient want to make changes to advanced directive? No - Patient declined  Copy of advanced directive(s) in chart? Yes  Would patient like information on creating an advanced directive? -  Pre-existing out of facility DNR order (yellow form or pink MOST form) -     Chief Complaint  Patient presents with  . Acute Visit    Skin tear to right lateral calf, family requesting geri sleeves    HPI:  Pt is a 82 y.o. female seen today for an acute visit for right lateral calf skin tear, well approximated, no s/s of infection.                 Hx of depression, stable while on Sertraline 50mg ,  blood pressure is controlled on Hydrolazine 25mg  bid, Parkinson's, mild tremor in hands, not disabling while on Sinemet 25/100 tid.     Past Medical History:  Diagnosis Date  . Adenomatous colon polyp   . ANEMIA, B12 DEFICIENCY 03/13/2009  . Anxiety   . Benign neoplasm of colon 07/19/2013  . Blood in stool 01/25/2008  . Candidiasis of skin 10/09/2015  . Clostridium difficile colitis 02/05/2016   02/01/16 C-diff GDG detected, C-diff toxin A/B not detected x3, Flagyl 500mg  q8h x 10 days started since 02/02/16 in setting of diarrhea. Observe the patient.    . Complete heart block ([place]) 08/22/2014  . Compression fracture of L3 lumbar vertebra ([place]) 12/14/2014   Patient received epidural steroid injection last week for this Patient has been told that she is not a candidate for surgery for lumbar spinal stenosis Does not like Vicodin, recommend continuing tramadol and Tylenol for pain control PT/OT eval recommended SNF    . Constipation   . Decreased hearing 04/17/2011  . Edema   . FREQUENCY, URINARY 08/19/2008  . Gastric polyp    panendo  5 2009  . GERD (gastroesophageal reflux disease)   . Hearing loss   . Hemorrhoid   . History of ankle fracture   . HYPERLIPIDEMIA 02/21/2009  . HYPERTENSION 05/14/2007  . HYPOKALEMIA 08/17/2007  . Iron deficiency anemia   . Lumbar compression fracture ([place])   . LUMBAR STRAIN 09/21/2007  . Memory deficit 06/05/2015  . OSTEOARTHRITIS 05/14/2007  . OSTEOPENIA 05/14/2007   2007 hx of fosamax use   .  Parkinson's disease   . Psoriasis   . TREMOR 07/08/2007   Past Surgical History:  Procedure Laterality Date  . ABDOMINAL HYSTERECTOMY  1974  . BREAST CYST ASPIRATION    . CATARACT EXTRACTION, BILATERAL  [address]  . COLONOSCOPY N/A 07/19/2013   Procedure: COLONOSCOPY;  Surgeon: Lafayette Dragon, MD;  Location: [facility];  Service: Endoscopy;  Laterality: N/A;  . DILATION AND CURETTAGE OF UTERUS    . fracture  2007   left ankle- Hx of fosamax use    No Known Allergies      Medication List       Accurate as of 08/23/16  3:38 PM. Always use your most recent med list.          acetaminophen 325 MG tablet Commonly known as:  TYLENOL Take 650 mg by mouth every 6 (six) hours as needed for mild pain, moderate pain or fever.   carbidopa-levodopa 25-100 MG tablet Commonly known as:  SINEMET IR Take 1 tablet by mouth 3 (three) times daily.   famotidine 20 MG tablet Commonly known as:  PEPCID Take 20 mg by mouth. 1 at bedtime to reduce stomach acid   nystatin-triamcinolone cream Commonly known as:  MYCOLOG II Apply 1 application topically 2 (two) times daily. Right and left groin and intergluteal cleft until redness healed   ondansetron 4 MG tablet Commonly known as:  ZOFRAN Take 4 mg by mouth. Take one tablet every 6 hours as needed   polyethylene glycol packet Commonly known as:  MIRALAX / GLYCOLAX Take 17 g by mouth daily as needed for mild constipation.   SYSTANE 0.4-0.3 % Soln Generic drug:  Polyethyl Glycol-Propyl Glycol Apply 1 drop to eye 2 (two) times daily.   vitamin B-12 1000 MCG tablet Commonly known as:  CYANOCOBALAMIN Take 1,000 mcg by mouth daily. One daily for B 12 supplement       Review of Systems  Constitutional: Negative for chills, diaphoresis and fever.       Gradual weight loss  HENT: Positive for hearing loss. Negative for congestion, ear pain and nosebleeds.   Eyes: Negative for pain, discharge and redness.  Respiratory: Negative for cough, shortness of breath and wheezing.   Cardiovascular: Positive for leg swelling. Negative for chest pain and palpitations.       Much improved, on trace edema in her left ankle seen  Gastrointestinal: Negative for constipation, diarrhea, nausea and vomiting.       Occasional nausea  Genitourinary: Positive for frequency. Negative for dysuria, flank pain and urgency.  Musculoskeletal: Positive for back pain. Negative for myalgias and neck pain.       Right lower back pain with  walking or weight bearing on and off, positional.   Skin: Negative for rash.       Skin tear lateral right calf, well approximated, no s/s of infection.   Neurological: Positive for tremors. Negative for dizziness, seizures, weakness and headaches.  Psychiatric/Behavioral: The patient is nervous/anxious.     Immunization History  Administered Date(s) Administered  . Influenza Split 09/26/2011, 09/29/2012  . Influenza Whole 09/21/2007, 09/27/2008, 09/12/2009, 08/17/2010  . Influenza, High Dose Seasonal PF 09/22/2013  . Influenza,inj,Quad PF,36+ Mos 10/03/2014  . Influenza-Unspecified 09/06/2015  . PPD Test 06/01/2015  . Pneumococcal Conjugate-13 01/05/2014  . Pneumococcal Polysaccharide-23 12/17/1995  . Tdap 04/16/2011  . Zoster 08/27/2011   Pertinent  Health Maintenance Due  Topic Date Due  . INFLUENZA VACCINE  07/16/2016  . DEXA SCAN  Completed  .  PNA vac Low Risk Adult  Completed   Fall Risk  06/24/2016 10/05/2015 06/05/2015 03/23/2015 02/23/2015  Falls in the past year? Yes No No Yes Yes  Number falls in past yr: 1 - - 1 1  Injury with Fall? No - - Yes No  Risk for fall due to : - - - Impaired balance/gait -   Functional Status Survey:    Vitals:   08/23/16 1313  BP: 132/80  Pulse: 64  Resp: 20  Temp: 99.5 F (37.5 C)  Weight: 92 lb 1.6 oz (41.8 kg)  Height: 5\' 1"  (1.549 m)   Body mass index is 17.4 kg/m. Physical Exam  Labs reviewed:  Recent Labs  02/01/16 02/27/16 06/25/16  NA 134* 134* 134*  K 3.6 3.8 3.6  BUN 2* 18 19  CREATININE 0.7 0.6 0.6    Recent Labs  02/01/16 02/27/16 06/25/16  AST 8* 8* 8*  ALT 3* 3* 3*  ALKPHOS 47 48 92    Recent Labs  02/01/16 02/27/16 06/25/16  WBC 7.3 12.6 5.6  HGB 11.0* 10.7* 10.9*  HCT 33* 33* 34*  PLT 240 296 263   Lab Results  Component Value Date   TSH 1.46 04/25/2015   No results found for: HGBA1C Lab Results  Component Value Date   CHOL 199 04/24/2012   HDL 69.30 04/24/2012   LDLCALC 105 (H)  04/24/2012   LDLDIRECT 101.1 04/16/2011   TRIG 122.0 04/24/2012   CHOLHDL 3 04/24/2012    Significant Diagnostic Results in last 30 days:  No results found.  Assessment/Plan There are no diagnoses linked to this encounter.Essential hypertension Permissive blood pressure control, continue Hydralazine 25mg  bid since 10/05/15. 06/25/16 Na 134, K 3.6, Bun 19, creat 0.63, wbc 5.6, Hgb 10.9, amylase 38, lipase20   GERD (gastroesophageal reflux disease) stable, continue Famotidine, prn Zofran.     Constipation Stable, continue prn MiraLax   Osteoarthritis Multiple sites, prn Tylenol is adequate to east aches and pains.    Parkinsonian tremor (HCC) Continue Sinemet 25/100 tid, flat affect,  no disabling tremor, SNF for care needs.   Edema Not apparent, off Furosemide  Anemia Stable, Hgb 10-11, continue Vit B12 1055mcg po daily. 06/25/16 Hgb 10.9  Memory deficit Mild, forgetful, continue SNF for care needs. 07/05/16 MMSE 16/30    ANEMIA, B12 DEFICIENCY Stable, Hgb 10-11, continue Vit B12 1067mcg po daily. 06/25/16 Hgb 10.9   IDA (iron deficiency anemia) Stable, Hgb 10-11, continue Vit B12 1074mcg po daily. 06/25/16 Hgb 10.9   Abrasion of skin Right lateral calf, well approximated, no s/s of infection. Protective sleeve for fragile skin.      Family/ staff Communication: continue SNF for care assistance   Labs/tests ordered:  none

## 2016-08-23 NOTE — Assessment & Plan Note (Signed)
stable, continue Famotidine, prn Zofran.

## 2016-08-23 NOTE — Assessment & Plan Note (Signed)
Not apparent, off Furosemide

## 2016-08-23 NOTE — Assessment & Plan Note (Signed)
Multiple sites, prn Tylenol is adequate to east aches and pains.

## 2016-08-23 NOTE — Assessment & Plan Note (Signed)
Continue Sinemet 25/100 tid, flat affect,  no disabling tremor, SNF for care needs.

## 2016-08-23 NOTE — Assessment & Plan Note (Signed)
Stable, Hgb 10-11, continue Vit B12 1031mcg po daily. 06/25/16 Hgb 10.9

## 2016-08-23 NOTE — Assessment & Plan Note (Signed)
Permissive blood pressure control, continue Hydralazine 25mg  bid since 10/05/15. 06/25/16 Na 134, K 3.6, Bun 19, creat 0.63, wbc 5.6, Hgb 10.9, amylase 38, lipase20

## 2016-08-23 NOTE — Assessment & Plan Note (Signed)
Stable, continue prn MiraLax 

## 2016-09-02 ENCOUNTER — Non-Acute Institutional Stay (SKILLED_NURSING_FACILITY): Payer: Medicare Other | Admitting: Nurse Practitioner

## 2016-09-02 ENCOUNTER — Encounter: Payer: Self-pay | Admitting: *Deleted

## 2016-09-02 DIAGNOSIS — B372 Candidiasis of skin and nail: Secondary | ICD-10-CM

## 2016-09-02 DIAGNOSIS — K219 Gastro-esophageal reflux disease without esophagitis: Secondary | ICD-10-CM | POA: Diagnosis not present

## 2016-09-02 DIAGNOSIS — R609 Edema, unspecified: Secondary | ICD-10-CM

## 2016-09-02 DIAGNOSIS — I10 Essential (primary) hypertension: Secondary | ICD-10-CM | POA: Diagnosis not present

## 2016-09-02 DIAGNOSIS — F4322 Adjustment disorder with anxiety: Secondary | ICD-10-CM

## 2016-09-02 DIAGNOSIS — K5900 Constipation, unspecified: Secondary | ICD-10-CM

## 2016-09-02 DIAGNOSIS — R413 Other amnesia: Secondary | ICD-10-CM

## 2016-09-02 DIAGNOSIS — D649 Anemia, unspecified: Secondary | ICD-10-CM | POA: Diagnosis not present

## 2016-09-02 DIAGNOSIS — G20 Parkinson's disease: Secondary | ICD-10-CM

## 2016-09-02 NOTE — Assessment & Plan Note (Signed)
Mood is stabilized, not on meds.

## 2016-09-02 NOTE — Assessment & Plan Note (Signed)
Mild, forgetful, continue SNF for care needs. 07/05/16 MMSE 16/30

## 2016-09-02 NOTE — Assessment & Plan Note (Signed)
Continue Sinemet 25/100 tid, flat affect,  no disabling tremor, SNF for care needs.

## 2016-09-02 NOTE — Assessment & Plan Note (Signed)
Not apparent, off Furosemide

## 2016-09-02 NOTE — Assessment & Plan Note (Signed)
Stable, continue prn MiraLax 

## 2016-09-02 NOTE — Assessment & Plan Note (Signed)
Stable, Hgb 10-11, continue Vit B12 1029mcg po daily. 06/25/16 Hgb 10.9

## 2016-09-02 NOTE — Assessment & Plan Note (Signed)
stable, continue Famotidine, prn Zofran

## 2016-09-02 NOTE — Progress Notes (Signed)
Location:  [place] Room Number: 1 Place of Service:  SNF (31) Provider:  Mast, Manxie  NP  Jeanmarie Hubert, MD  Patient Care Team: Estill Dooms, MD as PCP - General (Internal Medicine) Lafayette Dragon, MD (Inactive) (Gastroenterology) Penni Bombard, MD (Neurology) Rolm Bookbinder, MD as Attending Physician (Dermatology) Clent Jacks, MD (Ophthalmology) Belva Crome, MD as Consulting Physician (Cardiology) Evans Lance, MD as Consulting Physician (Cardiology) Reyne Dumas, MD as Consulting Physician (Internal Medicine) Suella Broad, MD as Consulting Physician (Physical Medicine and Rehabilitation) [place] Otho Darner, NP as Nurse Practitioner (Nurse Practitioner)  Extended Emergency Contact Information Primary Emergency Contact: [facility] Address: [place], [place] [address] Johnnette Litter of [place] Phone: [phone] Relation: Other Secondary Emergency Contact: [name] Address: [address]          [place], [place] [address] Johnnette Litter of [place] Phone: [phone] Mobile Phone: [phone] Relation: Daughter  Code Status:  DNR Goals of care: Advanced Directive information Advanced Directives 09/02/2016  Does patient have an advance directive? Yes  Type of Paramedic of [place];Out of facility DNR (pink MOST or yellow form)  Does patient want to make changes to advanced directive? No - Patient declined  Copy of advanced directive(s) in chart? Yes  Would patient like information on creating an advanced directive? -  Pre-existing out of facility DNR order (yellow form or pink MOST form) -     Chief Complaint  Patient presents with  . Acute Visit    redness to groin area, nystatin is not working    HPI:  Pt is a 82 y.o. female seen today for an acute visit for groin redness, not responding to Nystatin/Triamcinolone cream.    Hx of depression, stable while on Sertraline  50mg , blood pressure is controlled on Hydrolazine 25mg  bid, Parkinson's, mild tremor in hands, not disabling while on Sinemet 25/100 tid.     Past Medical History:  Diagnosis Date  . Adenomatous colon polyp   . ANEMIA, B12 DEFICIENCY 03/13/2009  . Anxiety   . Benign neoplasm of colon 07/19/2013  . Blood in stool 01/25/2008  . Candidiasis of skin 10/09/2015  . Clostridium difficile colitis 02/05/2016   02/01/16 C-diff GDG detected, C-diff toxin A/B not detected x3, Flagyl 500mg  q8h x 10 days started since 02/02/16 in setting of diarrhea. Observe the patient.    . Complete heart block ([place]) 08/22/2014  . Compression fracture of L3 lumbar vertebra ([place]) 12/14/2014   Patient received epidural steroid injection last week for this Patient has been told that she is not a candidate for surgery for lumbar spinal stenosis Does not like Vicodin, recommend continuing tramadol and Tylenol for pain control PT/OT eval recommended SNF    . Constipation   . Decreased hearing 04/17/2011  . Edema   . FREQUENCY, URINARY 08/19/2008  . Gastric polyp    panendo  5 2009  . GERD (gastroesophageal reflux disease)   . Hearing loss   . Hemorrhoid   . History of ankle fracture   . HYPERLIPIDEMIA 02/21/2009  . HYPERTENSION 05/14/2007  . HYPOKALEMIA 08/17/2007  . Iron deficiency anemia   . Lumbar compression fracture ([place])   . LUMBAR STRAIN 09/21/2007  . Memory deficit 06/05/2015  . OSTEOARTHRITIS 05/14/2007  . OSTEOPENIA 05/14/2007   2007 hx of fosamax use   . Parkinson's disease   . Psoriasis   . TREMOR 07/08/2007  Past Surgical History:  Procedure Laterality Date  . ABDOMINAL HYSTERECTOMY  1974  . BREAST CYST ASPIRATION    . CATARACT EXTRACTION, BILATERAL  [address]  . COLONOSCOPY N/A 07/19/2013   Procedure: COLONOSCOPY;  Surgeon: Lafayette Dragon, MD;  Location: [facility];  Service: Endoscopy;  Laterality: N/A;  . DILATION AND CURETTAGE OF UTERUS    . fracture  2007   left ankle- Hx of fosamax use    No Known  Allergies    Medication List       Accurate as of 09/02/16 12:20 PM. Always use your most recent med list.          acetaminophen 325 MG tablet Commonly known as:  TYLENOL Take 650 mg by mouth every 6 (six) hours as needed for mild pain, moderate pain or fever.   carbidopa-levodopa 25-100 MG tablet Commonly known as:  SINEMET IR Take 1 tablet by mouth 3 (three) times daily.   famotidine 20 MG tablet Commonly known as:  PEPCID Take 20 mg by mouth. 1 at bedtime to reduce stomach acid   nystatin-triamcinolone cream Commonly known as:  MYCOLOG II Apply 1 application topically 2 (two) times daily. Right and left groin and intergluteal cleft until redness healed   ondansetron 4 MG tablet Commonly known as:  ZOFRAN Take 4 mg by mouth. Take one tablet every 6 hours as needed   polyethylene glycol packet Commonly known as:  MIRALAX / GLYCOLAX Take 17 g by mouth daily as needed for mild constipation.   SYSTANE 0.4-0.3 % Soln Generic drug:  Polyethyl Glycol-Propyl Glycol Apply 1 drop to eye 2 (two) times daily.   vitamin B-12 1000 MCG tablet Commonly known as:  CYANOCOBALAMIN Take 1,000 mcg by mouth daily. One daily for B 12 supplement       Review of Systems  Constitutional: Negative for chills, diaphoresis and fever.       Gradual weight loss  HENT: Positive for hearing loss. Negative for congestion, ear pain and nosebleeds.   Eyes: Negative for pain, discharge and redness.  Respiratory: Negative for cough, shortness of breath and wheezing.   Cardiovascular: Positive for leg swelling. Negative for chest pain and palpitations.       Much improved, on trace edema in her left ankle seen  Gastrointestinal: Negative for constipation, diarrhea, nausea and vomiting.       Occasional nausea  Genitourinary: Positive for frequency. Negative for dysuria, flank pain and urgency.  Musculoskeletal: Positive for back pain. Negative for myalgias and neck pain.       Right lower back  pain with walking or weight bearing on and off, positional.   Skin: Negative for rash.       Groin redness  Neurological: Positive for tremors. Negative for dizziness, seizures, weakness and headaches.  Psychiatric/Behavioral: The patient is nervous/anxious.     Immunization History  Administered Date(s) Administered  . Influenza Split 09/26/2011, 09/29/2012  . Influenza Whole 09/21/2007, 09/27/2008, 09/12/2009, 08/17/2010  . Influenza, High Dose Seasonal PF 09/22/2013  . Influenza,inj,Quad PF,36+ Mos 10/03/2014  . Influenza-Unspecified 09/06/2015  . PPD Test 06/01/2015  . Pneumococcal Conjugate-13 01/05/2014  . Pneumococcal Polysaccharide-23 12/17/1995  . Tdap 04/16/2011  . Zoster 08/27/2011   Pertinent  Health Maintenance Due  Topic Date Due  . INFLUENZA VACCINE  07/16/2016  . DEXA SCAN  Completed  . PNA vac Low Risk Adult  Completed   Fall Risk  06/24/2016 10/05/2015 06/05/2015 03/23/2015 02/23/2015  Falls in the past year? Yes No  No Yes Yes  Number falls in past yr: 1 - - 1 1  Injury with Fall? No - - Yes No  Risk for fall due to : - - - Impaired balance/gait -   Functional Status Survey:    Vitals:   09/02/16 1137  BP: 132/80  Pulse: 64  Resp: 20  Temp: 99.5 F (37.5 C)  Weight: 99 lb 8 oz (45.1 kg)  Height: 5\' 1"  (1.549 m)   Body mass index is 18.8 kg/m. Physical Exam  Constitutional: She is oriented to person, place, and time. She appears well-developed and well-nourished. No distress.  HENT:  Head: Normocephalic and atraumatic.  Right Ear: External ear normal.  Left Ear: External ear normal.  Nose: Nose normal.  Mouth/Throat: Oropharynx is clear and moist. No oropharyngeal exudate.  Eyes: Conjunctivae and EOM are normal. Pupils are equal, round, and reactive to light. Right eye exhibits no discharge. Left eye exhibits no discharge. No scleral icterus.  Neck: Normal range of motion. Neck supple. No JVD present. No tracheal deviation present. No thyromegaly  present.  Cardiovascular: Normal rate, regular rhythm, normal heart sounds and intact distal pulses.  Exam reveals no friction rub.   No murmur heard. Pulmonary/Chest: Effort normal and breath sounds normal. No stridor. No respiratory distress. She has no wheezes. She has no rales. She exhibits no tenderness.  Abdominal: Soft. Bowel sounds are normal. She exhibits no distension. There is no tenderness. There is no rebound and no guarding.  Musculoskeletal: Normal range of motion. She exhibits edema and tenderness.  Lower back pain refers to the right thigh and knee--positional, Tylenol was requested Only trace edema seen in the left ankle.   Lymphadenopathy:    She has no cervical adenopathy.  Neurological: She is alert and oriented to person, place, and time. She has normal reflexes. No cranial nerve deficit. She exhibits normal muscle tone. Coordination abnormal.  Skin: Skin is warm and dry. No rash noted. She is not diaphoretic. No erythema. No pallor.  Discolored fingernails. Groin redness  Psychiatric: She has a normal mood and affect. Her behavior is normal. Judgment and thought content normal.  Forgetful, stated she slept better    Labs reviewed:  Recent Labs  02/01/16 02/27/16 06/25/16  NA 134* 134* 134*  K 3.6 3.8 3.6  BUN 2* 18 19  CREATININE 0.7 0.6 0.6    Recent Labs  02/01/16 02/27/16 06/25/16  AST 8* 8* 8*  ALT 3* 3* 3*  ALKPHOS 47 48 92    Recent Labs  02/01/16 02/27/16 06/25/16  WBC 7.3 12.6 5.6  HGB 11.0* 10.7* 10.9*  HCT 33* 33* 34*  PLT 240 296 263   Lab Results  Component Value Date   TSH 1.46 04/25/2015   No results found for: HGBA1C Lab Results  Component Value Date   CHOL 199 04/24/2012   HDL 69.30 04/24/2012   LDLCALC 105 (H) 04/24/2012   LDLDIRECT 101.1 04/16/2011   TRIG 122.0 04/24/2012   CHOLHDL 3 04/24/2012    Significant Diagnostic Results in last 30 days:  No results found.  Assessment/Plan There are no diagnoses linked to this  encounter.   Family/ staff Communication: continue SNF for care assistance.   Labs/tests ordered:  none

## 2016-09-02 NOTE — Assessment & Plan Note (Signed)
Groin R+L, redness, not responding to Mycolog II cream, will treat with Diflucan 100mg  po qd x 3 days. Continue assist the patient with personal hygiene.

## 2016-09-02 NOTE — Assessment & Plan Note (Signed)
Permissive blood pressure control, continue Hydralazine 25mg  bid since 10/05/15. 06/25/16 Na 134, K 3.6, Bun 19, creat 0.63, wbc 5.6, Hgb 10.9, amylase 38, lipase20

## 2016-09-23 ENCOUNTER — Encounter: Payer: Self-pay | Admitting: Internal Medicine

## 2016-09-23 ENCOUNTER — Non-Acute Institutional Stay (SKILLED_NURSING_FACILITY): Payer: Medicare Other | Admitting: Internal Medicine

## 2016-09-23 DIAGNOSIS — N390 Urinary tract infection, site not specified: Secondary | ICD-10-CM | POA: Diagnosis not present

## 2016-09-23 DIAGNOSIS — K219 Gastro-esophageal reflux disease without esophagitis: Secondary | ICD-10-CM | POA: Diagnosis not present

## 2016-09-23 DIAGNOSIS — R627 Adult failure to thrive: Secondary | ICD-10-CM | POA: Diagnosis not present

## 2016-09-23 DIAGNOSIS — R413 Other amnesia: Secondary | ICD-10-CM | POA: Diagnosis not present

## 2016-09-23 DIAGNOSIS — R112 Nausea with vomiting, unspecified: Secondary | ICD-10-CM | POA: Diagnosis not present

## 2016-09-23 DIAGNOSIS — D649 Anemia, unspecified: Secondary | ICD-10-CM | POA: Diagnosis not present

## 2016-09-23 DIAGNOSIS — I10 Essential (primary) hypertension: Secondary | ICD-10-CM

## 2016-09-23 NOTE — Progress Notes (Signed)
Progress Note    Location:  [facility] Room Number: N01 Place of Service:  SNF [phone]) Provider:  Jeanmarie Hubert, MD  Patient Care Team: Estill Dooms, MD as PCP - General (Internal Medicine) Lafayette Dragon, MD (Inactive) (Gastroenterology) Penni Bombard, MD (Neurology) Rolm Bookbinder, MD as Attending Physician (Dermatology) Clent Jacks, MD (Ophthalmology) Belva Crome, MD as Consulting Physician (Cardiology) Evans Lance, MD as Consulting Physician (Cardiology) Reyne Dumas, MD as Consulting Physician (Internal Medicine) Suella Broad, MD as Consulting Physician (Physical Medicine and Rehabilitation) Man Otho Darner, NP as Nurse Practitioner (Nurse Practitioner)  Extended Emergency Contact Information Primary Emergency Contact: [facility] Address: [place], [place] [address] Johnnette Litter of [place] Phone: [phone] Relation: Other Secondary Emergency Contact: [name] Address: [address]          [place], [place] [address] Johnnette Litter of [place] Phone: [phone] Mobile Phone: [phone] Relation: Daughter  Code Status:  DNR Goals of care: Advanced Directive information Advanced Directives 09/23/2016  Does patient have an advance directive? Yes  Type of Paramedic of [place];Out of facility DNR (pink MOST or yellow form)  Does patient want to make changes to advanced directive? -  Copy of advanced directive(s) in chart? Yes  Would patient like information on creating an advanced directive? -  Pre-existing out of facility DNR order (yellow form or pink MOST form) Yellow form placed in chart (order not valid for inpatient use)     Chief Complaint  Patient presents with  . Medical Management of Chronic Issues  . Acute Visit    nausea    HPI:  Pt is a 82 y.o. female seen today for medical management of chronic diseases.    Non-intractable vomiting with nausea,  unspecified vomiting type - had issues with this in June, but seemed better after a while. For the last few days, she has had a relapse in symptoms. No vomiting yet.  Anemia, unspecified type - persistent  Essential hypertension - controlled  Memory deficit - unchanged  Gastroesophageal reflux disease without esophagitis - nauseous, but denies reflux.  Adult failure to thrive - weight down 7# since jan 2017. Remains on [facility].  Urinary tract infection without hematuria, site unspecified - mild dysuria. Has had 2 UTI in the last 4 months.     Past Medical History:  Diagnosis Date  . Adenomatous colon polyp   . ANEMIA, B12 DEFICIENCY 03/13/2009  . Anxiety   . Benign neoplasm of colon 07/19/2013  . Blood in stool 01/25/2008  . Candidiasis of skin 10/09/2015  . Clostridium difficile colitis 02/05/2016   02/01/16 C-diff GDG detected, C-diff toxin A/B not detected x3, Flagyl 500mg  q8h x 10 days started since 02/02/16 in setting of diarrhea. Observe the patient.    . Complete heart block ([place]) 08/22/2014  . Compression fracture of L3 lumbar vertebra ([place]) 12/14/2014   Patient received epidural steroid injection last week for this Patient has been told that she is not a candidate for surgery for lumbar spinal stenosis Does not like Vicodin, recommend continuing tramadol and Tylenol for pain control PT/OT eval recommended SNF    . Constipation   . Decreased hearing 04/17/2011  . Edema   . FREQUENCY, URINARY 08/19/2008  . Gastric polyp    panendo  5 2009  . GERD (gastroesophageal reflux disease)   . Hearing loss   . Hemorrhoid   .  History of ankle fracture   . HYPERLIPIDEMIA 02/21/2009  . HYPERTENSION 05/14/2007  . HYPOKALEMIA 08/17/2007  . Iron deficiency anemia   . Lumbar compression fracture ([place])   . LUMBAR STRAIN 09/21/2007  . Memory deficit 06/05/2015  . OSTEOARTHRITIS 05/14/2007  . OSTEOPENIA 05/14/2007   2007 hx of fosamax use   . Parkinson's disease   . Psoriasis   . TREMOR 07/08/2007    Past Surgical History:  Procedure Laterality Date  . ABDOMINAL HYSTERECTOMY  1974  . BREAST CYST ASPIRATION    . CATARACT EXTRACTION, BILATERAL  [address]  . COLONOSCOPY N/A 07/19/2013   Procedure: COLONOSCOPY;  Surgeon: Lafayette Dragon, MD;  Location: [facility];  Service: Endoscopy;  Laterality: N/A;  . DILATION AND CURETTAGE OF UTERUS    . fracture  2007   left ankle- Hx of fosamax use    No Known Allergies    Medication List       Accurate as of 09/23/16 11:25 AM. Always use your most recent med list.          acetaminophen 325 MG tablet Commonly known as:  TYLENOL Take 650 mg by mouth every 6 (six) hours as needed for mild pain, moderate pain or fever.   carbidopa-levodopa 25-100 MG tablet Commonly known as:  SINEMET IR Take 1 tablet by mouth 3 (three) times daily.   famotidine 20 MG tablet Commonly known as:  PEPCID Take 20 mg by mouth. 1 at bedtime to reduce stomach acid   nystatin-triamcinolone cream Commonly known as:  MYCOLOG II Apply 1 application topically 2 (two) times daily. Right and left groin and intergluteal cleft until redness healed   ondansetron 4 MG tablet Commonly known as:  ZOFRAN Take 4 mg by mouth. Take one tablet every morning and  every 6 hours as needed for n/v   polyethylene glycol packet Commonly known as:  MIRALAX / GLYCOLAX Take 17 g by mouth daily as needed for mild constipation.   SYSTANE 0.4-0.3 % Soln Generic drug:  Polyethyl Glycol-Propyl Glycol Apply 1 drop to eye 2 (two) times daily.   vitamin B-12 1000 MCG tablet Commonly known as:  CYANOCOBALAMIN Take 1,000 mcg by mouth daily. One daily for B 12 supplement       Review of Systems  Constitutional: Negative for chills, diaphoresis and fever.       Gradual weight loss  HENT: Positive for hearing loss. Negative for congestion, ear pain and nosebleeds.   Eyes: Negative for pain, discharge and redness.  Respiratory: Negative for cough, shortness of breath and wheezing.    Cardiovascular: Positive for leg swelling. Negative for chest pain and palpitations.       Much improved, on trace edema in her left ankle seen  Gastrointestinal: Positive for nausea. Negative for constipation, diarrhea and vomiting.       Occasional nausea  Genitourinary: Positive for dysuria and frequency. Negative for flank pain and urgency.       Recurrent UTI  Musculoskeletal: Positive for back pain. Negative for myalgias and neck pain.       Right lower back pain with walking or weight bearing on and off, positional.   Skin: Negative for rash.       Groin redness  Neurological: Positive for tremors. Negative for dizziness, seizures, weakness and headaches.  Psychiatric/Behavioral: The patient is nervous/anxious.     Immunization History  Administered Date(s) Administered  . Influenza Split 09/26/2011, 09/29/2012  . Influenza Whole 09/21/2007, 09/27/2008, 09/12/2009, 08/17/2010  . Influenza,  High Dose Seasonal PF 09/22/2013  . Influenza,inj,Quad PF,36+ Mos 10/03/2014  . Influenza-Unspecified 09/06/2015  . PPD Test 06/01/2015  . Pneumococcal Conjugate-13 01/05/2014  . Pneumococcal Polysaccharide-23 12/17/1995  . Tdap 04/16/2011  . Zoster 08/27/2011   Pertinent  Health Maintenance Due  Topic Date Due  . INFLUENZA VACCINE  07/16/2016  . DEXA SCAN  Completed  . PNA vac Low Risk Adult  Completed   Fall Risk  06/24/2016 10/05/2015 06/05/2015 03/23/2015 02/23/2015  Falls in the past year? Yes No No Yes Yes  Number falls in past yr: 1 - - 1 1  Injury with Fall? No - - Yes No  Risk for fall due to : - - - Impaired balance/gait -   Functional Status Survey:    Vitals:   09/23/16 1109  Weight: 92 lb 4.8 oz (41.9 kg)  Height: 5\' 1"  (1.549 m)   Body mass index is 17.44 kg/m. Physical Exam  Constitutional: She is oriented to person, place, and time. She appears well-developed. No distress.  Thin and under nourished in appearance.  HENT:  Head: Normocephalic and atraumatic.   Right Ear: External ear normal.  Left Ear: External ear normal.  Nose: Nose normal.  Mouth/Throat: Oropharynx is clear and moist. No oropharyngeal exudate.  Eyes: Conjunctivae and EOM are normal. Pupils are equal, round, and reactive to light. Right eye exhibits no discharge. Left eye exhibits no discharge. No scleral icterus.  Neck: Normal range of motion. Neck supple. No JVD present. No tracheal deviation present. No thyromegaly present.  Cardiovascular: Normal rate, regular rhythm, normal heart sounds and intact distal pulses.  Exam reveals no friction rub.   No murmur heard. Pulmonary/Chest: Effort normal and breath sounds normal. No stridor. No respiratory distress. She has no wheezes. She has no rales. She exhibits no tenderness.  Abdominal: Soft. Bowel sounds are normal. She exhibits no distension. There is no tenderness. There is no rebound and no guarding.  Musculoskeletal: Normal range of motion. She exhibits edema and tenderness.  Lower back pain refers to the right thigh and knee--positional, Tylenol was requested Only trace edema seen in the left ankle.   Lymphadenopathy:    She has no cervical adenopathy.  Neurological: She is alert and oriented to person, place, and time. She has normal reflexes. No cranial nerve deficit. She exhibits normal muscle tone. Coordination abnormal.  Skin: Skin is warm and dry. No rash noted. She is not diaphoretic. No erythema. No pallor.  Discolored fingernails. Groin redness  Psychiatric: She has a normal mood and affect. Her behavior is normal. Judgment and thought content normal.  Forgetful, stated she slept better    Labs reviewed:  Recent Labs  02/01/16 02/27/16 06/25/16  NA 134* 134* 134*  K 3.6 3.8 3.6  BUN 2* 18 19  CREATININE 0.7 0.6 0.6    Recent Labs  02/01/16 02/27/16 06/25/16  AST 8* 8* 8*  ALT 3* 3* 3*  ALKPHOS 47 48 92    Recent Labs  02/01/16 02/27/16 06/25/16  WBC 7.3 12.6 5.6  HGB 11.0* 10.7* 10.9*  HCT 33*  33* 34*  PLT 240 296 263   Lab Results  Component Value Date   TSH 1.46 04/25/2015   No results found for: HGBA1C Lab Results  Component Value Date   CHOL 199 04/24/2012   HDL 69.30 04/24/2012   LDLCALC 105 (H) 04/24/2012   LDLDIRECT 101.1 04/16/2011   TRIG 122.0 04/24/2012   CHOLHDL 3 04/24/2012    Significant Diagnostic Results  in last 30 days:  No results found.  Assessment/Plan 1. Non-intractable vomiting with nausea, unspecified vomiting type -lipase, amylase, CMP, cbc  2. Anemia, unspecified type CBC  3. Essential hypertension controlled  4. Memory deficit unchanged  5. Gastroesophageal reflux disease without esophagitis Remain on Pepcid  6. Adult failure to thrive observe  7. Urinary tract infection without hematuria, site unspecified -UA and culture

## 2016-09-24 LAB — BASIC METABOLIC PANEL
BUN: 14 mg/dL (ref 4–21)
Creatinine: 0.6 mg/dL (ref <=1.1)
Glucose: 71 mg/dL
POTASSIUM: 3 mmol/L — AB (ref 3.4–5.3)
SODIUM: 135 mmol/L — AB (ref 137–147)

## 2016-09-24 LAB — CBC AND DIFFERENTIAL
HEMATOCRIT: 36 % (ref 36–46)
HEMOGLOBIN: 12 g/dL (ref 12.0–16.0)
Platelets: 241 10*3/uL (ref 150–399)
WBC: 6.6 10^3/mL

## 2016-09-24 LAB — HEPATIC FUNCTION PANEL
ALK PHOS: 39 U/L (ref 25–125)
ALT: 3 U/L — AB (ref 7–35)
AST: 8 U/L — AB (ref 13–35)
BILIRUBIN, TOTAL: 0.5 mg/dL

## 2016-09-26 ENCOUNTER — Other Ambulatory Visit: Payer: Self-pay | Admitting: *Deleted

## 2016-10-24 ENCOUNTER — Encounter: Payer: Self-pay | Admitting: Nurse Practitioner

## 2016-10-24 ENCOUNTER — Non-Acute Institutional Stay (SKILLED_NURSING_FACILITY): Payer: Medicare Other | Admitting: Nurse Practitioner

## 2016-10-24 DIAGNOSIS — I10 Essential (primary) hypertension: Secondary | ICD-10-CM | POA: Diagnosis not present

## 2016-10-24 DIAGNOSIS — G20 Parkinson's disease: Secondary | ICD-10-CM | POA: Diagnosis not present

## 2016-10-24 DIAGNOSIS — R627 Adult failure to thrive: Secondary | ICD-10-CM | POA: Diagnosis not present

## 2016-10-24 DIAGNOSIS — R609 Edema, unspecified: Secondary | ICD-10-CM

## 2016-10-24 DIAGNOSIS — K5900 Constipation, unspecified: Secondary | ICD-10-CM

## 2016-10-24 DIAGNOSIS — K219 Gastro-esophageal reflux disease without esophagitis: Secondary | ICD-10-CM | POA: Diagnosis not present

## 2016-10-24 DIAGNOSIS — R413 Other amnesia: Secondary | ICD-10-CM | POA: Diagnosis not present

## 2016-10-24 DIAGNOSIS — F4322 Adjustment disorder with anxiety: Secondary | ICD-10-CM

## 2016-10-24 DIAGNOSIS — D649 Anemia, unspecified: Secondary | ICD-10-CM

## 2016-10-24 NOTE — Assessment & Plan Note (Signed)
Mild, forgetful, continue SNF for care needs. 07/05/16 MMSE 16/30

## 2016-10-24 NOTE — Assessment & Plan Note (Signed)
Mood is stabilized, not on meds.

## 2016-10-24 NOTE — Assessment & Plan Note (Signed)
Continue supportive and comfort care.

## 2016-10-24 NOTE — Assessment & Plan Note (Signed)
Chronic nausea, vomiting,  Continue Famotidine, Ondansetron, comfort care

## 2016-10-24 NOTE — Assessment & Plan Note (Signed)
Not apparent, off Furosemide

## 2016-10-24 NOTE — Assessment & Plan Note (Signed)
09/24/16 Hgb 12.0

## 2016-10-24 NOTE — Assessment & Plan Note (Signed)
Continue Sinemet 25/100 tid, flat affect,  no disabling tremor, SNF for care needs.

## 2016-10-24 NOTE — Progress Notes (Signed)
Location:  [facility] Room Number: 1 Place of Service:  SNF (31) Provider:  Leata Dominy, Manxie  NP  Jeanmarie Hubert, MD  Patient Care Team: Estill Dooms, MD as PCP - General (Internal Medicine) Lafayette Dragon, MD (Inactive) (Gastroenterology) Penni Bombard, MD (Neurology) Rolm Bookbinder, MD as Attending Physician (Dermatology) Clent Jacks, MD (Ophthalmology) Belva Crome, MD as Consulting Physician (Cardiology) Evans Lance, MD as Consulting Physician (Cardiology) Reyne Dumas, MD as Consulting Physician (Internal Medicine) Suella Broad, MD as Consulting Physician (Physical Medicine and Rehabilitation) Tziporah Knoke Otho Darner, NP as Nurse Practitioner (Nurse Practitioner)  Extended Emergency Contact Information Primary Emergency Contact: [facility] Address: [place], [place] [address] Johnnette Litter of [place] Phone: [phone] Relation: Other Secondary Emergency Contact: [name] Address: [address]          [place], [place] [address] Johnnette Litter of [place] Phone: [phone] Mobile Phone: [phone] Relation: Daughter  Code Status:  DNR Goals of care: Advanced Directive information Advanced Directives 10/24/2016  Does patient have an advance directive? Yes  Type of Paramedic of [place];Out of facility DNR (pink MOST or yellow form)  Does patient want to make changes to advanced directive? No - Patient declined  Copy of advanced directive(s) in chart? Yes  Would patient like information on creating an advanced directive? -  Pre-existing out of facility DNR order (yellow form or pink MOST form) -     Chief Complaint  Patient presents with  . Medical Management of Chronic Issues    HPI:  Pt is a 82 y.o. female seen today for medical management of chronic diseases.    Hx of depression, off Sertraline 50mg , blood pressure is controlled off Hydrolazine 25mg  bid, Parkinson's, mild  tremor in hands, not disabling while on Sinemet 25/100 tid. GERD/vomiting, managed with Ondansetron, Famotidine  Past Medical History:  Diagnosis Date  . Adenomatous colon polyp   . ANEMIA, B12 DEFICIENCY 03/13/2009  . Anxiety   . Benign neoplasm of colon 07/19/2013  . Blood in stool 01/25/2008  . Candidiasis of skin 10/09/2015  . Clostridium difficile colitis 02/05/2016   02/01/16 C-diff GDG detected, C-diff toxin A/B not detected x3, Flagyl 500mg  q8h x 10 days started since 02/02/16 in setting of diarrhea. Observe the patient.    . Complete heart block ([place]) 08/22/2014  . Compression fracture of L3 lumbar vertebra ([place]) 12/14/2014   Patient received epidural steroid injection last week for this Patient has been told that she is not a candidate for surgery for lumbar spinal stenosis Does not like Vicodin, recommend continuing tramadol and Tylenol for pain control PT/OT eval recommended SNF    . Constipation   . Decreased hearing 04/17/2011  . Edema   . FREQUENCY, URINARY 08/19/2008  . Gastric polyp    panendo  5 2009  . GERD (gastroesophageal reflux disease)   . Hearing loss   . Hemorrhoid   . History of ankle fracture   . HYPERLIPIDEMIA 02/21/2009  . HYPERTENSION 05/14/2007  . HYPOKALEMIA 08/17/2007  . Iron deficiency anemia   . Lumbar compression fracture ([place])   . LUMBAR STRAIN 09/21/2007  . Memory deficit 06/05/2015  . OSTEOARTHRITIS 05/14/2007  . OSTEOPENIA 05/14/2007   2007 hx of fosamax use   . Parkinson's disease   . Psoriasis   . TREMOR 07/08/2007   Past Surgical History:  Procedure Laterality Date  . ABDOMINAL HYSTERECTOMY  1974  . BREAST CYST ASPIRATION    . CATARACT EXTRACTION, BILATERAL  [address]  . COLONOSCOPY N/A 07/19/2013   Procedure: COLONOSCOPY;  Surgeon: Lafayette Dragon, MD;  Location: [facility];  Service: Endoscopy;  Laterality: N/A;  . DILATION AND CURETTAGE OF UTERUS    . fracture  2007   left ankle- Hx of fosamax use    No Known Allergies    Medication List         Accurate as of 10/24/16  4:13 PM. Always use your most recent med list.          acetaminophen 325 MG tablet Commonly known as:  TYLENOL Take 650 mg by mouth every 6 (six) hours as needed for mild pain, moderate pain or fever.   carbidopa-levodopa 25-100 MG tablet Commonly known as:  SINEMET IR Take 1 tablet by mouth 3 (three) times daily.   famotidine 20 MG tablet Commonly known as:  PEPCID Take 20 mg by mouth. 1 at bedtime to reduce stomach acid   nystatin-triamcinolone cream Commonly known as:  MYCOLOG II Apply 1 application topically 2 (two) times daily. Right and left groin and intergluteal cleft until redness healed   ondansetron 4 MG tablet Commonly known as:  ZOFRAN Take 4 mg by mouth. Take one tablet every morning and  every 6 hours as needed for n/v   polyethylene glycol packet Commonly known as:  MIRALAX / GLYCOLAX Take 17 g by mouth daily as needed for mild constipation.   SYSTANE 0.4-0.3 % Soln Generic drug:  Polyethyl Glycol-Propyl Glycol Apply 1 drop to eye 2 (two) times daily.   vitamin B-12 1000 MCG tablet Commonly known as:  CYANOCOBALAMIN Take 1,000 mcg by mouth daily. One daily for B 12 supplement       Review of Systems  Constitutional: Negative for chills, diaphoresis and fever.       Gradual weight loss  HENT: Positive for hearing loss. Negative for congestion, ear pain and nosebleeds.   Eyes: Negative for pain, discharge and redness.  Respiratory: Negative for cough, shortness of breath and wheezing.   Cardiovascular: Positive for leg swelling. Negative for chest pain and palpitations.       Much improved, on trace edema in her left ankle seen  Gastrointestinal: Positive for nausea. Negative for constipation, diarrhea and vomiting.       Occasional nausea  Genitourinary: Positive for dysuria and frequency. Negative for flank pain and urgency.       Recurrent UTI  Musculoskeletal: Positive for back pain. Negative for myalgias and neck pain.        Right lower back pain with walking or weight bearing on and off, positional.   Skin: Negative for rash.       Groin redness  Neurological: Positive for tremors. Negative for dizziness, seizures, weakness and headaches.  Psychiatric/Behavioral: The patient is nervous/anxious.     Immunization History  Administered Date(s) Administered  . Influenza Split 09/26/2011, 09/29/2012  . Influenza Whole 09/21/2007, 09/27/2008, 09/12/2009, 08/17/2010  . Influenza, High Dose Seasonal PF 09/22/2013  . Influenza,inj,Quad PF,36+ Mos 10/03/2014  . Influenza-Unspecified 09/06/2015, 10/01/2016  . PPD Test 06/01/2015  . Pneumococcal Conjugate-13 01/05/2014  . Pneumococcal Polysaccharide-23 12/17/1995  . Tdap 04/16/2011  . Zoster 08/27/2011   Pertinent  Health Maintenance Due  Topic Date Due  . INFLUENZA VACCINE  Completed  . DEXA SCAN  Completed  . PNA vac Low Risk Adult  Completed   Fall Risk  06/24/2016 10/05/2015 06/05/2015 03/23/2015 02/23/2015  Falls in the past year? Yes No No Yes Yes  Number falls in past yr: 1 - - 1 1  Injury with Fall? No - - Yes No  Risk for fall due to : - - - Impaired balance/gait -   Functional Status Survey:    Vitals:   10/24/16 1333  BP: 130/86  Pulse: 76  Resp: 18  Temp: 98.1 F (36.7 C)  Weight: 92 lb 4.8 oz (41.9 kg)  Height: 5\' 1"  (1.549 m)   Body mass index is 17.44 kg/m. Physical Exam  Constitutional: She is oriented to person, place, and time. She appears well-developed. No distress.  Thin and under nourished in appearance.  HENT:  Head: Normocephalic and atraumatic.  Right Ear: External ear normal.  Left Ear: External ear normal.  Nose: Nose normal.  Mouth/Throat: Oropharynx is clear and moist. No oropharyngeal exudate.  Eyes: Conjunctivae and EOM are normal. Pupils are equal, round, and reactive to light. Right eye exhibits no discharge. Left eye exhibits no discharge. No scleral icterus.  Neck: Normal range of motion. Neck supple. No  JVD present. No tracheal deviation present. No thyromegaly present.  Cardiovascular: Normal rate, regular rhythm, normal heart sounds and intact distal pulses.  Exam reveals no friction rub.   No murmur heard. Pulmonary/Chest: Effort normal and breath sounds normal. No stridor. No respiratory distress. She has no wheezes. She has no rales. She exhibits no tenderness.  Abdominal: Soft. Bowel sounds are normal. She exhibits no distension. There is no tenderness. There is no rebound and no guarding.  Musculoskeletal: Normal range of motion. She exhibits edema and tenderness.  Lower back pain refers to the right thigh and knee--positional, Tylenol was requested Only trace edema seen in the left ankle.   Lymphadenopathy:    She has no cervical adenopathy.  Neurological: She is alert and oriented to person, place, and time. She has normal reflexes. No cranial nerve deficit. She exhibits normal muscle tone. Coordination abnormal.  Skin: Skin is warm and dry. No rash noted. She is not diaphoretic. No erythema. No pallor.  Discolored fingernails. Groin redness  Psychiatric: She has a normal mood and affect. Her behavior is normal. Judgment and thought content normal.  Forgetful, stated she slept better    Labs reviewed:  Recent Labs  02/27/16 06/25/16 09/24/16  NA 134* 134* 135*  K 3.8 3.6 3.0*  BUN 18 19 14   CREATININE 0.6 0.6 0.6    Recent Labs  02/27/16 06/25/16 09/24/16  AST 8* 8* 8*  ALT 3* 3* 3*  ALKPHOS 48 92 39    Recent Labs  02/27/16 06/25/16 09/24/16  WBC 12.6 5.6 6.6  HGB 10.7* 10.9* 12.0  HCT 33* 34* 36  PLT 296 263 241   Lab Results  Component Value Date   TSH 1.46 04/25/2015   No results found for: HGBA1C Lab Results  Component Value Date   CHOL 199 04/24/2012   HDL 69.30 04/24/2012   LDLCALC 105 (H) 04/24/2012   LDLDIRECT 101.1 04/16/2011   TRIG 122.0 04/24/2012   CHOLHDL 3 04/24/2012    Significant Diagnostic Results in last 30 days:  No results  found.  Assessment/Plan Essential hypertension  Permissive controlled blood pressure, off Hydralazine 25mg  bid  GERD (gastroesophageal reflux disease) Chronic nausea, vomiting,  Continue Famotidine, Ondansetron, comfort care  Constipation Stable, continue MiraLax  Parkinsonian tremor (HCC) Continue Sinemet 25/100 tid, flat affect,  no disabling tremor, SNF for care needs.   Adjustment disorder with anxious mood Mood  is stabilized, not on meds.    Edema Not apparent, off Furosemide   Anemia 09/24/16 Hgb 12.0  Memory deficit Mild, forgetful, continue SNF for care needs. 07/05/16 MMSE 16/30  Adult failure to thrive Continue supportive and comfort care.      Family/ staff Communication: continue SNF for care assistance  Labs/tests ordered:  none

## 2016-10-24 NOTE — Assessment & Plan Note (Signed)
Permissive controlled blood pressure, off Hydralazine 25mg  bid

## 2016-10-24 NOTE — Assessment & Plan Note (Signed)
Stable, continue MiraLax.  °

## 2016-11-15 DEATH — deceased
